# Patient Record
Sex: Female | Born: 1950 | Hispanic: No | Marital: Married | Smoking: Never smoker
Health system: Southern US, Community
[De-identification: ages and names within clinical notes are randomized; demographics above are authoritative.]

## PROBLEM LIST (undated history)

## (undated) DIAGNOSIS — E079 Disorder of thyroid, unspecified: Secondary | ICD-10-CM

## (undated) DIAGNOSIS — K760 Fatty (change of) liver, not elsewhere classified: Secondary | ICD-10-CM

## (undated) DIAGNOSIS — M5410 Radiculopathy, site unspecified: Secondary | ICD-10-CM

## (undated) DIAGNOSIS — Z9989 Dependence on other enabling machines and devices: Secondary | ICD-10-CM

## (undated) DIAGNOSIS — T8859XA Other complications of anesthesia, initial encounter: Secondary | ICD-10-CM

## (undated) DIAGNOSIS — C4491 Basal cell carcinoma of skin, unspecified: Secondary | ICD-10-CM

## (undated) DIAGNOSIS — Z9289 Personal history of other medical treatment: Secondary | ICD-10-CM

## (undated) DIAGNOSIS — G4733 Obstructive sleep apnea (adult) (pediatric): Secondary | ICD-10-CM

## (undated) DIAGNOSIS — G629 Polyneuropathy, unspecified: Secondary | ICD-10-CM

## (undated) DIAGNOSIS — K5790 Diverticulosis of intestine, part unspecified, without perforation or abscess without bleeding: Secondary | ICD-10-CM

## (undated) DIAGNOSIS — E785 Hyperlipidemia, unspecified: Secondary | ICD-10-CM

## (undated) DIAGNOSIS — I10 Essential (primary) hypertension: Secondary | ICD-10-CM

## (undated) DIAGNOSIS — D6851 Activated protein C resistance: Secondary | ICD-10-CM

## (undated) DIAGNOSIS — I6529 Occlusion and stenosis of unspecified carotid artery: Secondary | ICD-10-CM

## (undated) DIAGNOSIS — I82401 Acute embolism and thrombosis of unspecified deep veins of right lower extremity: Secondary | ICD-10-CM

## (undated) HISTORY — DX: Personal history of other medical treatment: Z92.89

## (undated) HISTORY — DX: Diverticulosis of intestine, part unspecified, without perforation or abscess without bleeding: K57.90

## (undated) HISTORY — DX: Hyperlipidemia, unspecified: E78.5

## (undated) HISTORY — DX: Activated protein C resistance: D68.51

## (undated) HISTORY — PX: APPENDECTOMY: SHX54

## (undated) HISTORY — DX: Fatty (change of) liver, not elsewhere classified: K76.0

## (undated) HISTORY — DX: Basal cell carcinoma of skin, unspecified: C44.91

## (undated) HISTORY — DX: Essential (primary) hypertension: I10

## (undated) HISTORY — DX: Obstructive sleep apnea (adult) (pediatric): G47.33

## (undated) HISTORY — PX: COLONOSCOPY: SHX174

## (undated) HISTORY — DX: Polyneuropathy, unspecified: G62.9

## (undated) HISTORY — DX: Occlusion and stenosis of unspecified carotid artery: I65.29

## (undated) HISTORY — DX: Acute embolism and thrombosis of unspecified deep veins of right lower extremity: I82.401

## (undated) HISTORY — PX: BREAST SURGERY: SHX581

## (undated) HISTORY — PX: BACK SURGERY: SHX140

## (undated) HISTORY — DX: Disorder of thyroid, unspecified: E07.9

## (undated) HISTORY — PX: OTHER SURGICAL HISTORY: SHX169

## (undated) HISTORY — DX: Dependence on other enabling machines and devices: Z99.89

---

## 2003-05-29 HISTORY — PX: BREAST EXCISIONAL BIOPSY: SUR124

## 2016-10-13 LAB — HM COLONOSCOPY

## 2017-01-10 LAB — HM HEPATITIS C SCREENING LAB: HM Hepatitis Screen: NEGATIVE

## 2017-06-20 LAB — HM MAMMOGRAPHY

## 2017-11-01 ENCOUNTER — Ambulatory Visit: Payer: Self-pay | Admitting: Internal Medicine

## 2017-11-01 ENCOUNTER — Ambulatory Visit (INDEPENDENT_AMBULATORY_CARE_PROVIDER_SITE_OTHER): Payer: Medicare Other | Admitting: Internal Medicine

## 2017-11-01 ENCOUNTER — Encounter (INDEPENDENT_AMBULATORY_CARE_PROVIDER_SITE_OTHER): Payer: Self-pay

## 2017-11-01 VITALS — BP 168/80 | HR 77 | Temp 98.3°F | Ht 65.0 in | Wt 170.0 lb

## 2017-11-01 DIAGNOSIS — Z85828 Personal history of other malignant neoplasm of skin: Secondary | ICD-10-CM | POA: Diagnosis not present

## 2017-11-01 DIAGNOSIS — M85859 Other specified disorders of bone density and structure, unspecified thigh: Secondary | ICD-10-CM

## 2017-11-01 DIAGNOSIS — D6851 Activated protein C resistance: Secondary | ICD-10-CM | POA: Diagnosis not present

## 2017-11-01 DIAGNOSIS — I739 Peripheral vascular disease, unspecified: Secondary | ICD-10-CM

## 2017-11-01 DIAGNOSIS — I779 Disorder of arteries and arterioles, unspecified: Secondary | ICD-10-CM

## 2017-11-01 DIAGNOSIS — G4733 Obstructive sleep apnea (adult) (pediatric): Secondary | ICD-10-CM | POA: Diagnosis not present

## 2017-11-01 DIAGNOSIS — Z66 Do not resuscitate: Secondary | ICD-10-CM | POA: Diagnosis not present

## 2017-11-01 DIAGNOSIS — Z1283 Encounter for screening for malignant neoplasm of skin: Secondary | ICD-10-CM | POA: Diagnosis not present

## 2017-11-01 DIAGNOSIS — M8580 Other specified disorders of bone density and structure, unspecified site: Secondary | ICD-10-CM | POA: Insufficient documentation

## 2017-11-01 DIAGNOSIS — E039 Hypothyroidism, unspecified: Secondary | ICD-10-CM | POA: Diagnosis not present

## 2017-11-01 DIAGNOSIS — Z9989 Dependence on other enabling machines and devices: Secondary | ICD-10-CM

## 2017-11-01 DIAGNOSIS — E785 Hyperlipidemia, unspecified: Secondary | ICD-10-CM | POA: Diagnosis not present

## 2017-11-01 DIAGNOSIS — M810 Age-related osteoporosis without current pathological fracture: Secondary | ICD-10-CM

## 2017-11-01 DIAGNOSIS — I10 Essential (primary) hypertension: Secondary | ICD-10-CM | POA: Diagnosis not present

## 2017-11-01 HISTORY — DX: Personal history of other malignant neoplasm of skin: Z85.828

## 2017-11-01 HISTORY — DX: Activated protein C resistance: D68.51

## 2017-11-01 NOTE — Patient Instructions (Signed)
Follow up in 6 weeks  Let me know about eye doctor referral and when you are ready for sleep study referral   Neuropathic Pain Neuropathic pain is pain caused by damage to the nerves that are responsible for certain sensations in your body (sensory nerves). The pain can be caused by damage to:  The sensory nerves that send signals to your spinal cord and brain (peripheral nervous system).  The sensory nerves in your brain or spinal cord (central nervous system).  Neuropathic pain can make you more sensitive to pain. What would be a minor sensation for most people may feel very painful if you have neuropathic pain. This is usually a long-term condition that can be difficult to treat. The type of pain can differ from person to person. It may start suddenly (acute), or it may develop slowly and last for a long time (chronic). Neuropathic pain may come and go as damaged nerves heal or may stay at the same level for years. It often causes emotional distress, loss of sleep, and a lower quality of life. What are the causes? The most common cause of damage to a sensory nerve is diabetes. Many other diseases and conditions can also cause neuropathic pain. Causes of neuropathic pain can be classified as:  Toxic. Many drugs and chemicals can cause toxic damage. The most common cause of toxic neuropathic pain is damage from drug treatment for cancer (chemotherapy).  Metabolic. This type of pain can happen when a disease causes imbalances that damage nerves. Diabetes is the most common of these diseases. Vitamin B deficiency caused by long-term alcohol abuse is another common cause.  Traumatic. Any injury that cuts, crushes, or stretches a nerve can cause damage and pain. A common example is feeling pain after losing an arm or leg (phantom limb pain).  Compression-related. If a sensory nerve gets trapped or compressed for a long period of time, the blood supply to the nerve can be cut off.  Vascular. Many  blood vessel diseases can cause neuropathic pain by decreasing blood supply and oxygen to nerves.  Autoimmune. This type of pain results from diseases in which the body's defense system mistakenly attacks sensory nerves. Examples of autoimmune diseases that can cause neuropathic pain include lupus and multiple sclerosis.  Infectious. Many types of viral infections can damage sensory nerves and cause pain. Shingles infection is a common cause of this type of pain.  Inherited. Neuropathic pain can be a symptom of many diseases that are passed down through families (genetic).  What are the signs or symptoms? The main symptom is pain. Neuropathic pain is often described as:  Burning.  Shock-like.  Stinging.  Hot or cold.  Itching.  How is this diagnosed? No single test can diagnose neuropathic pain. Your health care provider will do a physical exam and ask you about your pain. You may use a pain scale to describe how bad your pain is. You may also have tests to see if you have a high sensitivity to pain and to help find the cause and location of any sensory nerve damage. These tests may include:  Imaging studies, such as: ? X-rays. ? CT scan. ? MRI.  Nerve conduction studies to test how well nerve signals travel through your sensory nerves (electrodiagnostic testing).  Stimulating your sensory nerves through electrodes on your skin and measuring the response in your spinal cord and brain (somatosensory evoked potentials).  How is this treated? Treatment for neuropathic pain may change over time. You  may need to try different treatment options or a combination of treatments. Some options include:  Over-the-counter pain relievers.  Prescription medicines. Some medicines used to treat other conditions may also help neuropathic pain. These include medicines to: ? Control seizures (anticonvulsants). ? Relieve depression (antidepressants).  Prescription-strength pain relievers  (narcotics). These are usually used when other pain relievers do not help.  Transcutaneous nerve stimulation (TENS). This uses electrical currents to block painful nerve signals. The treatment is painless.  Topical and local anesthetics. These are medicines that numb the nerves. They can be injected as a nerve block or applied to the skin.  Alternative treatments, such as: ? Acupuncture. ? Meditation. ? Massage. ? Physical therapy. ? Pain management programs. ? Counseling.  Follow these instructions at home:  Learn as much as you can about your condition.  Take medicines only as directed by your health care provider.  Work closely with all your health care providers to find what works best for you.  Have a good support system at home.  Consider joining a chronic pain support group. Contact a health care provider if:  Your pain treatments are not helping.  You are having side effects from your medicines.  You are struggling with fatigue, mood changes, depression, or anxiety. This information is not intended to replace advice given to you by your health care provider. Make sure you discuss any questions you have with your health care provider. Document Released: 02/09/2004 Document Revised: 12/02/2015 Document Reviewed: 10/22/2013 Elsevier Interactive Patient Education  [employer].

## 2017-11-01 NOTE — Progress Notes (Signed)
Pre visit review using our clinic review tool, if applicable. No additional management support is needed unless otherwise documented below in the visit note. 

## 2017-11-04 ENCOUNTER — Encounter: Payer: Self-pay | Admitting: Internal Medicine

## 2017-11-04 NOTE — Progress Notes (Addendum)
Chief Complaint  Patient presents with  . New Patient (Initial Visit)   New patient recently moved [place] to [place] daughter works for [employer] 1. HTN not on meds per pt BP is normal at home and when she comes to MD elevated  2. OSA on cpap will need new sleep study 01/2019 3. H/o BCC, SCC face would like referral to dermatology  4. HLD declines to be on statin due to h.o of elevated lfts on statin previously  5. She requests DNR form be filled out today and has necklace with DNR wishes she shows today  6. Hypothyroidism last labs 05/2017 in [place] per pt on levo 50 mcg qd  7. H/o back surgery with residual leg weakness and flare of back pain and radicular sx's she had PM&R doctor in [place] and taking gabapentin, prn valium and norco help with flares disc with pt is norco is long term will need pain clinic   Review of Systems  Constitutional: Negative for weight loss.  HENT: Negative for hearing loss.   Eyes: Negative for blurred vision.  Respiratory: Negative for shortness of breath.   Cardiovascular: Negative for chest pain.  Gastrointestinal: Negative for abdominal pain.  Musculoskeletal: Positive for back pain.  Skin: Negative for rash.  Neurological: Positive for sensory change and focal weakness. Negative for headaches.  Psychiatric/Behavioral: Negative for depression.   Past Medical History:  Diagnosis Date  . Carotid artery stenosis    mild L>R 03/26/16 [place]   . Diverticulosis    colonoscopy 10/13/16 [place]  . Factor 5 Leiden mutation, heterozygous ([place])   . History of blood transfusion    birth Rh factor   . Hyperlipidemia   . Hypertension   . Neuropathy    post surgical right leg s/p back surgery ruptured L4/5 disc repair with microdiscetomy   . OSA on CPAP   . Right leg DVT ([place])    2010 s/p back surgery   . Thyroid disease    hypothyroidism    Past Surgical History:  Procedure Laterality Date  . APPENDECTOMY     2000  . BREAST SURGERY     biospy ? year    . ruptured disc repair     L4/5 07/5571 with complications residual right leg weakness/reduced sensation and DVT in 2010 post surgery    Family History  Problem Relation Age of Onset  . Hypertension Mother   . Stroke Mother        in 52s   . Heart disease Father   . Stroke Father   . Cancer Brother        esophageal cancer   . Stroke Paternal Grandfather   . Hyperlipidemia Other    Social History   Socioeconomic History  . Marital status: Married    Spouse name: Not on file  . Number of children: Not on file  . Years of education: Not on file  . Highest education level: Not on file  Occupational History  . Not on file  Social Needs  . Financial resource strain: Not on file  . Food insecurity:    Worry: Not on file    Inability: Not on file  . Transportation needs:    Medical: Not on file    Non-medical: Not on file  Tobacco Use  . Smoking status: Never Smoker  . Smokeless tobacco: Never Used  Substance and Sexual Activity  . Alcohol use: Yes  . Drug use: Not Currently  . Sexual activity: Not on file  Lifestyle  . Physical activity:    Days per week: Not on file    Minutes per session: Not on file  . Stress: Not on file  Relationships  . Social connections:    Talks on phone: Not on file    Gets together: Not on file    Attends religious service: Not on file    Active member of club or organization: Not on file    Attends meetings of clubs or organizations: Not on file    Relationship status: Not on file  . Intimate partner violence:    Fear of current or ex partner: Not on file    Emotionally abused: Not on file    Physically abused: Not on file    Forced sexual activity: Not on file  Other Topics Concern  . Not on file  Social History Narrative   BSN CCM, RN   Moved from [place] recently    2 daughters    Retired and married    No guns, wears seat belt, safe in relationship    Current Meds  Medication Sig  . ALPHA LIPOIC ACID PO Take 250 mg by  mouth.  . Ascorbic Acid (VITAMIN C) 1000 MG tablet Take 1,000 mg by mouth 3 (three) times daily.  Marland Kitchen aspirin EC 81 MG tablet Take 81 mg by mouth daily.  . Cholecalciferol (VITAMIN D3) 5000 units CAPS Take by mouth.  . diazepam (VALIUM) 2 MG tablet Take 2 mg by mouth as needed for anxiety.  . gabapentin (NEURONTIN) 100 MG capsule Take 200 mg by mouth 3 (three) times daily.  Marland Kitchen HYDROcodone-acetaminophen (NORCO/VICODIN) 5-325 MG tablet Take 1 tablet by mouth once a week.   . levothyroxine (SYNTHROID, LEVOTHROID) 50 MCG tablet Take 50 mcg by mouth daily before breakfast.  . MAGNESIUM MALATE PO Take 625 mg by mouth.  . nitrofurantoin (MACRODANTIN) 50 MG capsule Take 50 mg by mouth at bedtime.  . Strontium Chloride POWD 500 mg by Does not apply route.   Allergies  Allergen Reactions  . Sulfa Antibiotics     Swelling     No results found for this or any previous visit (from the past 2160 hour(s)). Objective  Body mass index is 28.29 kg/m. Wt Readings from Last 3 Encounters:  11/01/17 170 lb (77.1 kg)   Temp Readings from Last 3 Encounters:  11/01/17 98.3 F (36.8 C) (Oral)   BP Readings from Last 3 Encounters:  11/01/17 (!) 168/80   Pulse Readings from Last 3 Encounters:  11/01/17 77    Physical Exam  Constitutional: She is oriented to person, place, and time. Vital signs are normal. She appears well-developed and well-nourished. She is cooperative.  HENT:  Head: Normocephalic and atraumatic.  Mouth/Throat: Oropharynx is clear and moist and mucous membranes are normal.  Eyes: Pupils are equal, round, and reactive to light. Conjunctivae are normal.  Cardiovascular: Normal rate, regular rhythm and normal heart sounds.  Pulmonary/Chest: Effort normal and breath sounds normal.  Neurological: She is alert and oriented to person, place, and time. Gait normal.  Walking normally w/o device today   Skin: Skin is warm, dry and intact.  Psychiatric: She has a normal mood and affect. Her  speech is normal and behavior is normal. Judgment and thought content normal. Cognition and memory are normal.  Nursing note and vitals reviewed.   Assessment   1. HTN uncontrolled today - pt states she has "white coat HTN" BP readings at home 120s-130s/80s Reviewed BP log 153/93  and 143/90 and 132/86 some of readings  2. Right leg neuropathy/lumbar radiculopathy postsurgical L4/5 ruptured discectomy 10/2008 with residual complications neuropathy and leg weakness right 3. Hypothyroidism  4. H/o UTI  5. OSA on cpap  6. HLD with FH HLD  7. Heterozygous factor 5 Leiden s/p DVT right leg 10/2008 s/p back surgery  8. HM HM Plan   1.  F/in 6 weeks  Requested labs from [facility] from 05/2017 prior PCP  If elevated at f/u will rec medication  2.  Cont meds  If narcotics needed long term will need to establish with pain clinic  Prev. PM&R doctor Dr. Marchelle Gearing [place] who tx'ed with "microcurrent" which helped  Gabapentin 200 mg 3x per day. Of note did not tolerate lyrica in the past  Also uses Valium 2 mg 1/2 tablet rarely for flares prn  norco 5-325 mg 1/2 to 1 tab 1x per week uses sparingly per pt   3. Cont levo 50 mcg  4. On macrobid prn disc tx dose 100 bid x 5 days but culture is best if having UTI sx's  4. Takes macrobid 50 mg qhs prn and D mannose  5. Will need repeat sleep study 01/2019 will need referral then  6. Declines to be on statin per pt h/o elevated lfts on statin in the past  7. Pt takes Aspirin 81 mg and nattokinase and has not had further events since 2010 I.e DVT/PE Declines anticoagulation unless another DVT/PE 8.  Declines flu vaccine, prevnar, pna 23 vaccine Had zostervax 2012 not shingrix Tdap had 2018 need to check prior PCP records   Out of age window pap h/o abnormal pap in 20s but normal since then  Colonoscopy had 10/13/16 diverticulosis reviewed letter will need report in future  -Colonoscopy 10/13/16 diverticulosis [facility] Dr. Cathlean Cower fax # [phone]   Mammogram had 06/20/17 normal reviewed letter will need report in future -Mammogram 06/20/17 normal [place] fax # [phone]   DEXA (h/o osteopenia hip and osteoporosis spine) get records prior PCP  -per pt this is why taking Strontium and had helped with osteoporosis   Refer to Dr. Santiago Glad tbse h/o BCC forehead and SCC nose needs tbse Will need eye exam in the future Never smoker   Filled out DNR form today per pt request and pt wears necklace with DNR.     Of note reviewed and scanned into chart -echo 11/26/12 normal except mild sclerosis AV mild, mild TR, MV leaflet thickening   Need to get records from [place] per pt had labs 05/2017  [facility] is pharmacy   Of note pt takes supplements  -nattokinase 100 mg qd (per pt taking for h/o factor V Leiden) -vitamin D3 5000 IU qd  -tumeric 1000 mg qd  -alpha lipoic acid 250 mg qd  -magnesium malate 625 qd  -strontium 500 mg qd  -vit C1000 mg 3x per day  -aspirin     Obtained records [facility] 11/09/17 dysuria suspected UTI dip +leuks 1+ urine culture + E coli pan sensitive    "I spent 45 minutes face-to face with patient with greater than 50% of time spent counseling and/or in coordination of care, reviewing medications and records patient brought into visit and PMH/PsuH in detail.   Provider: Dr. Olivia Mackie [facility]

## 2017-11-11 ENCOUNTER — Telehealth: Payer: Self-pay | Admitting: *Deleted

## 2017-11-11 NOTE — Telephone Encounter (Signed)
Copied from [place] [id number]. Topic: Inquiry >> Nov 11, 2017  8:03 AM Pricilla Handler wrote: Reason for CRM: Patient called stating that she was seen at a [facility] over the weekend for a Bladder Infection. Patient stated that she wants Dr. Olivia Mackie to call for and review the lab results from the Lab taken by [facility] and sent to [place]. Patient is requesting that Dr. Olivia Mackie prescribe her a medication based on the lab results from [facility]. Please call the patient this morning.       Thank You!!!

## 2017-11-12 NOTE — Telephone Encounter (Signed)
Spoken to Urgent care. Lab result have not returned back.

## 2017-11-24 ENCOUNTER — Telehealth: Payer: Self-pay | Admitting: Internal Medicine

## 2017-11-24 NOTE — Telephone Encounter (Signed)
I never got the phone message from 6/17 But just reviewed the note from [facility].   she did have UTI + E coli treatable by all antibiotics   I cant treat a patient unless I evaluate the patient.   [facility] who evaluated her for UTI should have sent antibiotics   Did they?   [place]

## 2017-11-26 NOTE — Telephone Encounter (Signed)
FYI

## 2017-11-26 NOTE — Telephone Encounter (Signed)
Spoken to [facility],  They sent in antibiotic for patinet on the 20 jun2019 to replace the macropbid that gave patient diarrhea.

## 2017-11-26 NOTE — Telephone Encounter (Signed)
Patient has been notified.  She had no questions at this time.

## 2017-11-26 NOTE — Telephone Encounter (Signed)
Patient would like Fransisco Beau to call her back [phone] if it is before 3, if not then call tomorrow

## 2017-12-02 ENCOUNTER — Telehealth: Payer: Self-pay | Admitting: Internal Medicine

## 2017-12-02 NOTE — Telephone Encounter (Signed)
I did not write Rx Cefdinir 300 mg bid x 1 week  Who wrote this and for what reason am I getting the fax call [phone] or [phone] If needed call the patient

## 2017-12-10 ENCOUNTER — Emergency Department
Admission: EM | Admit: 2017-12-10 | Discharge: 2017-12-10 | Disposition: A | Discharge status: Home / Self Care | Payer: Medicare Other | Attending: Emergency Medicine | Admitting: Emergency Medicine

## 2017-12-10 ENCOUNTER — Other Ambulatory Visit: Payer: Self-pay

## 2017-12-10 DIAGNOSIS — R300 Dysuria: Secondary | ICD-10-CM | POA: Insufficient documentation

## 2017-12-10 DIAGNOSIS — Z79899 Other long term (current) drug therapy: Secondary | ICD-10-CM | POA: Diagnosis not present

## 2017-12-10 DIAGNOSIS — E039 Hypothyroidism, unspecified: Secondary | ICD-10-CM | POA: Insufficient documentation

## 2017-12-10 DIAGNOSIS — Z7982 Long term (current) use of aspirin: Secondary | ICD-10-CM | POA: Insufficient documentation

## 2017-12-10 DIAGNOSIS — I10 Essential (primary) hypertension: Secondary | ICD-10-CM | POA: Diagnosis not present

## 2017-12-10 LAB — URINALYSIS, COMPLETE (UACMP) WITH MICROSCOPIC
Bacteria, UA: NONE SEEN
Bilirubin Urine: NEGATIVE
GLUCOSE, UA: NEGATIVE mg/dL
Hgb urine dipstick: NEGATIVE
KETONES UR: 5 mg/dL — AB
Leukocytes, UA: NEGATIVE
NITRITE: NEGATIVE
PH: 5 (ref 5.0–8.0)
Protein, ur: NEGATIVE mg/dL
SPECIFIC GRAVITY, URINE: 1.005 (ref 1.005–1.030)
SQUAMOUS EPITHELIAL / LPF: NONE SEEN (ref 0–5)
WBC, UA: NONE SEEN WBC/hpf (ref 0–5)

## 2017-12-10 LAB — CBC WITH DIFFERENTIAL/PLATELET
Basophils Absolute: 0.1 10*3/uL (ref 0–0.1)
Basophils Relative: 1 %
Eosinophils Absolute: 0.1 10*3/uL (ref 0–0.7)
Eosinophils Relative: 1 %
HEMATOCRIT: 37 % (ref 35.0–47.0)
Hemoglobin: 13.3 g/dL (ref 12.0–16.0)
LYMPHS ABS: 2.5 10*3/uL (ref 1.0–3.6)
LYMPHS PCT: 20 %
MCH: 32.5 pg (ref 26.0–34.0)
MCHC: 35.8 g/dL (ref 32.0–36.0)
MCV: 90.7 fL (ref 80.0–100.0)
MONOS PCT: 6 %
Monocytes Absolute: 0.8 10*3/uL (ref 0.2–0.9)
NEUTROS ABS: 8.8 10*3/uL — AB (ref 1.4–6.5)
Neutrophils Relative %: 72 %
Platelets: 291 10*3/uL (ref 150–440)
RBC: 4.08 MIL/uL (ref 3.80–5.20)
RDW: 12.3 % (ref 11.5–14.5)
WBC: 12.3 10*3/uL — ABNORMAL HIGH (ref 3.6–11.0)

## 2017-12-10 LAB — COMPREHENSIVE METABOLIC PANEL
ALK PHOS: 75 U/L (ref 38–126)
ALT: 24 U/L (ref 0–44)
AST: 27 U/L (ref 15–41)
Albumin: 4.2 g/dL (ref 3.5–5.0)
Anion gap: 10 (ref 5–15)
BUN: 16 mg/dL (ref 8–23)
CALCIUM: 9.2 mg/dL (ref 8.9–10.3)
CO2: 21 mmol/L — AB (ref 22–32)
CREATININE: 0.66 mg/dL (ref 0.44–1.00)
Chloride: 96 mmol/L — ABNORMAL LOW (ref 98–111)
Glucose, Bld: 115 mg/dL — ABNORMAL HIGH (ref 70–99)
Potassium: 3.6 mmol/L (ref 3.5–5.1)
Sodium: 127 mmol/L — ABNORMAL LOW (ref 135–145)
Total Bilirubin: 0.8 mg/dL (ref 0.3–1.2)
Total Protein: 7.3 g/dL (ref 6.5–8.1)

## 2017-12-10 MED ORDER — PHENAZOPYRIDINE HCL 95 MG PO TABS: 95.0000 mg | ORAL_TABLET | Freq: Three times a day (TID) | ORAL | 0 refills | Status: DC | PRN

## 2017-12-10 MED ORDER — SODIUM CHLORIDE 0.9 % IV SOLN
1000.0000 mL | Freq: Once | INTRAVENOUS | Status: AC
  Administered 2017-12-10: 1000 mL via INTRAVENOUS

## 2017-12-10 NOTE — ED Provider Notes (Signed)
[facility] Emergency Department Provider Note   ____________________________________________    I have reviewed the triage vital signs and the nursing notes.   HISTORY  Chief Complaint Decreased urination    HPI Tonya Green is a 67 y.o. female presents with decreased urination and mild dysuria.  Patient reports over the last month she has been treated multiple times for urinary tract infection.  She is frustrated because she just finished a course of Ceftdinir and reports that when she urinated in a glass cup today it looked cloudy she had some mild dysuria..  She reports she had a similar episode over a decade ago where she had multiple antibiotics with little improvement.  She does report some diarrhea from the antibiotics as well.  No fevers or chills.  She is concerned and came to the emergency department today because of decreased urination.  Past Medical History:  Diagnosis Date  . Carotid artery stenosis    mild L>R 03/26/16 [place]   . Diverticulosis    colonoscopy 10/13/16 [place]  . Factor 5 Leiden mutation, heterozygous ([place])   . History of blood transfusion    birth Rh factor   . Hyperlipidemia   . Hypertension   . Neuropathy    post surgical right leg s/p back surgery ruptured L4/5 disc repair with microdiscetomy   . OSA on CPAP   . Right leg DVT ([place])    2010 s/p back surgery   . Thyroid disease    hypothyroidism     Patient Active Problem List   Diagnosis Date Noted  . OSA on CPAP 11/01/2017  . History of skin cancer 11/01/2017  . Carotid artery disease ([place]) 11/01/2017  . HTN (hypertension) 11/01/2017  . HLD (hyperlipidemia) 11/01/2017  . Heterozygous factor V Leiden mutation ([place]) 11/01/2017  . Hypothyroidism 11/01/2017  . Osteopenia 11/01/2017  . Osteoporosis 11/01/2017    Past Surgical History:  Procedure Laterality Date  . APPENDECTOMY     2000  . BREAST SURGERY     biospy ? year   . ruptured disc repair     L4/5 09/6387 with complications residual right leg weakness/reduced sensation and DVT in 2010 post surgery     Prior to Admission medications   Medication Sig Start Date End Date Taking? Authorizing Provider  ALPHA LIPOIC ACID PO Take 250 mg by mouth.    [provider]  Ascorbic Acid (VITAMIN C) 1000 MG tablet Take 1,000 mg by mouth 3 (three) times daily.    [provider]  aspirin EC 81 MG tablet Take 81 mg by mouth daily.    [provider]  Cholecalciferol (VITAMIN D3) 5000 units CAPS Take by mouth.    [provider]  diazepam (VALIUM) 2 MG tablet Take 2 mg by mouth as needed for anxiety.    [provider]  gabapentin (NEURONTIN) 100 MG capsule Take 200 mg by mouth 3 (three) times daily.    [provider]  HYDROcodone-acetaminophen (NORCO/VICODIN) 5-325 MG tablet Take 1 tablet by mouth once a week.     [provider]  levothyroxine (SYNTHROID, LEVOTHROID) 50 MCG tablet Take 50 mcg by mouth daily before breakfast.    [provider]  MAGNESIUM MALATE PO Take 625 mg by mouth.    [provider]  nitrofurantoin (MACRODANTIN) 50 MG capsule Take 50 mg by mouth at bedtime.    [provider]  phenazopyridine (PYRIDIUM) 95 MG tablet Take 1 tablet (95 mg total) by  mouth 3 (three) times daily as needed for pain. 12/10/17   Lavonia Drafts, MD  Strontium Chloride POWD 500 mg by Does not apply route.    [provider]     Allergies Macrobid [nitrofurantoin macrocrystal] and Sulfa antibiotics  Family History  Problem Relation Age of Onset  . Hypertension Mother   . Stroke Mother        in 38s   . Heart disease Father   . Stroke Father   . Cancer Brother        esophageal cancer   . Stroke Paternal Grandfather   . Hyperlipidemia Other     Social History Social History   Tobacco Use  . Smoking status: Never Smoker  . Smokeless tobacco: Never Used  Substance Use Topics  . Alcohol use:  Yes  . Drug use: Never    Review of Systems  Constitutional: No fever/chills   Gastrointestinal: No abdominal pain.  No nausea, no vomiting.   Genitourinary: As above Musculoskeletal: Negative for back pain. Skin: Negative for rash. Neurological: Negative for headaches or weakness   ____________________________________________   PHYSICAL EXAM:  VITAL SIGNS: ED Triage Vitals  Enc Vitals Group     BP 12/10/17 1129 (!) 145/73     Pulse Rate 12/10/17 1129 75     Resp 12/10/17 1129 14     Temp 12/10/17 1129 98 F (36.7 C)     Temp Source 12/10/17 1129 Oral     SpO2 12/10/17 1129 98 %     Weight 12/10/17 1125 73.5 kg (162 lb)     Height 12/10/17 1125 1.651 m (5\' 5" )     Head Circumference --      Peak Flow --      Pain Score 12/10/17 1125 5     Pain Loc --      Pain Edu? --      Excl. in [place]? --     Constitutional: Alert and oriented. No acute distress. Pleasant and interactive Eyes: Conjunctivae are normal.   Nose: No congestion/rhinnorhea. Mouth/Throat: Mucous membranes are moist.    Cardiovascular: Normal rate, regular rhythm.   Good peripheral circulation. Respiratory: Normal respiratory effort.  No retractions.  Gastrointestinal: Soft and nontender. No distention.  No CVA tenderness.  No suprapubic tenderness palpation  Musculoskeletal:   Warm and well perfused Neurologic:  Normal speech and language. No gross focal neurologic deficits are appreciated.  Skin:  Skin is warm, dry and intact. No rash noted. Psychiatric: Mood and affect are normal. Speech and behavior are normal.  ____________________________________________   LABS (all labs ordered are listed, but only abnormal results are displayed)  Labs Reviewed  COMPREHENSIVE METABOLIC PANEL - Abnormal; Notable for the following components:      Result Value   Sodium 127 (*)    Chloride 96 (*)    CO2 21 (*)    Glucose, Bld 115 (*)    All other components within normal limits  URINALYSIS, COMPLETE  (UACMP) WITH MICROSCOPIC - Abnormal; Notable for the following components:   Color, Urine STRAW (*)    APPearance CLEAR (*)    Ketones, ur 5 (*)    All other components within normal limits  CBC WITH DIFFERENTIAL/PLATELET - Abnormal; Notable for the following components:   WBC 12.3 (*)    Neutro Abs 8.8 (*)    All other components within normal limits  URINE CULTURE   ____________________________________________  EKG   ____________________________________________  RADIOLOGY   ____________________________________________   PROCEDURES  Procedure(s) performed: No  Procedures   Critical Care performed: No ____________________________________________   INITIAL IMPRESSION / ASSESSMENT AND PLAN / ED COURSE  Pertinent labs & imaging results that were available during my care of the patient were reviewed by me and considered in my medical decision making (see chart for details).  Patient well-appearing in no acute distress.  Exam is quite reassuring.  Vital signs unremarkable.  Lab work demonstrates a mildly low sodium, discussed this with the patient.  This may be related to diarrhea secondary to antibiotic usage.,  She will follow-up with her PCP.  Urinalysis is completely normal, urine culture sent.  Patient received IV fluids and urinated.  She is reassured by this.  Outpatient follow-up/referral to urology    ____________________________________________   FINAL CLINICAL IMPRESSION(S) / ED DIAGNOSES  Final diagnoses:  Dysuria        Note:  This document was prepared using Dragon voice recognition software and may include unintentional dictation errors.    Lavonia Drafts, MD 12/10/17 1420

## 2017-12-10 NOTE — ED Triage Notes (Signed)
Pt c/o UTI symptoms - she states that she started with UTI June 17th - pt was seen by [facility] and had culture done and was placed on ATB x2 - when she completed ATB she started with cloudy urine and pain and called her PCP and got another ATB - the symptoms returned and she attempted herbal remedy without results - now she has had decreased urine output

## 2017-12-11 LAB — URINE CULTURE

## 2017-12-15 ENCOUNTER — Encounter: Payer: Self-pay | Admitting: Internal Medicine

## 2017-12-16 MED ORDER — LEVOTHYROXINE SODIUM 50 MCG PO TABS: 50.0000 ug | ORAL_TABLET | Freq: Every day | ORAL | 1 refills | Status: DC

## 2017-12-16 MED ORDER — GABAPENTIN 100 MG PO CAPS: 200.0000 mg | ORAL_CAPSULE | Freq: Three times a day (TID) | ORAL | 3 refills | Status: DC

## 2017-12-18 ENCOUNTER — Encounter: Payer: Self-pay | Admitting: Internal Medicine

## 2017-12-18 ENCOUNTER — Ambulatory Visit (INDEPENDENT_AMBULATORY_CARE_PROVIDER_SITE_OTHER): Payer: Medicare Other | Admitting: Internal Medicine

## 2017-12-18 VITALS — BP 164/84 | HR 75 | Temp 98.1°F | Ht 65.0 in | Wt 166.0 lb

## 2017-12-18 DIAGNOSIS — E871 Hypo-osmolality and hyponatremia: Secondary | ICD-10-CM | POA: Diagnosis not present

## 2017-12-18 DIAGNOSIS — M5416 Radiculopathy, lumbar region: Secondary | ICD-10-CM | POA: Diagnosis not present

## 2017-12-18 DIAGNOSIS — M62838 Other muscle spasm: Secondary | ICD-10-CM

## 2017-12-18 DIAGNOSIS — I10 Essential (primary) hypertension: Secondary | ICD-10-CM | POA: Diagnosis not present

## 2017-12-18 DIAGNOSIS — D72829 Elevated white blood cell count, unspecified: Secondary | ICD-10-CM

## 2017-12-18 DIAGNOSIS — E785 Hyperlipidemia, unspecified: Secondary | ICD-10-CM

## 2017-12-18 HISTORY — DX: Hypo-osmolality and hyponatremia: E87.1

## 2017-12-18 HISTORY — DX: Elevated white blood cell count, unspecified: D72.829

## 2017-12-18 LAB — BASIC METABOLIC PANEL
BUN: 12 mg/dL (ref 6–23)
CO2: 27 mEq/L (ref 19–32)
CREATININE: 0.72 mg/dL (ref 0.40–1.20)
Calcium: 9.5 mg/dL (ref 8.4–10.5)
Chloride: 104 mEq/L (ref 96–112)
GFR: 85.64 mL/min (ref >60.00)
Glucose, Bld: 102 mg/dL — ABNORMAL HIGH (ref 70–99)
POTASSIUM: 4 meq/L (ref 3.5–5.1)
Sodium: 137 mEq/L (ref 135–145)

## 2017-12-18 MED ORDER — DIAZEPAM 2 MG PO TABS: ORAL_TABLET | ORAL | 2 refills | Status: DC

## 2017-12-18 MED ORDER — AMLODIPINE BESYLATE 2.5 MG PO TABS: 2.5000 mg | ORAL_TABLET | Freq: Every day | ORAL | 3 refills | Status: DC

## 2017-12-18 NOTE — Progress Notes (Signed)
Chief Complaint  Patient presents with  . Follow-up   F/u  1. HTN BP elevated and log at home with BP readings 118-140s/70s-80s  2. ED visit for dysuria 12/10/17 with culture suggest recollection pt states sx's better after ingesting colloidal silver and macrobid x 2 doses caused sob so stopped. Na was 127. Sxs improved and urine looked more clear so declines to have urine checked today BP in ED 12/10/17 was 145/73 reviewed with pt in reference to #1 likely has HTN suggest meds 3. HLD declines statin   Review of Systems  Constitutional: Positive for weight loss.       Down 4 lbs   Respiratory: Negative for shortness of breath.   Cardiovascular: Negative for chest pain.  Genitourinary: Negative for dysuria.   Past Medical History:  Diagnosis Date  . Carotid artery stenosis    mild L>R 03/26/16 [place]   . Diverticulosis    colonoscopy 10/13/16 [place]  . Factor 5 Leiden mutation, heterozygous ([place])   . History of blood transfusion    birth Rh factor   . Hyperlipidemia   . Hypertension   . Neuropathy    post surgical right leg s/p back surgery ruptured L4/5 disc repair with microdiscetomy   . OSA on CPAP   . Right leg DVT ([place])    2010 s/p back surgery   . Thyroid disease    hypothyroidism    Past Surgical History:  Procedure Laterality Date  . APPENDECTOMY     2000  . BREAST SURGERY     biospy ? year   . ruptured disc repair     L4/5 0/7121 with complications residual right leg weakness/reduced sensation and DVT in 2010 post surgery    Family History  Problem Relation Age of Onset  . Hypertension Mother   . Stroke Mother        in 50s   . Heart disease Father   . Stroke Father   . Cancer Brother        esophageal cancer   . Stroke Paternal Grandfather   . Hyperlipidemia Other    Social History   Socioeconomic History  . Marital status: Married    Spouse name: Not on file  . Number of children: Not on file  . Years of education: Not on file  . Highest  education level: Not on file  Occupational History  . Not on file  Social Needs  . Financial resource strain: Not on file  . Food insecurity:    Worry: Not on file    Inability: Not on file  . Transportation needs:    Medical: Not on file    Non-medical: Not on file  Tobacco Use  . Smoking status: Never Smoker  . Smokeless tobacco: Never Used  Substance and Sexual Activity  . Alcohol use: Yes  . Drug use: Never  . Sexual activity: Not on file  Lifestyle  . Physical activity:    Days per week: Not on file    Minutes per session: Not on file  . Stress: Not on file  Relationships  . Social connections:    Talks on phone: Not on file    Gets together: Not on file    Attends religious service: Not on file    Active member of club or organization: Not on file    Attends meetings of clubs or organizations: Not on file    Relationship status: Not on file  . Intimate partner violence:  Fear of current or ex partner: Not on file    Emotionally abused: Not on file    Physically abused: Not on file    Forced sexual activity: Not on file  Other Topics Concern  . Not on file  Social History Narrative   BSN CCM, RN   Moved from [place] recently    2 daughters    Retired and married    No guns, wears seat belt, safe in relationship    Current Meds  Medication Sig  . ALPHA LIPOIC ACID PO Take 250 mg by mouth.  . Ascorbic Acid (VITAMIN C) 1000 MG tablet Take 1,000 mg by mouth 3 (three) times daily.  Marland Kitchen aspirin EC 81 MG tablet Take 81 mg by mouth daily.  . Cholecalciferol (VITAMIN D3) 5000 units CAPS Take by mouth.  . diazepam (VALIUM) 2 MG tablet Take 2 mg by mouth as needed for muscle spasms.   Marland Kitchen gabapentin (NEURONTIN) 100 MG capsule Take 2 capsules (200 mg total) by mouth 3 (three) times daily.  Marland Kitchen HYDROcodone-acetaminophen (NORCO/VICODIN) 5-325 MG tablet Take 1 tablet by mouth as needed (only for flare ups).   Marland Kitchen levothyroxine (SYNTHROID, LEVOTHROID) 50 MCG tablet Take 1 tablet  (50 mcg total) by mouth daily before breakfast.  . MAGNESIUM MALATE PO Take 625 mg by mouth.  . phenazopyridine (PYRIDIUM) 95 MG tablet Take 1 tablet (95 mg total) by mouth 3 (three) times daily as needed for pain.  . Strontium Chloride POWD Use as directed 500 mg in the mouth or throat.    Allergies  Allergen Reactions  . Macrobid [Nitrofurantoin Macrocrystal]   . Sulfa Antibiotics     Swelling     Recent Results (from the past 2160 hour(s))  Comprehensive metabolic panel     Status: Abnormal   Collection Time: 12/10/17 11:33 AM  Result Value Ref Range   Sodium 127 (L) 135 - 145 mmol/L   Potassium 3.6 3.5 - 5.1 mmol/L   Chloride 96 (L) 98 - 111 mmol/L    Comment: Please note change in reference range.   CO2 21 (L) 22 - 32 mmol/L   Glucose, Bld 115 (H) 70 - 99 mg/dL    Comment: Please note change in reference range.   BUN 16 8 - 23 mg/dL    Comment: Please note change in reference range.   Creatinine, Ser 0.66 0.44 - 1.00 mg/dL   Calcium 9.2 8.9 - 10.3 mg/dL   Total Protein 7.3 6.5 - 8.1 g/dL   Albumin 4.2 3.5 - 5.0 g/dL   AST 27 15 - 41 U/L   ALT 24 0 - 44 U/L    Comment: Please note change in reference range.   Alkaline Phosphatase 75 38 - 126 U/L   Total Bilirubin 0.8 0.3 - 1.2 mg/dL   GFR calc non Af Amer >60 >60 mL/min   GFR calc Af Amer >60 >60 mL/min    Comment: (NOTE) The eGFR has been calculated using the CKD EPI equation. This calculation has not been validated in all clinical situations. eGFR's persistently <60 mL/min signify possible Chronic Kidney Disease.    Anion gap 10 5 - 15    Comment: Performed at [facility], [address] [place], [place] [address]  CBC with Differential     Status: Abnormal   Collection Time: 12/10/17 11:33 AM  Result Value Ref Range   WBC 12.3 (H) 3.6 - 11.0 K/uL   RBC 4.08 3.80 - 5.20 MIL/uL  Hemoglobin 13.3 12.0 - 16.0 g/dL   HCT 37.0 35.0 - 47.0 %   MCV 90.7 80.0 - 100.0 fL   MCH 32.5 26.0 - 34.0 pg   MCHC  35.8 32.0 - 36.0 g/dL   RDW 12.3 11.5 - 14.5 %   Platelets 291 150 - 440 K/uL   Neutrophils Relative % 72 %   Neutro Abs 8.8 (H) 1.4 - 6.5 K/uL   Lymphocytes Relative 20 %   Lymphs Abs 2.5 1.0 - 3.6 K/uL   Monocytes Relative 6 %   Monocytes Absolute 0.8 0.2 - 0.9 K/uL   Eosinophils Relative 1 %   Eosinophils Absolute 0.1 0 - 0.7 K/uL   Basophils Relative 1 %   Basophils Absolute 0.1 0 - 0.1 K/uL    Comment: Performed at [facility], [address] [place], [place] [address]  Urinalysis, Complete w Microscopic     Status: Abnormal   Collection Time: 12/10/17 12:23 PM  Result Value Ref Range   Color, Urine STRAW (A) YELLOW   APPearance CLEAR (A) CLEAR   Specific Gravity, Urine 1.005 1.005 - 1.030   pH 5.0 5.0 - 8.0   Glucose, UA NEGATIVE NEGATIVE mg/dL   Hgb urine dipstick NEGATIVE NEGATIVE   Bilirubin Urine NEGATIVE NEGATIVE   Ketones, ur 5 (A) NEGATIVE mg/dL   Protein, ur NEGATIVE NEGATIVE mg/dL   Nitrite NEGATIVE NEGATIVE   Leukocytes, UA NEGATIVE NEGATIVE   WBC, UA NONE SEEN 0 - 5 WBC/hpf   Bacteria, UA NONE SEEN NONE SEEN   Squamous Epithelial / LPF NONE SEEN 0 - 5    Comment: Performed at [facility], [address], [place], [place] [address]  Urine Culture     Status: Abnormal   Collection Time: 12/10/17  1:02 PM  Result Value Ref Range   Specimen Description      URINE, RANDOM Performed at [facility], [address], [place], [place] [address]    Special Requests      NONE Performed at [facility], [address] [place],  [address]    Culture MULTIPLE SPECIES PRESENT, SUGGEST RECOLLECTION (A)    Report Status 12/11/2017 FINAL    Objective  Body mass index is 27.62 kg/m. Wt Readings from Last 3 Encounters:  12/18/17 166 lb (75.3 kg)  12/10/17 162 lb (73.5 kg)  11/01/17 170 lb (77.1 kg)   Temp Readings from Last 3 Encounters:  12/18/17 98.1 F (36.7 C) (Oral)  12/10/17 98 F (36.7 C) (Oral)   11/01/17 98.3 F (36.8 C) (Oral)   BP Readings from Last 3 Encounters:  12/18/17 (!) 164/84  12/10/17 123/62  11/01/17 (!) 168/80   Pulse Readings from Last 3 Encounters:  12/18/17 75  12/10/17 63  11/01/17 77    Physical Exam  Constitutional: She is oriented to person, place, and time. She appears well-developed and well-nourished. She is cooperative.  HENT:  Head: Normocephalic and atraumatic.  Mouth/Throat: Oropharynx is clear and moist and mucous membranes are normal.  Eyes: Pupils are equal, round, and reactive to light. Conjunctivae are normal.  Cardiovascular: Normal rate, regular rhythm and normal heart sounds.  Pulmonary/Chest: Effort normal and breath sounds normal.  Neurological: She is alert and oriented to person, place, and time. Gait normal.  Skin: Skin is warm, dry and intact.  Psychiatric: She has a normal mood and affect. Her speech is normal and behavior is normal. Judgment and thought content normal. Cognition and memory are normal.  Nursing note and vitals reviewed.   Assessment   1. HTN/HLD 2. Dysuria  3. HM 4. hypoNa Plan  1. Add norvasc 2.5 mg qd log BP  Declines repeat lipid  2. Declines to repeat UA and culture today  3.  Declines flu vaccine, prevnar, pna 23 vaccine Had zostervax 2012 not shingrix Tdap had 2018 need to check prior PCP records   Out of age window pap h/o abnormal pap in 20s but normal since then  Colonoscopy had 10/13/16 diverticulosis reviewed letter will need report in future  -Colonoscopy 10/13/16 diverticulosis [facility] Dr. Cathlean Cower fax # [phone]   Mammogram had 06/20/17 normal reviewed letter will need report in future -Mammogram 06/20/17 normal [place] fax # [phone]   DEXA (h/o osteopenia hip and osteoporosis spine) get records prior PCP  -per pt this is why taking Strontium 5000 mg qd and had helped with osteoporosis improved to osteopenia over 3 years    Refer to Dr. Santiago Glad tbse h/o BCC forehead and SCC nose needs tbse appt sch 01/2018  Will need eye exam in the future Never smoker   Filled out DNR form today per pt request and pt wears necklace with DNR.     Of note reviewed and scanned into chart -echo 11/26/12 normal except mild sclerosis AV mild, mild TR, MV leaflet thickening   Declines TSH, vitamin D, lipid check and urine repeat check   Of note refilled Valium for muscle spasms   Need to get records from [place] per pt had labs 05/2017  [facility] is pharmacy   Of note pt takes supplements  -nattokinase 100 mg qd (per pt taking for h/o factor V Leiden) -vitamin D3 5000 IU qd  -tumeric 1000 mg qd  -alpha lipoic acid 250 mg qd  -magnesium malate 625 qd  -strontium 500 mg qd  -vit C1000 mg 3x per day  -aspirin     Obtained records [facility] 11/09/17 dysuria suspected UTI dip +leuks 1+ urine culture + E coli pan sensitive      4. bmet today if Na still low rec further w/u labs and urine and CXR  Declines statin  Provider: Dr. Olivia Mackie McLean-Scocuzza-Internal Medicine

## 2017-12-18 NOTE — Progress Notes (Signed)
Pre visit review using our clinic review tool, if applicable. No additional management support is needed unless otherwise documented below in the visit note. 

## 2017-12-18 NOTE — Patient Instructions (Addendum)
Beets, Hibiscus  F/u 4-6 weeks   Amlodipine tablets What is this medicine? AMLODIPINE (am LOE di peen) is a calcium-channel blocker. It affects the amount of calcium found in your heart and muscle cells. This relaxes your blood vessels, which can reduce the amount of work the heart has to do. This medicine is used to lower high blood pressure. It is also used to prevent chest pain. This medicine may be used for other purposes; ask your health care provider or pharmacist if you have questions. COMMON BRAND NAME(S): Norvasc What should I tell my health care provider before I take this medicine? They need to know if you have any of these conditions: -heart problems like heart failure or aortic stenosis -liver disease -an unusual or allergic reaction to amlodipine, other medicines, foods, dyes, or preservatives -pregnant or trying to get pregnant -breast-feeding How should I use this medicine? Take this medicine by mouth with a glass of water. Follow the directions on the prescription label. Take your medicine at regular intervals. Do not take more medicine than directed. Talk to your pediatrician regarding the use of this medicine in children. Special care may be needed. This medicine has been used in children as young as 6. Persons over 23 years old may have a stronger reaction to this medicine and need smaller doses. Overdosage: If you think you have taken too much of this medicine contact a poison control center or emergency room at once. NOTE: This medicine is only for you. Do not share this medicine with others. What if I miss a dose? If you miss a dose, take it as soon as you can. If it is almost time for your next dose, take only that dose. Do not take double or extra doses. What may interact with this medicine? -herbal or dietary supplements -local or general anesthetics -medicines for high blood pressure -medicines for prostate problems -rifampin This list may not describe all  possible interactions. Give your health care provider a list of all the medicines, herbs, non-prescription drugs, or dietary supplements you use. Also tell them if you smoke, drink alcohol, or use illegal drugs. Some items may interact with your medicine. What should I watch for while using this medicine? Visit your doctor or health care professional for regular check ups. Check your blood pressure and pulse rate regularly. Ask your health care professional what your blood pressure and pulse rate should be, and when you should contact him or her. This medicine may make you feel confused, dizzy or lightheaded. Do not drive, use machinery, or do anything that needs mental alertness until you know how this medicine affects you. To reduce the risk of dizzy or fainting spells, do not sit or stand up quickly, especially if you are an older patient. Avoid alcoholic drinks; they can make you more dizzy. Do not suddenly stop taking amlodipine. Ask your doctor or health care professional how you can gradually reduce the dose. What side effects may I notice from receiving this medicine? Side effects that you should report to your doctor or health care professional as soon as possible: -allergic reactions like skin rash, itching or hives, swelling of the face, lips, or tongue -breathing problems -changes in vision or hearing -chest pain -fast, irregular heartbeat -swelling of legs or ankles Side effects that usually do not require medical attention (report to your doctor or health care professional if they continue or are bothersome): -dry mouth -facial flushing -nausea, vomiting -stomach gas, pain -tired, weak -trouble sleeping  This list may not describe all possible side effects. Call your doctor for medical advice about side effects. You may report side effects to FDA at 1-800-FDA-1088. Where should I keep my medicine? Keep out of the reach of children. Store at room temperature between 59 and 86  degrees F (15 and 30 degrees C). Protect from light. Keep container tightly closed. Throw away any unused medicine after the expiration date. NOTE: This sheet is a summary. It may not cover all possible information. If you have questions about this medicine, talk to your doctor, pharmacist, or health care provider.  2018 Elsevier/Gold Standard (2012-04-11 11:40:58)   DASH Eating Plan DASH stands for "Dietary Approaches to Stop Hypertension." The DASH eating plan is a healthy eating plan that has been shown to reduce high blood pressure (hypertension). It may also reduce your risk for type 2 diabetes, heart disease, and stroke. The DASH eating plan may also help with weight loss. What are tips for following this plan? General guidelines  Avoid eating more than 2,300 mg (milligrams) of salt (sodium) a day. If you have hypertension, you may need to reduce your sodium intake to 1,500 mg a day.  Limit alcohol intake to no more than 1 drink a day for nonpregnant women and 2 drinks a day for men. One drink equals 12 oz of beer, 5 oz of wine, or 1 oz of hard liquor.  Work with your health care provider to maintain a healthy body weight or to lose weight. Ask what an ideal weight is for you.  Get at least 30 minutes of exercise that causes your heart to beat faster (aerobic exercise) most days of the week. Activities may include walking, swimming, or biking.  Work with your health care provider or diet and nutrition specialist (dietitian) to adjust your eating plan to your individual calorie needs. Reading food labels  Check food labels for the amount of sodium per serving. Choose foods with less than 5 percent of the Daily Value of sodium. Generally, foods with less than 300 mg of sodium per serving fit into this eating plan.  To find whole grains, look for the word "whole" as the first word in the ingredient list. Shopping  Buy products labeled as "low-sodium" or "no salt added."  Buy fresh  foods. Avoid canned foods and premade or frozen meals. Cooking  Avoid adding salt when cooking. Use salt-free seasonings or herbs instead of table salt or sea salt. Check with your health care provider or pharmacist before using salt substitutes.  Do not fry foods. Cook foods using healthy methods such as baking, boiling, grilling, and broiling instead.  Cook with heart-healthy oils, such as olive, canola, soybean, or sunflower oil. Meal planning   Eat a balanced diet that includes: ? 5 or more servings of fruits and vegetables each day. At each meal, try to fill half of your plate with fruits and vegetables. ? Up to 6-8 servings of whole grains each day. ? Less than 6 oz of lean meat, poultry, or fish each day. A 3-oz serving of meat is about the same size as a deck of cards. One egg equals 1 oz. ? 2 servings of low-fat dairy each day. ? A serving of nuts, seeds, or beans 5 times each week. ? Heart-healthy fats. Healthy fats called Omega-3 fatty acids are found in foods such as flaxseeds and coldwater fish, like sardines, salmon, and mackerel.  Limit how much you eat of the following: ? Canned or prepackaged  foods. ? Food that is high in trans fat, such as fried foods. ? Food that is high in saturated fat, such as fatty meat. ? Sweets, desserts, sugary drinks, and other foods with added sugar. ? Full-fat dairy products.  Do not salt foods before eating.  Try to eat at least 2 vegetarian meals each week.  Eat more home-cooked food and less restaurant, buffet, and fast food.  When eating at a restaurant, ask that your food be prepared with less salt or no salt, if possible. What foods are recommended? The items listed may not be a complete list. Talk with your dietitian about what dietary choices are best for you. Grains Whole-grain or whole-wheat bread. Whole-grain or whole-wheat pasta. Brown rice. [place]. Bulgur. Whole-grain and low-sodium cereals. Pita bread. Low-fat,  low-sodium crackers. Whole-wheat flour tortillas. Vegetables Fresh or frozen vegetables (raw, steamed, roasted, or grilled). Low-sodium or reduced-sodium tomato and vegetable juice. Low-sodium or reduced-sodium tomato sauce and tomato paste. Low-sodium or reduced-sodium canned vegetables. Fruits All fresh, dried, or frozen fruit. Canned fruit in natural juice (without added sugar). Meat and other protein foods Skinless chicken or [place]. Ground chicken or [place]. Pork with fat trimmed off. Fish and seafood. Egg whites. Dried beans, peas, or lentils. Unsalted nuts, nut butters, and seeds. Unsalted canned beans. Lean cuts of beef with fat trimmed off. Low-sodium, lean deli meat. Dairy Low-fat (1%) or fat-free (skim) milk. Fat-free, low-fat, or reduced-fat cheeses. Nonfat, low-sodium ricotta or cottage cheese. Low-fat or nonfat yogurt. Low-fat, low-sodium cheese. Fats and oils Soft margarine without trans fats. Vegetable oil. Low-fat, reduced-fat, or light mayonnaise and salad dressings (reduced-sodium). Canola, safflower, olive, soybean, and sunflower oils. Avocado. Seasoning and other foods Herbs. Spices. Seasoning mixes without salt. Unsalted popcorn and pretzels. Fat-free sweets. What foods are not recommended? The items listed may not be a complete list. Talk with your dietitian about what dietary choices are best for you. Grains Baked goods made with fat, such as croissants, muffins, or some breads. Dry pasta or rice meal packs. Vegetables Creamed or fried vegetables. Vegetables in a cheese sauce. Regular canned vegetables (not low-sodium or reduced-sodium). Regular canned tomato sauce and paste (not low-sodium or reduced-sodium). Regular tomato and vegetable juice (not low-sodium or reduced-sodium). Angie Fava. Olives. Fruits Canned fruit in a light or heavy syrup. Fried fruit. Fruit in cream or butter sauce. Meat and other protein foods Fatty cuts of meat. Ribs. Fried meat. Berniece Salines. Sausage.  Bologna and other processed lunch meats. Salami. Fatback. Hotdogs. Bratwurst. Salted nuts and seeds. Canned beans with added salt. Canned or smoked fish. Whole eggs or egg yolks. Chicken or [place] with skin. Dairy Whole or 2% milk, cream, and half-and-half. Whole or full-fat cream cheese. Whole-fat or sweetened yogurt. Full-fat cheese. Nondairy creamers. Whipped toppings. Processed cheese and cheese spreads. Fats and oils Butter. Stick margarine. Lard. Shortening. Ghee. Bacon fat. Tropical oils, such as coconut, palm kernel, or palm oil. Seasoning and other foods Salted popcorn and pretzels. Onion salt, garlic salt, seasoned salt, table salt, and sea salt. Worcestershire sauce. Tartar sauce. Barbecue sauce. Teriyaki sauce. Soy sauce, including reduced-sodium. Steak sauce. Canned and packaged gravies. Fish sauce. Oyster sauce. Cocktail sauce. Horseradish that you find on the shelf. Ketchup. Mustard. Meat flavorings and tenderizers. Bouillon cubes. Hot sauce and Tabasco sauce. Premade or packaged marinades. Premade or packaged taco seasonings. Relishes. Regular salad dressings. Where to find more information:  National Heart, Lung, and [place]: https://wilson-eaton.com/  American Heart Association: www.heart.org Summary  The DASH eating  plan is a healthy eating plan that has been shown to reduce high blood pressure (hypertension). It may also reduce your risk for type 2 diabetes, heart disease, and stroke.  With the DASH eating plan, you should limit salt (sodium) intake to 2,300 mg a day. If you have hypertension, you may need to reduce your sodium intake to 1,500 mg a day.  When on the DASH eating plan, aim to eat more fresh fruits and vegetables, whole grains, lean proteins, low-fat dairy, and heart-healthy fats.  Work with your health care provider or diet and nutrition specialist (dietitian) to adjust your eating plan to your individual calorie needs. This information is not intended to  replace advice given to you by your health care provider. Make sure you discuss any questions you have with your health care provider. Document Released: 05/03/2011 Document Revised: 05/07/2016 Document Reviewed: 05/07/2016 Elsevier Interactive Patient Education  2018 [employer].  Hypertension Hypertension, commonly called high blood pressure, is when the force of blood pumping through the arteries is too strong. The arteries are the blood vessels that carry blood from the heart throughout the body. Hypertension forces the heart to work harder to pump blood and may cause arteries to become narrow or stiff. Having untreated or uncontrolled hypertension can cause heart attacks, strokes, kidney disease, and other problems. A blood pressure reading consists of a higher number over a lower number. Ideally, your blood pressure should be below 120/80. The first ("top") number is called the systolic pressure. It is a measure of the pressure in your arteries as your heart beats. The second ("bottom") number is called the diastolic pressure. It is a measure of the pressure in your arteries as the heart relaxes. What are the causes? The cause of this condition is not known. What increases the risk? Some risk factors for high blood pressure are under your control. Others are not. Factors you can change  Smoking.  Having type 2 diabetes mellitus, high cholesterol, or both.  Not getting enough exercise or physical activity.  Being overweight.  Having too much fat, sugar, calories, or salt (sodium) in your diet.  Drinking too much alcohol. Factors that are difficult or impossible to change  Having chronic kidney disease.  Having a family history of high blood pressure.  Age. Risk increases with age.  Race. You may be at higher risk if you are African-American.  Gender. Men are at higher risk than women before age 88. After age 46, women are at higher risk than men.  Having obstructive sleep  apnea.  Stress. What are the signs or symptoms? Extremely high blood pressure (hypertensive crisis) may cause:  Headache.  Anxiety.  Shortness of breath.  Nosebleed.  Nausea and vomiting.  Severe chest pain.  Jerky movements you cannot control (seizures).  How is this diagnosed? This condition is diagnosed by measuring your blood pressure while you are seated, with your arm resting on a surface. The cuff of the blood pressure monitor will be placed directly against the skin of your upper arm at the level of your heart. It should be measured at least twice using the same arm. Certain conditions can cause a difference in blood pressure between your right and left arms. Certain factors can cause blood pressure readings to be lower or higher than normal (elevated) for a short period of time:  When your blood pressure is higher when you are in a health care provider's office than when you are at home, this is called  white coat hypertension. Most people with this condition do not need medicines.  When your blood pressure is higher at home than when you are in a health care provider's office, this is called masked hypertension. Most people with this condition may need medicines to control blood pressure.  If you have a high blood pressure reading during one visit or you have normal blood pressure with other risk factors:  You may be asked to return on a different day to have your blood pressure checked again.  You may be asked to monitor your blood pressure at home for 1 week or longer.  If you are diagnosed with hypertension, you may have other blood or imaging tests to help your health care provider understand your overall risk for other conditions. How is this treated? This condition is treated by making healthy lifestyle changes, such as eating healthy foods, exercising more, and reducing your alcohol intake. Your health care provider may prescribe medicine if lifestyle changes are  not enough to get your blood pressure under control, and if:  Your systolic blood pressure is above 130.  Your diastolic blood pressure is above 80.  Your personal target blood pressure may vary depending on your medical conditions, your age, and other factors. Follow these instructions at home: Eating and drinking  Eat a diet that is high in fiber and potassium, and low in sodium, added sugar, and fat. An example eating plan is called the DASH (Dietary Approaches to Stop Hypertension) diet. To eat this way: ? Eat plenty of fresh fruits and vegetables. Try to fill half of your plate at each meal with fruits and vegetables. ? Eat whole grains, such as whole wheat pasta, brown rice, or whole grain bread. Fill about one quarter of your plate with whole grains. ? Eat or drink low-fat dairy products, such as skim milk or low-fat yogurt. ? Avoid fatty cuts of meat, processed or cured meats, and poultry with skin. Fill about one quarter of your plate with lean proteins, such as fish, chicken without skin, beans, eggs, and tofu. ? Avoid premade and processed foods. These tend to be higher in sodium, added sugar, and fat.  Reduce your daily sodium intake. Most people with hypertension should eat less than 1,500 mg of sodium a day.  Limit alcohol intake to no more than 1 drink a day for nonpregnant women and 2 drinks a day for men. One drink equals 12 oz of beer, 5 oz of wine, or 1 oz of hard liquor. Lifestyle  Work with your health care provider to maintain a healthy body weight or to lose weight. Ask what an ideal weight is for you.  Get at least 30 minutes of exercise that causes your heart to beat faster (aerobic exercise) most days of the week. Activities may include walking, swimming, or biking.  Include exercise to strengthen your muscles (resistance exercise), such as pilates or lifting weights, as part of your weekly exercise routine. Try to do these types of exercises for 30 minutes at  least 3 days a week.  Do not use any products that contain nicotine or tobacco, such as cigarettes and e-cigarettes. If you need help quitting, ask your health care provider.  Monitor your blood pressure at home as told by your health care provider.  Keep all follow-up visits as told by your health care provider. This is important. Medicines  Take over-the-counter and prescription medicines only as told by your health care provider. Follow directions carefully. Blood pressure  medicines must be taken as prescribed.  Do not skip doses of blood pressure medicine. Doing this puts you at risk for problems and can make the medicine less effective.  Ask your health care provider about side effects or reactions to medicines that you should watch for. Contact a health care provider if:  You think you are having a reaction to a medicine you are taking.  You have headaches that keep coming back (recurring).  You feel dizzy.  You have swelling in your ankles.  You have trouble with your vision. Get help right away if:  You develop a severe headache or confusion.  You have unusual weakness or numbness.  You feel faint.  You have severe pain in your chest or abdomen.  You vomit repeatedly.  You have trouble breathing. Summary  Hypertension is when the force of blood pumping through your arteries is too strong. If this condition is not controlled, it may put you at risk for serious complications.  Your personal target blood pressure may vary depending on your medical conditions, your age, and other factors. For most people, a normal blood pressure is less than 120/80.  Hypertension is treated with lifestyle changes, medicines, or a combination of both. Lifestyle changes include weight loss, eating a healthy, low-sodium diet, exercising more, and limiting alcohol. This information is not intended to replace advice given to you by your health care provider. Make sure you discuss any  questions you have with your health care provider. Document Released: 05/14/2005 Document Revised: 04/11/2016 Document Reviewed: 04/11/2016 Elsevier Interactive Patient Education  [employer].

## 2017-12-26 ENCOUNTER — Encounter: Payer: Self-pay | Admitting: Internal Medicine

## 2017-12-26 ENCOUNTER — Other Ambulatory Visit: Payer: Self-pay | Admitting: Internal Medicine

## 2017-12-26 DIAGNOSIS — R300 Dysuria: Secondary | ICD-10-CM

## 2017-12-27 ENCOUNTER — Encounter: Payer: Self-pay | Admitting: Internal Medicine

## 2017-12-27 ENCOUNTER — Other Ambulatory Visit: Payer: Medicare Other

## 2017-12-27 ENCOUNTER — Other Ambulatory Visit: Payer: Self-pay | Admitting: Internal Medicine

## 2017-12-27 DIAGNOSIS — R300 Dysuria: Secondary | ICD-10-CM

## 2017-12-27 NOTE — Addendum Note (Signed)
Addended by: Arby Barrette on: 12/27/2017 11:15 AM   Modules accepted: Orders

## 2017-12-27 NOTE — Addendum Note (Signed)
Addended by: Arby Barrette on: 12/27/2017 11:16 AM   Modules accepted: Orders

## 2017-12-28 LAB — URINALYSIS, ROUTINE W REFLEX MICROSCOPIC
BILIRUBIN UA: NEGATIVE
GLUCOSE, UA: NEGATIVE
KETONES UA: NEGATIVE
Leukocytes, UA: NEGATIVE
NITRITE UA: NEGATIVE
Protein, UA: NEGATIVE
RBC UA: NEGATIVE
SPEC GRAV UA: 1.01 (ref 1.005–1.030)
UUROB: 0.2 mg/dL (ref 0.2–1.0)
pH, UA: 6.5 (ref 5.0–7.5)

## 2017-12-28 LAB — URINE CULTURE
MICRO NUMBER:: 90916128
RESULT: NO GROWTH
SPECIMEN QUALITY:: ADEQUATE

## 2017-12-30 ENCOUNTER — Other Ambulatory Visit: Payer: Medicare Other

## 2017-12-30 ENCOUNTER — Encounter: Payer: Self-pay | Admitting: *Deleted

## 2018-01-29 ENCOUNTER — Ambulatory Visit (INDEPENDENT_AMBULATORY_CARE_PROVIDER_SITE_OTHER): Payer: Medicare Other | Admitting: Internal Medicine

## 2018-01-29 ENCOUNTER — Encounter: Payer: Self-pay | Admitting: Internal Medicine

## 2018-01-29 VITALS — BP 156/78 | HR 75 | Temp 98.5°F | Ht 65.0 in | Wt 170.2 lb

## 2018-01-29 DIAGNOSIS — I779 Disorder of arteries and arterioles, unspecified: Secondary | ICD-10-CM

## 2018-01-29 DIAGNOSIS — E785 Hyperlipidemia, unspecified: Secondary | ICD-10-CM | POA: Diagnosis not present

## 2018-01-29 DIAGNOSIS — I10 Essential (primary) hypertension: Secondary | ICD-10-CM

## 2018-01-29 DIAGNOSIS — I739 Peripheral vascular disease, unspecified: Secondary | ICD-10-CM

## 2018-01-29 MED ORDER — CARVEDILOL 6.25 MG PO TABS: 6.2500 mg | ORAL_TABLET | Freq: Two times a day (BID) | ORAL | 2 refills | Status: DC

## 2018-01-29 NOTE — Progress Notes (Signed)
Pre visit review using our clinic review tool, if applicable. No additional management support is needed unless otherwise documented below in the visit note. 

## 2018-01-29 NOTE — Patient Instructions (Addendum)
Call back in 2 weeks let me know about blood pressure  F/u 4 months sooner if needed   Cholesterol Cholesterol is a white, waxy, fat-like substance that is needed by the human body in small amounts. The liver makes all the cholesterol we need. Cholesterol is carried from the liver by the blood through the blood vessels. Deposits of cholesterol (plaques) may build up on blood vessel (artery) walls. Plaques make the arteries narrower and stiffer. Cholesterol plaques increase the risk for heart attack and stroke. You cannot feel your cholesterol level even if it is very high. The only way to know that it is high is to have a blood test. Once you know your cholesterol levels, you should keep a record of the test results. Work with your health care provider to keep your levels in the desired range. What do the results mean?  Total cholesterol is a rough measure of all the cholesterol in your blood.  LDL (low-density lipoprotein) is the "bad" cholesterol. This is the type that causes plaque to build up on the artery walls. You want this level to be low.  HDL (high-density lipoprotein) is the "good" cholesterol because it cleans the arteries and carries the LDL away. You want this level to be high.  Triglycerides are fat that the body can either burn for energy or store. High levels are closely linked to heart disease. What are the desired levels of cholesterol?  Total cholesterol below 200.  LDL below 100 for people who are at risk, below 70 for people at very high risk.  HDL above 40 is good. A level of 60 or higher is considered to be protective against heart disease.  Triglycerides below 150. How can I lower my cholesterol? Diet Follow your diet program as told by your health care provider.  Choose fish or white meat chicken and [place], roasted or baked. Limit fatty cuts of red meat, fried foods, and processed meats, such as sausage and lunch meats.  Eat lots of fresh fruits and  vegetables.  Choose whole grains, beans, pasta, potatoes, and cereals.  Choose olive oil, corn oil, or canola oil, and use only small amounts.  Avoid butter, mayonnaise, shortening, or palm kernel oils.  Avoid foods with trans fats.  Drink skim or nonfat milk and eat low-fat or nonfat yogurt and cheeses. Avoid whole milk, cream, ice cream, egg yolks, and full-fat cheeses.  Healthier desserts include angel food cake, ginger snaps, animal crackers, hard candy, popsicles, and low-fat or nonfat frozen yogurt. Avoid pastries, cakes, pies, and cookies.  Exercise  Follow your exercise program as told by your health care provider. A regular program: ? Helps to decrease LDL and raise HDL. ? Helps with weight control.  Do things that increase your activity level, such as gardening, walking, and taking the stairs.  Ask your health care provider about ways that you can be more active in your daily life.  Medicine  Take over-the-counter and prescription medicines only as told by your health care provider. ? Medicine may be prescribed by your health care provider to help lower cholesterol and decrease the risk for heart disease. This is usually done if diet and exercise have failed to bring down cholesterol levels. ? If you have several risk factors, you may need medicine even if your levels are normal.  This information is not intended to replace advice given to you by your health care provider. Make sure you discuss any questions you have with your health care  provider. Document Released: 02/06/2001 Document Revised: 12/10/2015 Document Reviewed: 11/12/2015 Elsevier Interactive Patient Education  2018 [employer].  Hypertension Hypertension, commonly called high blood pressure, is when the force of blood pumping through the arteries is too strong. The arteries are the blood vessels that carry blood from the heart throughout the body. Hypertension forces the heart to work harder to pump blood  and may cause arteries to become narrow or stiff. Having untreated or uncontrolled hypertension can cause heart attacks, strokes, kidney disease, and other problems. A blood pressure reading consists of a higher number over a lower number. Ideally, your blood pressure should be below 120/80. The first ("top") number is called the systolic pressure. It is a measure of the pressure in your arteries as your heart beats. The second ("bottom") number is called the diastolic pressure. It is a measure of the pressure in your arteries as the heart relaxes. What are the causes? The cause of this condition is not known. What increases the risk? Some risk factors for high blood pressure are under your control. Others are not. Factors you can change  Smoking.  Having type 2 diabetes mellitus, high cholesterol, or both.  Not getting enough exercise or physical activity.  Being overweight.  Having too much fat, sugar, calories, or salt (sodium) in your diet.  Drinking too much alcohol. Factors that are difficult or impossible to change  Having chronic kidney disease.  Having a family history of high blood pressure.  Age. Risk increases with age.  Race. You may be at higher risk if you are African-American.  Gender. Men are at higher risk than women before age 44. After age 23, women are at higher risk than men.  Having obstructive sleep apnea.  Stress. What are the signs or symptoms? Extremely high blood pressure (hypertensive crisis) may cause:  Headache.  Anxiety.  Shortness of breath.  Nosebleed.  Nausea and vomiting.  Severe chest pain.  Jerky movements you cannot control (seizures).  How is this diagnosed? This condition is diagnosed by measuring your blood pressure while you are seated, with your arm resting on a surface. The cuff of the blood pressure monitor will be placed directly against the skin of your upper arm at the level of your heart. It should be measured at  least twice using the same arm. Certain conditions can cause a difference in blood pressure between your right and left arms. Certain factors can cause blood pressure readings to be lower or higher than normal (elevated) for a short period of time:  When your blood pressure is higher when you are in a health care provider's office than when you are at home, this is called white coat hypertension. Most people with this condition do not need medicines.  When your blood pressure is higher at home than when you are in a health care provider's office, this is called masked hypertension. Most people with this condition may need medicines to control blood pressure.  If you have a high blood pressure reading during one visit or you have normal blood pressure with other risk factors:  You may be asked to return on a different day to have your blood pressure checked again.  You may be asked to monitor your blood pressure at home for 1 week or longer.  If you are diagnosed with hypertension, you may have other blood or imaging tests to help your health care provider understand your overall risk for other conditions. How is this treated? This condition  is treated by making healthy lifestyle changes, such as eating healthy foods, exercising more, and reducing your alcohol intake. Your health care provider may prescribe medicine if lifestyle changes are not enough to get your blood pressure under control, and if:  Your systolic blood pressure is above 130.  Your diastolic blood pressure is above 80.  Your personal target blood pressure may vary depending on your medical conditions, your age, and other factors. Follow these instructions at home: Eating and drinking  Eat a diet that is high in fiber and potassium, and low in sodium, added sugar, and fat. An example eating plan is called the DASH (Dietary Approaches to Stop Hypertension) diet. To eat this way: ? Eat plenty of fresh fruits and vegetables.  Try to fill half of your plate at each meal with fruits and vegetables. ? Eat whole grains, such as whole wheat pasta, brown rice, or whole grain bread. Fill about one quarter of your plate with whole grains. ? Eat or drink low-fat dairy products, such as skim milk or low-fat yogurt. ? Avoid fatty cuts of meat, processed or cured meats, and poultry with skin. Fill about one quarter of your plate with lean proteins, such as fish, chicken without skin, beans, eggs, and tofu. ? Avoid premade and processed foods. These tend to be higher in sodium, added sugar, and fat.  Reduce your daily sodium intake. Most people with hypertension should eat less than 1,500 mg of sodium a day.  Limit alcohol intake to no more than 1 drink a day for nonpregnant women and 2 drinks a day for men. One drink equals 12 oz of beer, 5 oz of wine, or 1 oz of hard liquor. Lifestyle  Work with your health care provider to maintain a healthy body weight or to lose weight. Ask what an ideal weight is for you.  Get at least 30 minutes of exercise that causes your heart to beat faster (aerobic exercise) most days of the week. Activities may include walking, swimming, or biking.  Include exercise to strengthen your muscles (resistance exercise), such as pilates or lifting weights, as part of your weekly exercise routine. Try to do these types of exercises for 30 minutes at least 3 days a week.  Do not use any products that contain nicotine or tobacco, such as cigarettes and e-cigarettes. If you need help quitting, ask your health care provider.  Monitor your blood pressure at home as told by your health care provider.  Keep all follow-up visits as told by your health care provider. This is important. Medicines  Take over-the-counter and prescription medicines only as told by your health care provider. Follow directions carefully. Blood pressure medicines must be taken as prescribed.  Do not skip doses of blood pressure  medicine. Doing this puts you at risk for problems and can make the medicine less effective.  Ask your health care provider about side effects or reactions to medicines that you should watch for. Contact a health care provider if:  You think you are having a reaction to a medicine you are taking.  You have headaches that keep coming back (recurring).  You feel dizzy.  You have swelling in your ankles.  You have trouble with your vision. Get help right away if:  You develop a severe headache or confusion.  You have unusual weakness or numbness.  You feel faint.  You have severe pain in your chest or abdomen.  You vomit repeatedly.  You have trouble breathing. Summary  Hypertension is when the force of blood pumping through your arteries is too strong. If this condition is not controlled, it may put you at risk for serious complications.  Your personal target blood pressure may vary depending on your medical conditions, your age, and other factors. For most people, a normal blood pressure is less than 120/80.  Hypertension is treated with lifestyle changes, medicines, or a combination of both. Lifestyle changes include weight loss, eating a healthy, low-sodium diet, exercising more, and limiting alcohol. This information is not intended to replace advice given to you by your health care provider. Make sure you discuss any questions you have with your health care provider. Document Released: 05/14/2005 Document Revised: 04/11/2016 Document Reviewed: 04/11/2016 Elsevier Interactive Patient Education  [employer].

## 2018-01-29 NOTE — Progress Notes (Signed)
Chief Complaint  Patient presents with  . Follow-up   F/u  1. HTN uncontrolled today no meds taken on norvasc 2.5 mg qd BP log 117-160s/80s-102 increased with anxiety and pain pt is c/w DDI with strontium and CCB she had been on BB in the past 1/2 pill 1x per day with former PCP in [place]  2. Reviewed labs 12/27/16 TC 316, HDL 43, LDL 212, TG 358, HCV neg 12/27/16 A1C 5.5 h/o A1C 5.7 in 2017  3. H/o CAS neg right, mild in left   Review of Systems  Constitutional: Negative for weight loss.  HENT: Negative for hearing loss.   Eyes: Negative for blurred vision.  Respiratory: Negative for shortness of breath.   Cardiovascular: Negative for chest pain.  Genitourinary: Negative for dysuria.  Musculoskeletal: Positive for back pain.  Skin: Negative for rash.  Neurological: Negative for headaches.  Psychiatric/Behavioral: Negative for depression.   Past Medical History:  Diagnosis Date  . Carotid artery stenosis    mild L>R 03/26/16 [place]   . Diverticulosis    colonoscopy 10/13/16 [place]  . Factor 5 Leiden mutation, heterozygous ([place])   . History of blood transfusion    birth Rh factor   . Hyperlipidemia   . Hypertension   . Neuropathy    post surgical right leg s/p back surgery ruptured L4/5 disc repair with microdiscetomy   . OSA on CPAP   . Right leg DVT ([place])    2010 s/p back surgery   . Thyroid disease    hypothyroidism    Past Surgical History:  Procedure Laterality Date  . APPENDECTOMY     2000  . BREAST SURGERY     biospy ? year   . ruptured disc repair     L4/5 10/3844 with complications residual right leg weakness/reduced sensation and DVT in 2010 post surgery    Family History  Problem Relation Age of Onset  . Hypertension Mother   . Stroke Mother        in 66s   . Heart disease Father   . Stroke Father   . Cancer Brother        esophageal cancer   . Stroke Paternal Grandfather   . Hyperlipidemia Other    Social History   Socioeconomic History  .  Marital status: Married    Spouse name: Not on file  . Number of children: Not on file  . Years of education: Not on file  . Highest education level: Not on file  Occupational History  . Not on file  Social Needs  . Financial resource strain: Not on file  . Food insecurity:    Worry: Not on file    Inability: Not on file  . Transportation needs:    Medical: Not on file    Non-medical: Not on file  Tobacco Use  . Smoking status: Never Smoker  . Smokeless tobacco: Never Used  Substance and Sexual Activity  . Alcohol use: Yes  . Drug use: Never  . Sexual activity: Not on file  Lifestyle  . Physical activity:    Days per week: Not on file    Minutes per session: Not on file  . Stress: Not on file  Relationships  . Social connections:    Talks on phone: Not on file    Gets together: Not on file    Attends religious service: Not on file    Active member of club or organization: Not on file    Attends meetings  of clubs or organizations: Not on file    Relationship status: Not on file  . Intimate partner violence:    Fear of current or ex partner: Not on file    Emotionally abused: Not on file    Physically abused: Not on file    Forced sexual activity: Not on file  Other Topics Concern  . Not on file  Social History Narrative   BSN CCM, RN   Moved from [place] recently    2 daughters    Retired and married    No guns, wears seat belt, safe in relationship    Current Meds  Medication Sig  . ALPHA LIPOIC ACID PO Take 250 mg by mouth.  Marland Kitchen amLODipine (NORVASC) 2.5 MG tablet Take 1 tablet (2.5 mg total) by mouth daily. In am  . Ascorbic Acid (VITAMIN C) 1000 MG tablet Take 1,000 mg by mouth 3 (three) times daily.  Marland Kitchen aspirin EC 81 MG tablet Take 81 mg by mouth daily.  . Cholecalciferol (VITAMIN D3) 5000 units CAPS Take by mouth.  . diazepam (VALIUM) 2 MG tablet 1/2 pill to 1 pill qhs prn  . gabapentin (NEURONTIN) 100 MG capsule Take 2 capsules (200 mg total) by mouth 3  (three) times daily.  Marland Kitchen HYDROcodone-acetaminophen (NORCO/VICODIN) 5-325 MG tablet Take 1 tablet by mouth as needed (only for flare ups).   Marland Kitchen levothyroxine (SYNTHROID, LEVOTHROID) 50 MCG tablet Take 1 tablet (50 mcg total) by mouth daily before breakfast.  . MAGNESIUM MALATE PO Take 625 mg by mouth.  . phenazopyridine (PYRIDIUM) 95 MG tablet Take 1 tablet (95 mg total) by mouth 3 (three) times daily as needed for pain.  . Strontium Chloride POWD Use as directed 500 mg in the mouth or throat.    Allergies  Allergen Reactions  . Macrobid [Nitrofurantoin Macrocrystal]     Nausea and sob   . Sulfa Antibiotics     Swelling     Recent Results (from the past 2160 hour(s))  Comprehensive metabolic panel     Status: Abnormal   Collection Time: 12/10/17 11:33 AM  Result Value Ref Range   Sodium 127 (L) 135 - 145 mmol/L   Potassium 3.6 3.5 - 5.1 mmol/L   Chloride 96 (L) 98 - 111 mmol/L    Comment: Please note change in reference range.   CO2 21 (L) 22 - 32 mmol/L   Glucose, Bld 115 (H) 70 - 99 mg/dL    Comment: Please note change in reference range.   BUN 16 8 - 23 mg/dL    Comment: Please note change in reference range.   Creatinine, Ser 0.66 0.44 - 1.00 mg/dL   Calcium 9.2 8.9 - 10.3 mg/dL   Total Protein 7.3 6.5 - 8.1 g/dL   Albumin 4.2 3.5 - 5.0 g/dL   AST 27 15 - 41 U/L   ALT 24 0 - 44 U/L    Comment: Please note change in reference range.   Alkaline Phosphatase 75 38 - 126 U/L   Total Bilirubin 0.8 0.3 - 1.2 mg/dL   GFR calc non Af Amer >60 >60 mL/min   GFR calc Af Amer >60 >60 mL/min    Comment: (NOTE) The eGFR has been calculated using the CKD EPI equation. This calculation has not been validated in all clinical situations. eGFR's persistently <60 mL/min signify possible Chronic Kidney Disease.    Anion gap 10 5 - 15    Comment: Performed at [facility], [place],  [place], [place] [address]  CBC with Differential     Status: Abnormal   Collection Time:  12/10/17 11:33 AM  Result Value Ref Range   WBC 12.3 (H) 3.6 - 11.0 K/uL   RBC 4.08 3.80 - 5.20 MIL/uL   Hemoglobin 13.3 12.0 - 16.0 g/dL   HCT 37.0 35.0 - 47.0 %   MCV 90.7 80.0 - 100.0 fL   MCH 32.5 26.0 - 34.0 pg   MCHC 35.8 32.0 - 36.0 g/dL   RDW 12.3 11.5 - 14.5 %   Platelets 291 150 - 440 K/uL   Neutrophils Relative % 72 %   Neutro Abs 8.8 (H) 1.4 - 6.5 K/uL   Lymphocytes Relative 20 %   Lymphs Abs 2.5 1.0 - 3.6 K/uL   Monocytes Relative 6 %   Monocytes Absolute 0.8 0.2 - 0.9 K/uL   Eosinophils Relative 1 %   Eosinophils Absolute 0.1 0 - 0.7 K/uL   Basophils Relative 1 %   Basophils Absolute 0.1 0 - 0.1 K/uL    Comment: Performed at [facility], [address] [place], [place] [address]  Urinalysis, Complete w Microscopic     Status: Abnormal   Collection Time: 12/10/17 12:23 PM  Result Value Ref Range   Color, Urine STRAW (A) YELLOW   APPearance CLEAR (A) CLEAR   Specific Gravity, Urine 1.005 1.005 - 1.030   pH 5.0 5.0 - 8.0   Glucose, UA NEGATIVE NEGATIVE mg/dL   Hgb urine dipstick NEGATIVE NEGATIVE   Bilirubin Urine NEGATIVE NEGATIVE   Ketones, ur 5 (A) NEGATIVE mg/dL   Protein, ur NEGATIVE NEGATIVE mg/dL   Nitrite NEGATIVE NEGATIVE   Leukocytes, UA NEGATIVE NEGATIVE   WBC, UA NONE SEEN 0 - 5 WBC/hpf   Bacteria, UA NONE SEEN NONE SEEN   Squamous Epithelial / LPF NONE SEEN 0 - 5    Comment: Performed at [facility], [address], [place], [place] [address]  Urine Culture     Status: Abnormal   Collection Time: 12/10/17  1:02 PM  Result Value Ref Range   Specimen Description      URINE, RANDOM Performed at [facility], [address] [place], [place] [address]    Special Requests      NONE Performed at [facility], [address] [place], [place] [address]    Culture MULTIPLE SPECIES PRESENT, SUGGEST RECOLLECTION (A)    Report Status 12/11/2017 FINAL   Basic Metabolic Panel (BMET)     Status: Abnormal    Collection Time: 12/18/17  9:50 AM  Result Value Ref Range   Sodium 137 135 - 145 mEq/L   Potassium 4.0 3.5 - 5.1 mEq/L   Chloride 104 96 - 112 mEq/L   CO2 27 19 - 32 mEq/L   Glucose, Bld 102 (H) 70 - 99 mg/dL   BUN 12 6 - 23 mg/dL   Creatinine, Ser 0.72 0.40 - 1.20 mg/dL   Calcium 9.5 8.4 - 10.5 mg/dL   GFR 85.64 >60.00 mL/min  Urine Culture     Status: None   Collection Time: 12/27/17  8:31 AM  Result Value Ref Range   MICRO NUMBER: [id number]    SPECIMEN QUALITY: ADEQUATE    Sample Source NOT GIVEN    STATUS: FINAL    Result: No Growth   Urinalysis, Routine w reflex microscopic     Status: None   Collection Time: 12/27/17 11:16 AM  Result Value Ref Range   Specific Gravity, UA  1.010 1.005 - 1.030   pH, UA 6.5 5.0 - 7.5   Color, UA Yellow Yellow   Appearance Ur Clear Clear   Leukocytes, UA Negative Negative   Protein, UA Negative Negative/Trace   Glucose, UA Negative Negative   Ketones, UA Negative Negative   RBC, UA Negative Negative   Bilirubin, UA Negative Negative   Urobilinogen, Ur 0.2 0.2 - 1.0 mg/dL   Nitrite, UA Negative Negative   Microscopic Examination Comment     Comment: Microscopic not indicated and not performed.   Objective  Body mass index is 28.32 kg/m. Wt Readings from Last 3 Encounters:  01/29/18 170 lb 3.2 oz (77.2 kg)  12/18/17 166 lb (75.3 kg)  12/10/17 162 lb (73.5 kg)   Temp Readings from Last 3 Encounters:  01/29/18 98.5 F (36.9 C) (Oral)  12/18/17 98.1 F (36.7 C) (Oral)  12/10/17 98 F (36.7 C) (Oral)   BP Readings from Last 3 Encounters:  01/29/18 (!) 156/78  12/18/17 (!) 164/84  12/10/17 123/62   Pulse Readings from Last 3 Encounters:  01/29/18 75  12/18/17 75  12/10/17 63    Physical Exam  Constitutional: She is oriented to person, place, and time. Vital signs are normal. She appears well-developed and well-nourished. She is cooperative.  HENT:  Head: Normocephalic and atraumatic.  Mouth/Throat: Oropharynx is clear  and moist and mucous membranes are normal.  Eyes: Pupils are equal, round, and reactive to light. Conjunctivae are normal.  Cardiovascular: Normal rate, regular rhythm and normal heart sounds.  Pulmonary/Chest: Effort normal and breath sounds normal.  Neurological: She is alert and oriented to person, place, and time. Gait normal.  Skin: Skin is warm, dry and intact.  Psychiatric: She has a normal mood and affect. Her speech is normal and behavior is normal. Judgment and thought content normal. Cognition and memory are normal.  Nursing note and vitals reviewed.   Assessment   1. HTN/HLD h/o mild CAS  2. HM Plan  1. Due to pt c/w DDI with CCB and strontium she wishes to d/c CCB and disc 1/2 life will try trial of BB coreg 6.25 mg bid  Call back in 2 weeks with BP readings  Disc ARB, HCTZ will hold at this time though rec 1st line she has never been on these has been on BB though  Check fasting labs 05/2018  2.  Declines flu vaccine, prevnar, pna 23 vaccine Had zostervax 2012 not shingrix Td had 06/29/15 declines Tdap for now   Out of age window pap h/o abnormal pap in 20s but normal since then  Colonoscopy had 10/13/16 diverticulosis reviewed letter will need report in future  -Colonoscopy 10/13/16 diverticulosis [facility] Dr. Cathlean Cower fax # [phone]  Mammogram had 06/20/17 normal reviewed letter will need report in future -Mammogram 06/20/17 normal [facility] fax # [phone]  DEXA 05/2017 (h/o osteopenia hip and osteoporosis spine) get records prior PCP  -per pt this is why taking Strontium 5000 mg qd and had helped with osteoporosis improved to osteopenia over 3 years  -due in 2 years pt wants to have done 2021  Refer to Dr. Santiago Glad tbse h/o BCC forehead and SCC nose needs tbse appt sch 02/03/18  Will need eye exam in the future Never smoker  Provider: Dr. Olivia Mackie McLean-Scocuz[facility]

## 2018-02-15 ENCOUNTER — Encounter: Payer: Self-pay | Admitting: Internal Medicine

## 2018-03-06 ENCOUNTER — Encounter: Payer: Self-pay | Admitting: Internal Medicine

## 2018-03-06 ENCOUNTER — Other Ambulatory Visit: Payer: Self-pay | Admitting: Internal Medicine

## 2018-03-06 DIAGNOSIS — I10 Essential (primary) hypertension: Secondary | ICD-10-CM

## 2018-03-06 MED ORDER — AMLODIPINE BESYLATE 2.5 MG PO TABS: 2.5000 mg | ORAL_TABLET | Freq: Every day | ORAL | 3 refills | Status: DC

## 2018-04-22 ENCOUNTER — Other Ambulatory Visit: Payer: Self-pay | Admitting: Internal Medicine

## 2018-04-22 DIAGNOSIS — M5416 Radiculopathy, lumbar region: Secondary | ICD-10-CM

## 2018-04-22 MED ORDER — GABAPENTIN 100 MG PO CAPS: 200.0000 mg | ORAL_CAPSULE | Freq: Three times a day (TID) | ORAL | 3 refills | Status: DC

## 2018-06-11 ENCOUNTER — Other Ambulatory Visit: Payer: Self-pay | Admitting: Internal Medicine

## 2018-06-11 MED ORDER — LEVOTHYROXINE SODIUM 50 MCG PO TABS: 50.0000 ug | ORAL_TABLET | Freq: Every day | ORAL | 3 refills | Status: DC

## 2018-06-17 ENCOUNTER — Ambulatory Visit: Payer: Medicare Other

## 2018-06-17 ENCOUNTER — Ambulatory Visit (INDEPENDENT_AMBULATORY_CARE_PROVIDER_SITE_OTHER): Payer: Medicare Other | Admitting: Internal Medicine

## 2018-06-17 ENCOUNTER — Encounter: Payer: Self-pay | Admitting: Internal Medicine

## 2018-06-17 VITALS — BP 132/82 | HR 73 | Temp 97.9°F | Ht 65.0 in | Wt 176.0 lb

## 2018-06-17 VITALS — BP 160/88 | HR 73 | Temp 97.9°F | Resp 15 | Ht 65.5 in | Wt 176.8 lb

## 2018-06-17 DIAGNOSIS — G4733 Obstructive sleep apnea (adult) (pediatric): Secondary | ICD-10-CM | POA: Diagnosis not present

## 2018-06-17 DIAGNOSIS — G8929 Other chronic pain: Secondary | ICD-10-CM

## 2018-06-17 DIAGNOSIS — R109 Unspecified abdominal pain: Secondary | ICD-10-CM

## 2018-06-17 DIAGNOSIS — I10 Essential (primary) hypertension: Secondary | ICD-10-CM | POA: Diagnosis not present

## 2018-06-17 DIAGNOSIS — E039 Hypothyroidism, unspecified: Secondary | ICD-10-CM

## 2018-06-17 DIAGNOSIS — Z0000 Encounter for general adult medical examination without abnormal findings: Secondary | ICD-10-CM | POA: Diagnosis not present

## 2018-06-17 DIAGNOSIS — M5440 Lumbago with sciatica, unspecified side: Secondary | ICD-10-CM | POA: Diagnosis not present

## 2018-06-17 DIAGNOSIS — Z9989 Dependence on other enabling machines and devices: Secondary | ICD-10-CM

## 2018-06-17 DIAGNOSIS — E559 Vitamin D deficiency, unspecified: Secondary | ICD-10-CM

## 2018-06-17 DIAGNOSIS — E785 Hyperlipidemia, unspecified: Secondary | ICD-10-CM | POA: Diagnosis not present

## 2018-06-17 DIAGNOSIS — Z13818 Encounter for screening for other digestive system disorders: Secondary | ICD-10-CM

## 2018-06-17 LAB — CBC WITH DIFFERENTIAL/PLATELET
Basophils Absolute: 0.1 10*3/uL (ref 0.0–0.1)
Basophils Relative: 1 % (ref 0.0–3.0)
Eosinophils Absolute: 0.1 10*3/uL (ref 0.0–0.7)
Eosinophils Relative: 2.1 % (ref 0.0–5.0)
HEMATOCRIT: 40.7 % (ref 36.0–46.0)
Hemoglobin: 13.7 g/dL (ref 12.0–15.0)
LYMPHS PCT: 46.1 % — AB (ref 12.0–46.0)
Lymphs Abs: 3 10*3/uL (ref 0.7–4.0)
MCHC: 33.5 g/dL (ref 30.0–36.0)
MCV: 91.9 fl (ref 78.0–100.0)
Monocytes Absolute: 0.5 10*3/uL (ref 0.1–1.0)
Monocytes Relative: 7.5 % (ref 3.0–12.0)
NEUTROS ABS: 2.8 10*3/uL (ref 1.4–7.7)
Neutrophils Relative %: 43.3 % (ref 43.0–77.0)
PLATELETS: 302 10*3/uL (ref 150.0–400.0)
RBC: 4.43 Mil/uL (ref 3.87–5.11)
RDW: 12.9 % (ref 11.5–15.5)
WBC: 6.4 10*3/uL (ref 4.0–10.5)

## 2018-06-17 LAB — COMPREHENSIVE METABOLIC PANEL
ALK PHOS: 83 U/L (ref 39–117)
ALT: 34 U/L (ref 0–35)
AST: 24 U/L (ref 0–37)
Albumin: 4.3 g/dL (ref 3.5–5.2)
BILIRUBIN TOTAL: 0.5 mg/dL (ref 0.2–1.2)
BUN: 15 mg/dL (ref 6–23)
CALCIUM: 9.6 mg/dL (ref 8.4–10.5)
CO2: 26 meq/L (ref 19–32)
CREATININE: 0.73 mg/dL (ref 0.40–1.20)
Chloride: 102 mEq/L (ref 96–112)
GFR: 79.19 mL/min (ref >60.00)
GLUCOSE: 100 mg/dL — AB (ref 70–99)
Potassium: 4 mEq/L (ref 3.5–5.1)
Sodium: 136 mEq/L (ref 135–145)
TOTAL PROTEIN: 7.5 g/dL (ref 6.0–8.3)

## 2018-06-17 LAB — LIPID PANEL
CHOL/HDL RATIO: 7
Cholesterol: 341 mg/dL — ABNORMAL HIGH (ref 0–200)
HDL: 50.7 mg/dL (ref >39.00)
LDL Cholesterol: 258 mg/dL — ABNORMAL HIGH (ref 0–99)
NONHDL: 290.65
Triglycerides: 165 mg/dL — ABNORMAL HIGH (ref 0.0–149.0)
VLDL: 33 mg/dL (ref 0.0–40.0)

## 2018-06-17 LAB — TSH: TSH: 1.88 u[IU]/mL (ref 0.35–4.50)

## 2018-06-17 LAB — VITAMIN D 25 HYDROXY (VIT D DEFICIENCY, FRACTURES): VITD: 60.94 ng/mL (ref 30.00–100.00)

## 2018-06-17 NOTE — Progress Notes (Signed)
Agree with note   TMS 

## 2018-06-17 NOTE — Patient Instructions (Addendum)
  Tonya Green , Thank you for taking time to come for your Medicare Wellness Visit. I appreciate your ongoing commitment to your health goals. Please review the following plan we discussed and let me know if I can assist you in the future.   These are the goals we discussed: Goals      Patient Stated   . DIET - REDUCE SUGAR INTAKE (pt-stated)       This is a list of the screening recommended for you and due dates:  Health Maintenance  Topic Date Due  .  Hepatitis C: One time screening is recommended by Center for Disease Control  (CDC) for  adults born from [birth year] through 1966.   03/10/1951  . Tetanus Vaccine  01/27/1969  . DEXA scan (bone density measurement)  01/28/2015  . Flu Shot  12/26/2017  . Pneumonia vaccines (1 of 2 - PCV13) 11/05/2018*  . Mammogram  06/21/2019  . Colon Cancer Screening  10/14/2026  *Topic was postponed. The date shown is not the original due date.

## 2018-06-17 NOTE — Progress Notes (Signed)
Chief Complaint  Patient presents with  . Follow-up   F/u  1. C/o abdominal pressure h/o UTI no dysuria or freq may feel like getting one  2. osa on cpap needs repeat sleep study  3. Chronic back pain wants referral to PT no PT since 2010 4. HTN on norvasc 2.5 mg qd initial BP elevated repeat 132/82  5. Wants form signed today to dedicate body to [place] program   Review of Systems  Constitutional: Negative for weight loss.  HENT: Negative for hearing loss.   Eyes: Negative for blurred vision.  Respiratory: Negative for shortness of breath.   Cardiovascular: Negative for chest pain.  Gastrointestinal: Negative for abdominal pain.  Musculoskeletal: Negative for falls.  Skin: Negative for rash.  Neurological: Negative for headaches.  Psychiatric/Behavioral: The patient is nervous/anxious.        Anxiety with MD appts     Past Medical History:  Diagnosis Date  . Carotid artery stenosis    mild L>R 03/26/16 [place]   . Diverticulosis    colonoscopy 10/13/16 [place]  . Factor 5 Leiden mutation, heterozygous ([place])   . History of blood transfusion    birth Rh factor   . Hyperlipidemia   . Hypertension   . Neuropathy    post surgical right leg s/p back surgery ruptured L4/5 disc repair with microdiscetomy   . OSA on CPAP   . Right leg DVT ([place])    2010 s/p back surgery was on Coumadin x 6 months   . Thyroid disease    hypothyroidism    Past Surgical History:  Procedure Laterality Date  . APPENDECTOMY     2000  . BREAST SURGERY     biospy ? year   . ruptured disc repair     L4/5 09/4654 with complications residual right leg weakness/reduced sensation and DVT in 2010 post surgery    Family History  Problem Relation Age of Onset  . Hypertension Mother   . Stroke Mother        in 21s   . Heart disease Father   . Stroke Father   . Cancer Brother        esophageal cancer   . Stroke Paternal Grandfather   . Hyperlipidemia Other    Social History    Socioeconomic History  . Marital status: Married    Spouse name: Not on file  . Number of children: Not on file  . Years of education: Not on file  . Highest education level: Not on file  Occupational History  . Not on file  Social Needs  . Financial resource strain: Not hard at all  . Food insecurity:    Worry: Never true    Inability: Never true  . Transportation needs:    Medical: Not on file    Non-medical: Not on file  Tobacco Use  . Smoking status: Never Smoker  . Smokeless tobacco: Never Used  Substance and Sexual Activity  . Alcohol use: Yes  . Drug use: Never  . Sexual activity: Not on file  Lifestyle  . Physical activity:    Days per week: 7 days    Minutes per session: 60 min  . Stress: Not at all  Relationships  . Social connections:    Talks on phone: Not on file    Gets together: Not on file    Attends religious service: Not on file    Active member of club or organization: Not on file    Attends  meetings of clubs or organizations: Not on file    Relationship status: Married  . Intimate partner violence:    Fear of current or ex partner: No    Emotionally abused: No    Physically abused: No    Forced sexual activity: No  Other Topics Concern  . Not on file  Social History Narrative   BSN CCM, RN   Moved from [place] recently    2 daughters    Retired and married    No guns, wears seat belt, safe in relationship    No outpatient medications have been marked as taking for the 06/17/18 encounter (Office Visit) with McLean-Scocuzza, Nino Glow, MD.   Allergies  Allergen Reactions  . Macrobid [Nitrofurantoin Macrocrystal]     Nausea and sob   . Sulfa Antibiotics     Swelling     No results found for this or any previous visit (from the past 2160 hour(s)). Objective  Body mass index is 29.29 kg/m. Wt Readings from Last 3 Encounters:  06/17/18 176 lb (79.8 kg)  06/17/18 176 lb 12.8 oz (80.2 kg)  01/29/18 170 lb 3.2 oz (77.2 kg)   Temp  Readings from Last 3 Encounters:  06/17/18 97.9 F (36.6 C) (Oral)  06/17/18 97.9 F (36.6 C) (Oral)  01/29/18 98.5 F (36.9 C) (Oral)   BP Readings from Last 3 Encounters:  06/17/18 (!) 160/88  06/17/18 (!) 160/88  01/29/18 (!) 156/78   Pulse Readings from Last 3 Encounters:  06/17/18 73  06/17/18 73  01/29/18 75    Physical Exam Vitals signs and nursing note reviewed.  Constitutional:      Appearance: Normal appearance. She is well-developed and well-groomed.  HENT:     Head: Normocephalic and atraumatic.     Nose: Nose normal.     Mouth/Throat:     Mouth: Mucous membranes are moist.     Pharynx: Oropharynx is clear.  Eyes:     Conjunctiva/sclera: Conjunctivae normal.     Pupils: Pupils are equal, round, and reactive to light.  Cardiovascular:     Rate and Rhythm: Normal rate and regular rhythm.     Heart sounds: Normal heart sounds.  Pulmonary:     Effort: Pulmonary effort is normal.     Breath sounds: Normal breath sounds.  Skin:    General: Skin is warm and dry.  Neurological:     General: No focal deficit present.     Mental Status: She is alert and oriented to person, place, and time.     Gait: Gait normal.  Psychiatric:        Attention and Perception: Attention and perception normal.        Mood and Affect: Mood and affect normal.        Speech: Speech normal.        Behavior: Behavior normal. Behavior is cooperative.        Thought Content: Thought content normal.        Cognition and Memory: Cognition and memory normal.        Judgment: Judgment normal.     Assessment   1. HTN improved repeat, HLD 2. Abdominal pressure  3. Chronic back pain  4. OSA on cpap  5. HM Plan   1. Cont norvasc 2.5 mg qd  Check fasting labs today 2. UA and culture  3. Referred to PT  4. Referred to [facility] OSA repeat  5.  Declines flu vaccine, prevnar, pna 23 vaccine Had zostervax  2012 not shingrix given Rx shingrix today  Td had 06/29/15 declines Tdap for now    Out of age window pap h/o abnormal pap in 20s but normal since then  Colonoscopy had 10/13/16 diverticulosis reviewed letter will need report in future  -Colonoscopy 10/13/16 diverticulosis [facility] Dr. Cathlean Cower fax # [phone]  Mammogram had 06/20/17 normal reviewed letter will need report in future -Mammogram 06/20/17 normal [place] fax # [phone] -today wants to wait until 06/21/19 for next mammo  DEXA 05/2017 (h/o osteopenia hip and osteoporosis spine) get records prior PCP  -per pt this is why taking Strontium5000 mg qdand had helped with osteoporosisimproved to osteopenia over 3 years -due in 2 years pt wants to have done 2021  Refer to Dr. Santiago Glad tbse h/o BCC forehead and SCC nose needs tbseappt sch 02/03/18 no bxs normal exam  Will need eye exam in the future Never smoker  Life line screening 03/26/16 left mild CAS No Afib  Nl ABI  TC 298, HDL 52, LDL 212, TG 170 declines statin  BP 129/84   Provider: Dr. Olivia Mackie McLean-Scocuzza-Internal Medicine

## 2018-06-17 NOTE — Progress Notes (Signed)
Subjective:   Tonya Green is a 68 y.o. female who presents for an Initial Medicare Annual Wellness Visit.  Review of Systems    No ROS.  Medicare Wellness Visit. Additional risk factors are reflected in the social history.  Cardiac Risk Factors include: advanced age (>71men, >16 women);hypertension     Objective:    Today's Vitals   06/17/18 0835  BP: (!) 160/88  Pulse: 73  Resp: 15  Temp: 97.9 F (36.6 C)  TempSrc: Oral  SpO2: 98%  Weight: 176 lb 12.8 oz (80.2 kg)  Height: 5' 5.5" (1.664 m)   Body mass index is 28.97 kg/m.  Advanced Directives 06/17/2018  Does Patient Have a Medical Advance Directive? Yes  Type of Paramedic of [place];Living will  Does patient want to make changes to medical advance directive? No - Patient declined  Copy of [place] in Chart? Yes - validated most recent copy scanned in chart (See row information)    Current Medications (verified) Outpatient Encounter Medications as of 06/17/2018  Medication Sig  . ALPHA LIPOIC ACID PO Take 250 mg by mouth.  Marland Kitchen amLODipine (NORVASC) 2.5 MG tablet Take 1 tablet (2.5 mg total) by mouth daily. In am if BP>140/?90 take another tablet  . Ascorbic Acid (VITAMIN C) 1000 MG tablet Take 1,000 mg by mouth 3 (three) times daily.  Marland Kitchen aspirin EC 81 MG tablet Take 81 mg by mouth daily.  . Cholecalciferol (VITAMIN D3) 5000 units CAPS Take by mouth.  . diazepam (VALIUM) 2 MG tablet 1/2 pill to 1 pill qhs prn  . gabapentin (NEURONTIN) 100 MG capsule Take 2 capsules (200 mg total) by mouth 3 (three) times daily.  Marland Kitchen HYDROcodone-acetaminophen (NORCO/VICODIN) 5-325 MG tablet Take 1 tablet by mouth as needed (only for flare ups).   Marland Kitchen levothyroxine (SYNTHROID, LEVOTHROID) 50 MCG tablet Take 1 tablet (50 mcg total) by mouth daily before breakfast.  . MAGNESIUM MALATE PO Take 625 mg by mouth.  . NON FORMULARY [place] 500 mg qid  nattokinase 100 mg qd  . phenazopyridine (PYRIDIUM) 95  MG tablet Take 1 tablet (95 mg total) by mouth 3 (three) times daily as needed for pain.  . Strontium Chloride POWD Use as directed 500 mg in the mouth or throat.    No facility-administered encounter medications on file as of 06/17/2018.     Allergies (verified) Macrobid [nitrofurantoin macrocrystal] and Sulfa antibiotics   History: Past Medical History:  Diagnosis Date  . Carotid artery stenosis    mild L>R 03/26/16 [place]   . Diverticulosis    colonoscopy 10/13/16 [place]  . Factor 5 Leiden mutation, heterozygous ([place])   . History of blood transfusion    birth Rh factor   . Hyperlipidemia   . Hypertension   . Neuropathy    post surgical right leg s/p back surgery ruptured L4/5 disc repair with microdiscetomy   . OSA on CPAP   . Right leg DVT ([place])    2010 s/p back surgery was on Coumadin x 6 months   . Thyroid disease    hypothyroidism    Past Surgical History:  Procedure Laterality Date  . APPENDECTOMY     2000  . BREAST SURGERY     biospy ? year   . ruptured disc repair     L4/5 06/7251 with complications residual right leg weakness/reduced sensation and DVT in 2010 post surgery    Family History  Problem Relation Age of Onset  . Hypertension  Mother   . Stroke Mother        in 10s   . Heart disease Father   . Stroke Father   . Cancer Brother        esophageal cancer   . Stroke Paternal Grandfather   . Hyperlipidemia Other    Social History   Socioeconomic History  . Marital status: Married    Spouse name: Not on file  . Number of children: Not on file  . Years of education: Not on file  . Highest education level: Not on file  Occupational History  . Not on file  Social Needs  . Financial resource strain: Not hard at all  . Food insecurity:    Worry: Never true    Inability: Never true  . Transportation needs:    Medical: Not on file    Non-medical: Not on file  Tobacco Use  . Smoking status: Never Smoker  . Smokeless tobacco: Never Used    Substance and Sexual Activity  . Alcohol use: Yes  . Drug use: Never  . Sexual activity: Not on file  Lifestyle  . Physical activity:    Days per week: 7 days    Minutes per session: 60 min  . Stress: Not at all  Relationships  . Social connections:    Talks on phone: Not on file    Gets together: Not on file    Attends religious service: Not on file    Active member of club or organization: Not on file    Attends meetings of clubs or organizations: Not on file    Relationship status: Married  Other Topics Concern  . Not on file  Social History Narrative   BSN CCM, RN   Moved from [place] recently    2 daughters    Retired and married    No guns, wears seat belt, safe in relationship     Tobacco Counseling Counseling given: Not Answered   Clinical Intake:  Pre-visit preparation completed: Yes        Diabetes: No  How often do you need to have someone help you when you read instructions, pamphlets, or other written materials from your doctor or pharmacy?: 1 - Never  Interpreter Needed?: No      Activities of Daily Living In your present state of health, do you have any difficulty performing the following activities: 06/17/2018  Hearing? N  Vision? N  Difficulty concentrating or making decisions? N  Walking or climbing stairs? N  Dressing or bathing? N  Doing errands, shopping? N  Preparing Food and eating ? N  Using the Toilet? N  In the past six months, have you accidently leaked urine? N  Do you have problems with loss of bowel control? N  Managing your Medications? N  Managing your Finances? N  Housekeeping or managing your Housekeeping? N  Some recent data might be hidden     Immunizations and Health Maintenance Immunization History  Administered Date(s) Administered  . Influenza, High Dose Seasonal PF 03/18/2018   Health Maintenance Due  Topic Date Due  . Hepatitis C Screening  04-02-1951  . TETANUS/TDAP  01/27/1969  . DEXA SCAN   01/28/2015  . INFLUENZA VACCINE  12/26/2017    Patient Care Team: McLean-Scocuzza, Nino Glow, MD as PCP - General (Internal Medicine)  Indicate any recent Medical Services you may have received from other than [facility] providers in the past year (date may be approximate).     Assessment:  This is a routine wellness examination for [place].  Health Screenings  Mammogram -06/20/17 Colonoscopy -10/13/16 Glaucoma -none reported Hearing -passes the whisper test Dental- every 6 months Vision-macular degeneration, dry eye, cataract extracted bilateral. Next scheduled exam 07/2018  Social  Alcohol intake yes, rare Smoking history- none Smokers in home? none Illicit drug use? none Exercise -walking, water aerobic Diet -regular  Safety  Patient feels safe at home.  Patient does have smoke detectors at home  Patient does wear sunscreen or protective clothing when in direct sunlight  Patient does wear seat belt when driving or riding with others.   Activities of Daily Living Patient can do their own household chores. Denies needing assistance with: driving, feeding themselves, getting from bed to chair, getting to the toilet, bathing/showering, dressing, managing money, climbing flight of stairs, or preparing meals.   Depression Screen Patient denies losing interest in daily life, feeling hopeless, or crying easily over simple problems.   Fall Screen Patient denies being afraid of falling or falling in the last year.   Memory Screen Patient denies problems with memory, misplacing items, and is able to balance checkbook/bank accounts.  Patient is alert, normal appearance, oriented to person/place/and time. Correctly identified the president of the [place], recall of 3/3 objects, and performing simple calculations.  Patient displays appropriate judgement and can read correct time from watch face.   Immunizations The following Immunizations are up to date: Influenza. Discussed shingles,  pneumonia, and tetanus.   Other Providers Patient Care Team: McLean-Scocuzza, Nino Glow, MD as PCP - General (Internal Medicine)  Hearing/Vision screen  Visual Acuity Screening   Right eye Left eye Both eyes  Without correction:   20/50  With correction:   20/25  Hearing Screening Comments: Patient is able to hear conversational tones without difficulty.  No issues reported.   Dietary issues and exercise activities discussed: Current Exercise Habits: Home exercise routine, Type of exercise: walking(water aerobics, swimming), Time (Minutes): 60, Frequency (Times/Week): 5, Weekly Exercise (Minutes/Week): 300, Intensity: Mild  Goals      Patient Stated   . DIET - REDUCE SUGAR INTAKE (pt-stated)      Depression Screen PHQ 2/9 Scores 06/17/2018 12/18/2017  PHQ - 2 Score 0 0    Fall Risk Fall Risk  06/17/2018 12/18/2017  Falls in the past year? 0 No   Cognitive Function:     6CIT Screen 06/17/2018  What Year? 0 points  What month? 0 points  What time? 0 points  Count back from 20 0 points  Months in reverse 0 points  Repeat phrase 0 points  Total Score 0    Screening Tests Health Maintenance  Topic Date Due  . Hepatitis C Screening  [birth year]  . TETANUS/TDAP  01/27/1969  . DEXA SCAN  01/28/2015  . INFLUENZA VACCINE  12/26/2017  . PNA vac Low Risk Adult (1 of 2 - PCV13) 11/05/2018 (Originally 01/28/2015)  . MAMMOGRAM  06/21/2019  . COLONOSCOPY  10/14/2026     Plan:    End of life planning; Advance aging; Advanced directives discussed. Copy of current HCPOA/Living Will on file.    I have personally reviewed and noted the following in the patient's chart:   . Medical and social history . Use of alcohol, tobacco or illicit drugs  . Current medications and supplements . Functional ability and status . Nutritional status . Physical activity . Advanced directives . List of other physicians . Hospitalizations, surgeries, and ER visits in previous 12  months . Vitals .  Screenings to include cognitive, depression, and falls . Referrals and appointments  In addition, I have reviewed and discussed with patient certain preventive protocols, quality metrics, and best practice recommendations. A written personalized care plan for preventive services as well as general preventive health recommendations were provided to patient.     Varney Biles, LPN   [id number]/41/[id number]

## 2018-06-17 NOTE — Patient Instructions (Signed)
DASH Eating Plan  DASH stands for "Dietary Approaches to Stop Hypertension." The DASH eating plan is a healthy eating plan that has been shown to reduce high blood pressure (hypertension). It may also reduce your risk for type 2 diabetes, heart disease, and stroke. The DASH eating plan may also help with weight loss.  What are tips for following this plan?    General guidelines  · Avoid eating more than 2,300 mg (milligrams) of salt (sodium) a day. If you have hypertension, you may need to reduce your sodium intake to 1,500 mg a day.  · Limit alcohol intake to no more than 1 drink a day for nonpregnant women and 2 drinks a day for men. One drink equals 12 oz of beer, 5 oz of wine, or 1½ oz of hard liquor.  · Work with your health care provider to maintain a healthy body weight or to lose weight. Ask what an ideal weight is for you.  · Get at least 30 minutes of exercise that causes your heart to beat faster (aerobic exercise) most days of the week. Activities may include walking, swimming, or biking.  · Work with your health care provider or diet and nutrition specialist (dietitian) to adjust your eating plan to your individual calorie needs.  Reading food labels    · Check food labels for the amount of sodium per serving. Choose foods with less than 5 percent of the Daily Value of sodium. Generally, foods with less than 300 mg of sodium per serving fit into this eating plan.  · To find whole grains, look for the word "whole" as the first word in the ingredient list.  Shopping  · Buy products labeled as "low-sodium" or "no salt added."  · Buy fresh foods. Avoid canned foods and premade or frozen meals.  Cooking  · Avoid adding salt when cooking. Use salt-free seasonings or herbs instead of table salt or sea salt. Check with your health care provider or pharmacist before using salt substitutes.  · Do not fry foods. Cook foods using healthy methods such as baking, boiling, grilling, and broiling instead.  · Cook with  heart-healthy oils, such as olive, canola, soybean, or sunflower oil.  Meal planning  · Eat a balanced diet that includes:  ? 5 or more servings of fruits and vegetables each day. At each meal, try to fill half of your plate with fruits and vegetables.  ? Up to 6-8 servings of whole grains each day.  ? Less than 6 oz of lean meat, poultry, or fish each day. A 3-oz serving of meat is about the same size as a deck of cards. One egg equals 1 oz.  ? 2 servings of low-fat dairy each day.  ? A serving of nuts, seeds, or beans 5 times each week.  ? Heart-healthy fats. Healthy fats called Omega-3 fatty acids are found in foods such as flaxseeds and coldwater fish, like sardines, salmon, and mackerel.  · Limit how much you eat of the following:  ? Canned or prepackaged foods.  ? Food that is high in trans fat, such as fried foods.  ? Food that is high in saturated fat, such as fatty meat.  ? Sweets, desserts, sugary drinks, and other foods with added sugar.  ? Full-fat dairy products.  · Do not salt foods before eating.  · Try to eat at least 2 vegetarian meals each week.  · Eat more home-cooked food and less restaurant, buffet, and fast food.  ·   When eating at a restaurant, ask that your food be prepared with less salt or no salt, if possible.  What foods are recommended?  The items listed may not be a complete list. Talk with your dietitian about what dietary choices are best for you.  Grains  Whole-grain or whole-wheat bread. Whole-grain or whole-wheat pasta. Brown rice. Oatmeal. Quinoa. Bulgur. Whole-grain and low-sodium cereals. Pita bread. Low-fat, low-sodium crackers. Whole-wheat flour tortillas.  Vegetables  Fresh or frozen vegetables (raw, steamed, roasted, or grilled). Low-sodium or reduced-sodium tomato and vegetable juice. Low-sodium or reduced-sodium tomato sauce and tomato paste. Low-sodium or reduced-sodium canned vegetables.  Fruits  All fresh, dried, or frozen fruit. Canned fruit in natural juice (without  added sugar).  Meat and other protein foods  Skinless chicken or turkey. Ground chicken or turkey. Pork with fat trimmed off. Fish and seafood. Egg whites. Dried beans, peas, or lentils. Unsalted nuts, nut butters, and seeds. Unsalted canned beans. Lean cuts of beef with fat trimmed off. Low-sodium, lean deli meat.  Dairy  Low-fat (1%) or fat-free (skim) milk. Fat-free, low-fat, or reduced-fat cheeses. Nonfat, low-sodium ricotta or cottage cheese. Low-fat or nonfat yogurt. Low-fat, low-sodium cheese.  Fats and oils  Soft margarine without trans fats. Vegetable oil. Low-fat, reduced-fat, or light mayonnaise and salad dressings (reduced-sodium). Canola, safflower, olive, soybean, and sunflower oils. Avocado.  Seasoning and other foods  Herbs. Spices. Seasoning mixes without salt. Unsalted popcorn and pretzels. Fat-free sweets.  What foods are not recommended?  The items listed may not be a complete list. Talk with your dietitian about what dietary choices are best for you.  Grains  Baked goods made with fat, such as croissants, muffins, or some breads. Dry pasta or rice meal packs.  Vegetables  Creamed or fried vegetables. Vegetables in a cheese sauce. Regular canned vegetables (not low-sodium or reduced-sodium). Regular canned tomato sauce and paste (not low-sodium or reduced-sodium). Regular tomato and vegetable juice (not low-sodium or reduced-sodium). Pickles. Olives.  Fruits  Canned fruit in a light or heavy syrup. Fried fruit. Fruit in cream or butter sauce.  Meat and other protein foods  Fatty cuts of meat. Ribs. Fried meat. Bacon. Sausage. Bologna and other processed lunch meats. Salami. Fatback. Hotdogs. Bratwurst. Salted nuts and seeds. Canned beans with added salt. Canned or smoked fish. Whole eggs or egg yolks. Chicken or turkey with skin.  Dairy  Whole or 2% milk, cream, and half-and-half. Whole or full-fat cream cheese. Whole-fat or sweetened yogurt. Full-fat cheese. Nondairy creamers. Whipped toppings.  Processed cheese and cheese spreads.  Fats and oils  Butter. Stick margarine. Lard. Shortening. Ghee. Bacon fat. Tropical oils, such as coconut, palm kernel, or palm oil.  Seasoning and other foods  Salted popcorn and pretzels. Onion salt, garlic salt, seasoned salt, table salt, and sea salt. Worcestershire sauce. Tartar sauce. Barbecue sauce. Teriyaki sauce. Soy sauce, including reduced-sodium. Steak sauce. Canned and packaged gravies. Fish sauce. Oyster sauce. Cocktail sauce. Horseradish that you find on the shelf. Ketchup. Mustard. Meat flavorings and tenderizers. Bouillon cubes. Hot sauce and Tabasco sauce. Premade or packaged marinades. Premade or packaged taco seasonings. Relishes. Regular salad dressings.  Where to find more information:  · National Heart, Lung, and Blood Institute: www.nhlbi.nih.gov  · American Heart Association: www.heart.org  Summary  · The DASH eating plan is a healthy eating plan that has been shown to reduce high blood pressure (hypertension). It may also reduce your risk for type 2 diabetes, heart disease, and stroke.  · With the   DASH eating plan, you should limit salt (sodium) intake to 2,300 mg a day. If you have hypertension, you may need to reduce your sodium intake to 1,500 mg a day.  · When on the DASH eating plan, aim to eat more fresh fruits and vegetables, whole grains, lean proteins, low-fat dairy, and heart-healthy fats.  · Work with your health care provider or diet and nutrition specialist (dietitian) to adjust your eating plan to your individual calorie needs.  This information is not intended to replace advice given to you by your health care provider. Make sure you discuss any questions you have with your health care provider.  Document Released: 05/03/2011 Document Revised: 05/07/2016 Document Reviewed: 05/07/2016  Elsevier Interactive Patient Education © 2019 [employer]        Hypertension  Hypertension, commonly called high blood pressure, is when the force of  blood pumping through the arteries is too strong. The arteries are the blood vessels that carry blood from the heart throughout the body. Hypertension forces the heart to work harder to pump blood and may cause arteries to become narrow or stiff. Having untreated or uncontrolled hypertension can cause heart attacks, strokes, kidney disease, and other problems.  A blood pressure reading consists of a higher number over a lower number. Ideally, your blood pressure should be below 120/80. The first ("top") number is called the systolic pressure. It is a measure of the pressure in your arteries as your heart beats. The second ("bottom") number is called the diastolic pressure. It is a measure of the pressure in your arteries as the heart relaxes.  What are the causes?  The cause of this condition is not known.  What increases the risk?  Some risk factors for high blood pressure are under your control. Others are not.  Factors you can change  · Smoking.  · Having type 2 diabetes mellitus, high cholesterol, or both.  · Not getting enough exercise or physical activity.  · Being overweight.  · Having too much fat, sugar, calories, or salt (sodium) in your diet.  · Drinking too much alcohol.  Factors that are difficult or impossible to change  · Having chronic kidney disease.  · Having a family history of high blood pressure.  · Age. Risk increases with age.  · Race. You may be at higher risk if you are African-American.  · Gender. Men are at higher risk than women before age 45. After age 65, women are at higher risk than men.  · Having obstructive sleep apnea.  · Stress.  What are the signs or symptoms?  Extremely high blood pressure (hypertensive crisis) may cause:  · Headache.  · Anxiety.  · Shortness of breath.  · Nosebleed.  · Nausea and vomiting.  · Severe chest pain.  · Jerky movements you cannot control (seizures).  How is this diagnosed?  This condition is diagnosed by measuring your blood pressure while you are  seated, with your arm resting on a surface. The cuff of the blood pressure monitor will be placed directly against the skin of your upper arm at the level of your heart. It should be measured at least twice using the same arm. Certain conditions can cause a difference in blood pressure between your right and left arms.  Certain factors can cause blood pressure readings to be lower or higher than normal (elevated) for a short period of time:  · When your blood pressure is higher when you are in a health care   provider's office than when you are at home, this is called white coat hypertension. Most people with this condition do not need medicines.  · When your blood pressure is higher at home than when you are in a health care provider's office, this is called masked hypertension. Most people with this condition may need medicines to control blood pressure.  If you have a high blood pressure reading during one visit or you have normal blood pressure with other risk factors:  · You may be asked to return on a different day to have your blood pressure checked again.  · You may be asked to monitor your blood pressure at home for 1 week or longer.  If you are diagnosed with hypertension, you may have other blood or imaging tests to help your health care provider understand your overall risk for other conditions.  How is this treated?  This condition is treated by making healthy lifestyle changes, such as eating healthy foods, exercising more, and reducing your alcohol intake. Your health care provider may prescribe medicine if lifestyle changes are not enough to get your blood pressure under control, and if:  · Your systolic blood pressure is above 130.  · Your diastolic blood pressure is above 80.  Your personal target blood pressure may vary depending on your medical conditions, your age, and other factors.  Follow these instructions at home:  Eating and drinking    · Eat a diet that is high in fiber and potassium, and  low in sodium, added sugar, and fat. An example eating plan is called the DASH (Dietary Approaches to Stop Hypertension) diet. To eat this way:  ? Eat plenty of fresh fruits and vegetables. Try to fill half of your plate at each meal with fruits and vegetables.  ? Eat whole grains, such as whole wheat pasta, brown rice, or whole grain bread. Fill about one quarter of your plate with whole grains.  ? Eat or drink low-fat dairy products, such as skim milk or low-fat yogurt.  ? Avoid fatty cuts of meat, processed or cured meats, and poultry with skin. Fill about one quarter of your plate with lean proteins, such as fish, chicken without skin, beans, eggs, and tofu.  ? Avoid premade and processed foods. These tend to be higher in sodium, added sugar, and fat.  · Reduce your daily sodium intake. Most people with hypertension should eat less than 1,500 mg of sodium a day.  · Limit alcohol intake to no more than 1 drink a day for nonpregnant women and 2 drinks a day for men. One drink equals 12 oz of beer, 5 oz of wine, or 1½ oz of hard liquor.  Lifestyle    · Work with your health care provider to maintain a healthy body weight or to lose weight. Ask what an ideal weight is for you.  · Get at least 30 minutes of exercise that causes your heart to beat faster (aerobic exercise) most days of the week. Activities may include walking, swimming, or biking.  · Include exercise to strengthen your muscles (resistance exercise), such as pilates or lifting weights, as part of your weekly exercise routine. Try to do these types of exercises for 30 minutes at least 3 days a week.  · Do not use any products that contain nicotine or tobacco, such as cigarettes and e-cigarettes. If you need help quitting, ask your health care provider.  · Monitor your blood pressure at home as told by your health   care provider.  · Keep all follow-up visits as told by your health care provider. This is important.  Medicines  · Take over-the-counter and  prescription medicines only as told by your health care provider. Follow directions carefully. Blood pressure medicines must be taken as prescribed.  · Do not skip doses of blood pressure medicine. Doing this puts you at risk for problems and can make the medicine less effective.  · Ask your health care provider about side effects or reactions to medicines that you should watch for.  Contact a health care provider if:  · You think you are having a reaction to a medicine you are taking.  · You have headaches that keep coming back (recurring).  · You feel dizzy.  · You have swelling in your ankles.  · You have trouble with your vision.  Get help right away if:  · You develop a severe headache or confusion.  · You have unusual weakness or numbness.  · You feel faint.  · You have severe pain in your chest or abdomen.  · You vomit repeatedly.  · You have trouble breathing.  Summary  · Hypertension is when the force of blood pumping through your arteries is too strong. If this condition is not controlled, it may put you at risk for serious complications.  · Your personal target blood pressure may vary depending on your medical conditions, your age, and other factors. For most people, a normal blood pressure is less than 120/80.  · Hypertension is treated with lifestyle changes, medicines, or a combination of both. Lifestyle changes include weight loss, eating a healthy, low-sodium diet, exercising more, and limiting alcohol.  This information is not intended to replace advice given to you by your health care provider. Make sure you discuss any questions you have with your health care provider.  Document Released: 05/14/2005 Document Revised: 04/11/2016 Document Reviewed: 04/11/2016  Elsevier Interactive Patient Education © 2019 [employer]

## 2018-06-18 LAB — URINALYSIS, ROUTINE W REFLEX MICROSCOPIC
BILIRUBIN URINE: NEGATIVE
GLUCOSE, UA: NEGATIVE
Hgb urine dipstick: NEGATIVE
KETONES UR: NEGATIVE
Leukocytes, UA: NEGATIVE
Nitrite: NEGATIVE
Protein, ur: NEGATIVE
SPECIFIC GRAVITY, URINE: 1.009 (ref 1.001–1.03)
pH: 6 (ref 5.0–8.0)

## 2018-06-18 LAB — URINE CULTURE
MICRO NUMBER: 82953
SPECIMEN QUALITY:: ADEQUATE

## 2018-07-04 ENCOUNTER — Encounter: Payer: Self-pay | Admitting: Internal Medicine

## 2018-07-04 ENCOUNTER — Ambulatory Visit (INDEPENDENT_AMBULATORY_CARE_PROVIDER_SITE_OTHER): Payer: Medicare Other | Admitting: Internal Medicine

## 2018-07-04 VITALS — BP 160/90 | HR 80 | Ht 65.0 in | Wt 176.2 lb

## 2018-07-04 DIAGNOSIS — G4719 Other hypersomnia: Secondary | ICD-10-CM | POA: Diagnosis not present

## 2018-07-04 NOTE — Patient Instructions (Addendum)
I will order a new auto CPAP with pressure range 5-20. We may need to order a new sleep study in order to qualify you for a new CPAP device.   Sleep Apnea    Sleep apnea is disorder that affects a person's sleep. A person with sleep apnea has abnormal pauses in their breathing when they sleep. It is hard for them to get a good sleep. This makes a person tired during the day. It also can lead to other physical problems. There are three types of sleep apnea. One type is when breathing stops for a short time because your airway is blocked (obstructive sleep apnea). Another type is when the brain sometimes fails to give the normal signal to breathe to the muscles that control your breathing (central sleep apnea). The third type is a combination of the other two types.  HOME CARE   Take all medicine as told by your doctor.  Avoid alcohol, calming medicines (sedatives), and depressant drugs.  Try to lose weight if you are overweight. Talk to your doctor about a healthy weight goal.  Your doctor may have you use a device that helps to open your airway. It can help you get the air that you need. It is called a positive airway pressure (PAP) device.   MAKE SURE YOU:   Understand these instructions.  Will watch your condition.  Will get help right away if you are not doing well or get worse.  It may take approximately 1 month for you to get used to wearing her CPAP every night.  Be sure to work with your machine to get used to it, be patient, it may take time!  If you have trouble tolerating CPAP DO NOT RETURN YOUR MACHINE; Contact our office to see if we can help you tolerate the CPAP better first!

## 2018-07-04 NOTE — Addendum Note (Signed)
Addended by: Vivia Ewing on: 07/04/2018 09:54 AM   Modules accepted: Orders

## 2018-07-04 NOTE — Progress Notes (Signed)
[facility] Consultation      Assessment and Plan:  Obstructive sleep apnea. - History of obstructive sleep apnea diagnosed in 2014.  She recently moved here from [place] with an old machine, she notes that it is no longer blowing pressure as well as it previously did.  In addition the chip is not working and we cannot tell what the pressure is and cannot obtain a download from the machine. - We will order a new auto CPAP device with pressure range 5-20.  Insurance may require [place] to perform a new home sleep test in order to requalify her for CPAP.  Essential hypertension. - Sleep apnea can contribute to above condition, therefore treatment of sleep apnea is important part of this management.  Orders Placed This Encounter  Procedures  . Home sleep test     Date: 07/04/2018  MRN# [id number] Tonya Green 28-Feb-1951    Tonya Green is a 68 y.o. old female seen in consultation for chief complaint of:    Chief Complaint  Patient presents with  . Consult    Currently using cpap that she obtained on [place]. Recently moved.     HPI:   The patient is a 68 year old female with a history of obstructive sleep apnea.  Review of old records shows that the patient had a previous sleep study which appears to have been a split-night in 2014, at that time she had mild obstructive sleep apnea with an AHI of 11.9, she was recommended to be on a CPAP level of 6. They moved here from [place] in spring of 2019. Her current machine was used already when she got it, and it is very old and is not blowing as well as it used to.  Before she started it she was having issues with waking short winded. This improved once starting the CPAP.   **Home sleep test 12/12/2012, AHI of 8. **Outside sleep study 01/06/2013 mild obstructive sleep apnea with AHI of 11.9.  CPAP was titrated up to a level of 6.  CPAP at 6 was recommended.   PMHX:   Past Medical History:  Diagnosis Date  . Carotid artery  stenosis    mild L>R 03/26/16 [place]   . Diverticulosis    colonoscopy 10/13/16 [place]  . Factor 5 Leiden mutation, heterozygous ([place])   . History of blood transfusion    birth Rh factor   . Hyperlipidemia   . Hypertension   . Neuropathy    post surgical right leg s/p back surgery ruptured L4/5 disc repair with microdiscetomy   . OSA on CPAP   . Right leg DVT ([place])    2010 s/p back surgery was on Coumadin x 6 months   . Thyroid disease    hypothyroidism    Surgical Hx:  Past Surgical History:  Procedure Laterality Date  . APPENDECTOMY     2000  . BREAST SURGERY     biospy ? year   . ruptured disc repair     L4/5 07/5327 with complications residual right leg weakness/reduced sensation and DVT in 2010 post surgery    Family Hx:  Family History  Problem Relation Age of Onset  . Hypertension Mother   . Stroke Mother        in 45s   . Heart disease Father   . Stroke Father   . Cancer Brother        esophageal cancer   . Stroke Paternal Grandfather   . Hyperlipidemia Other  Social Hx:   Social History   Tobacco Use  . Smoking status: Never Smoker  . Smokeless tobacco: Never Used  Substance Use Topics  . Alcohol use: Yes  . Drug use: Never   Medication:    Current Outpatient Medications:  .  ALPHA LIPOIC ACID PO, Take 250 mg by mouth., Disp: , Rfl:  .  amLODipine (NORVASC) 2.5 MG tablet, Take 1 tablet (2.5 mg total) by mouth daily. In am if BP>140/?90 take another tablet, Disp: 180 tablet, Rfl: 3 .  Ascorbic Acid (VITAMIN C) 1000 MG tablet, Take 1,000 mg by mouth 3 (three) times daily., Disp: , Rfl:  .  aspirin EC 81 MG tablet, Take 81 mg by mouth daily., Disp: , Rfl:  .  Cholecalciferol (VITAMIN D3) 5000 units CAPS, Take by mouth., Disp: , Rfl:  .  diazepam (VALIUM) 2 MG tablet, 1/2 pill to 1 pill qhs prn, Disp: 30 tablet, Rfl: 2 .  gabapentin (NEURONTIN) 100 MG capsule, Take 2 capsules (200 mg total) by mouth 3 (three) times daily., Disp: 510 capsule, Rfl:  3 .  HYDROcodone-acetaminophen (NORCO/VICODIN) 5-325 MG tablet, Take 1 tablet by mouth as needed (only for flare ups). , Disp: , Rfl:  .  levothyroxine (SYNTHROID, LEVOTHROID) 50 MCG tablet, Take 1 tablet (50 mcg total) by mouth daily before breakfast., Disp: 90 tablet, Rfl: 3 .  MAGNESIUM MALATE PO, Take 625 mg by mouth., Disp: , Rfl:  .  Nattokinase 100 MG CAPS, Take 100 mg by mouth daily., Disp: , Rfl:  .  NON FORMULARY, [place] 500 mg qid  nattokinase 100 mg qd, Disp: , Rfl:  .  phenazopyridine (PYRIDIUM) 95 MG tablet, Take 1 tablet (95 mg total) by mouth 3 (three) times daily as needed for pain., Disp: 10 tablet, Rfl: 0 .  Strontium Chloride POWD, Use as directed 500 mg in the mouth or throat. , Disp: , Rfl:    Allergies:  Macrobid [nitrofurantoin macrocrystal] and Sulfa antibiotics  Review of Systems: Gen:  Denies  fever, sweats, chills HEENT: Denies blurred vision, double vision. bleeds, sore throat Cvc:  No dizziness, chest pain. Resp:   Denies cough or sputum production, shortness of breath Gi: Denies swallowing difficulty, stomach pain. Gu:  Denies bladder incontinence, burning urine Ext:   No Joint pain, stiffness. Skin: No skin rash,  hives  Endoc:  No polyuria, polydipsia. Psych: No depression, insomnia. Other:  All other systems were reviewed with the patient and were negative other that what is mentioned in the HPI.   Physical Examination:   VS: BP (!) 160/90   Pulse 80   Ht 5\' 5"  (1.651 m)   Wt 176 lb 3.2 oz (79.9 kg)   SpO2 97%   BMI 29.32 kg/m   General Appearance: No distress  Neuro:without focal findings,  speech normal,  HEENT: PERRLA, EOM intact.   Pulmonary: normal breath sounds, No wheezing.  CardiovascularNormal S1,S2.  No m/r/g.   Abdomen: Benign, Soft, non-tender. Renal:  No costovertebral tenderness  GU:  No performed at this time. Endoc: No evident thyromegaly, no signs of acromegaly. Skin:   warm, no rashes, no ecchymosis  Extremities:  normal, no cyanosis, clubbing.  Other findings:    LABORATORY PANEL:   CBC No results for input(s): WBC, HGB, HCT, PLT in the last 168 hours. ------------------------------------------------------------------------------------------------------------------  Chemistries  No results for input(s): NA, K, CL, CO2, GLUCOSE, BUN, CREATININE, CALCIUM, MG, AST, ALT, ALKPHOS, BILITOT in the last 168 hours.  Invalid input(s): GFRCGP ------------------------------------------------------------------------------------------------------------------  Cardiac Enzymes No results for input(s): TROPONINI in the last 168 hours. ------------------------------------------------------------  RADIOLOGY:  No results found.     Thank  you for the consultation and for allowing [facility] to assist in the care of your patient. Our recommendations are noted above.  Please contact us if we can be of further service.   Marda Stalker, M.D., F.C.C.P.  Board Certified in Internal Medicine, Pulmonary Medicine, [place], and Sleep Medicine.  [facility] Office Number: [phone]   07/04/2018

## 2018-07-23 ENCOUNTER — Ambulatory Visit: Payer: Medicare Other | Attending: Internal Medicine | Admitting: Physical Therapy

## 2018-07-23 ENCOUNTER — Encounter: Payer: Self-pay | Admitting: Physical Therapy

## 2018-07-23 ENCOUNTER — Other Ambulatory Visit: Payer: Self-pay

## 2018-07-23 DIAGNOSIS — G8929 Other chronic pain: Secondary | ICD-10-CM | POA: Diagnosis present

## 2018-07-23 DIAGNOSIS — M6281 Muscle weakness (generalized): Secondary | ICD-10-CM

## 2018-07-23 DIAGNOSIS — R2689 Other abnormalities of gait and mobility: Secondary | ICD-10-CM | POA: Insufficient documentation

## 2018-07-23 DIAGNOSIS — M545 Low back pain: Secondary | ICD-10-CM | POA: Insufficient documentation

## 2018-07-23 NOTE — Patient Instructions (Addendum)
  Proper body mechanics with getting out of a chair to decrease strain  on back &pelvic floor   Avoid holding your breath when Getting out of the chair:  Scoot to front part of chair chair Heels behind feet, feet are hip width apart, nose over toes  Inhale like you are smelling roses Exhale to stand pushing off chair with hands Not on thighs     ___   Open book ( handout)   Deep core level 1 ( handout)   ___    Standing:  10 reps on both sides x 3 x day     3 point tap   Feet are hip width Tap forward, center under hip not feet next to each other  Tap middle\, center  Tap back       _Figure-4 and then toe touch to the ground behind you along a diagonal

## 2018-07-24 NOTE — Therapy (Addendum)
[place] MAIN [facility] SERVICES [address] [place], [place], [address] Phone: [phone]   Fax:  [phone]  Physical Therapy Evaluation  Patient Details  Name: Tonya Green MRN: [id number] Date of Birth: November 02, 1950 Referring Provider (PT): McLean-Scocuzza   Encounter Date: 07/23/2018  PT End of Session - 07/24/18 2021    Visit Number  1    Number of Visits  10    Date for PT Re-Evaluation  10/02/18    PT Start Time  1205    PT Stop Time  1300    PT Time Calculation (min)  55 min    Activity Tolerance  Patient tolerated treatment well    Behavior During Therapy  [facility] for tasks assessed/performed       Past Medical History:  Diagnosis Date  . Carotid artery stenosis    mild L>R 03/26/16 [place]   . Diverticulosis    colonoscopy 10/13/16 [place]  . Factor 5 Leiden mutation, heterozygous ([place])   . History of blood transfusion    birth Rh factor   . Hyperlipidemia   . Hypertension   . Neuropathy    post surgical right leg s/p back surgery ruptured L4/5 disc repair with microdiscetomy   . OSA on CPAP   . Right leg DVT ([place])    2010 s/p back surgery was on Coumadin x 6 months   . Thyroid disease    hypothyroidism     Past Surgical History:  Procedure Laterality Date  . APPENDECTOMY     2000  . BREAST SURGERY     biospy ? year   . ruptured disc repair     L4/5 0/0762 with complications residual right leg weakness/reduced sensation and DVT in 2010 post surgery     There were no vitals filed for this visit.   Subjective Assessment - 07/24/18 2020    Subjective 1) R leg currently has numbness/ tingling/ burning that is constant. "It feels like after an arm falls asleep , 1/3 never came back".  The problems she has include R foot dropping after long walks,  when someone taps her on the back/ riding in a car bouncing that she has to stiffen up, flare-ups in the R foot "like a jumper cable" every 2-3 months.   Pt uses a frequency  specific current modulator and pain medications to manage the foot flare up. When flare up occurs, pt can not walk and she has to hang onto anything. Denied major changes to bladder and bowel.  Currently workout: walk 3- 4x/ week 7235 steps in hour, 2days a week in the pool 30 min with back stroke, walking breast stroke,  and PT exercises.    2) difficulty sleep due to pain. Pain with sleeping on her back. Pt uses CPAP.        Pertinent History  Pt had a complete rupture of L4-5 disk in 2010 when gardening. Pt lost the use of the R leg prior and after micrdisectomy. Pt had a walker for months and underwent PT.  It took 1 year to return to walking without a cane.  Gynecological Hx: 2 vaginal deliveries with tears that tore both sphincter. Additional surgeries: tubal ligation and appendectomy. Denied injuries to knee/ ankle       Patient Stated Goals  do breast stroke , crawl in the pool, aerobics, and hike 13,000 ft in elevation and on uneven terrain.           [facility] PT Assessment -  07/24/18 2021      Assessment   Medical Diagnosis  LBP sciatica    Referring Provider (PT)  McLean-Scocuzza      Precautions   Precautions  None      Restrictions   Weight Bearing Restrictions  No      Home Environment   Living Environment  Other (Comment)    Additional Comments  [facility]       Prior Function   Level of Independence  Independent      Observation/Other Assessments   Observations  heeling walking with BUE support with difficulty .  LEg length 87 cm B       Sit to Stand   Comments  genu valgus B       AROM   Overall AROM Comments  R hallux valgus from 10 deg to < 5 deg      Strength   Overall Strength Comments  R hip flex 3-/5, knee ext 4-/5, knee flexion 4+/5, L 5/5 all MMT.    L ankle DF/EV 4-/5, R 5/5.   OKC PF/INV, DF 5/5 B  Standing PF with single UE R 4 reps 2/5, L 7 reps 4/5   prone: hip ext R 2/5, L 4/5           Palpation   Spinal mobility   25% rotation B,  post Tx 45 %     SI assessment   standing, R ilic crest higher. prone: R SIJ hypomobile     Palpation comment  R SIJ hypomobile, limited nutation, tenderness along lateral border R  . Significantly increased spinal hypomobility with paraspinal tightness      Ambulation/Gait   Gait Comments  R hip sway, decreased hip flexion. knee flexion,                 Objective measurements completed on examination: See above findings.      [place] Adult PT Treatment/Exercise - 07/24/18 2021      Neuro Re-ed    Neuro Re-ed Details   deep core level 1, cues for sit to stand to minimize genu valgus       Moist Heat Therapy   Moist Heat Location  Lumbar Spine      Manual Therapy   Manual therapy comments  AP Mob Grade III at R SIJ, to faciliate nutation                   PT Long Term Goals - 07/23/18 1228      PT LONG TERM GOAL #1   Title  Pt will decrease her ODI score from % to < % to improve ADLs and hobbies     Time  10    Period  Weeks    Status  New    Target Date  10/01/18      PT LONG TERM GOAL #2   Title  Pt will decrease her PSQI score from % to < % to improve quality of sleep with less interruptions of pain     Time  8    Period  Weeks    Status  New    Target Date  09/17/18      PT LONG TERM GOAL #3   Title  Pt will have a change in R hallux valgus from 10 deg to < 5 deg in order to strengthen push off in gait     Time  4    Period  Weeks  Status  New    Target Date  08/20/18      PT LONG TERM GOAL #4   Title  Pt will demo increased SIJ mobility R and report of no tenderness with palpation across 2 visits in order to minimize pain     Time  4    Period  Weeks    Status  New    Target Date  08/21/18      PT LONG TERM GOAL #5   Title  Pt will demo increased thoracic mobility in rotation and sideflexion in order to normalize reciporcal pattern of thorax and pelvis to restore gait and increase gait speed to progress to hiking on  uneven terrain     Time  2    Period  Weeks    Status  New    Target Date  08/07/18      Additional Long Term Goals   Additional Long Term Goals  Yes      PT LONG TERM GOAL #6   Title  Pt will demo dynamic balance stability with use of hiking poles when ascending /descending hills in order to integrate to hiking easy to moderate trails. .     Time  8    Period  Weeks    Status  New    Target Date  09/18/18             Plan - 07/24/18 2022    Clinical Impression Statement Pt is a 68 yo female who reports constant R leg currently has numbness/ tingling/ burning that is constant,  R foot dropping after long walks, and difficulty sleeping due to pain when sleeping on her back.  Contributing factors to her Sx:  Pt had a complete rupture of L4-5 disk in 2010 when gardening. Pt lost the use of the R leg prior and after micrdisectomy. Pt had a walker for months and underwent PT.  It took 1 year to return to walking without a cane.  Gynecological Hx: 2 vaginal deliveries with tears that tore both sphincter. Additional surgeries: tubal ligation and appendectomy. Denied injuries to knee/ ankle       Clinical presentations include gait devaitions with R hip sway, tenderness with palpation at R SIJ border, hypomobile SIJ mobility, single leg stance instability, weakness of hip mm on R, B genu valgus,  increased spinal hypomobility with paraspinal tightness.  Following Tx today, pt demo'd increased SIJ mobility and improved stride length/ hip ext but hip flexion in gait still caused pain. Plan to address spinal mobility at next session and assess tailbone given pt's Hx of broken tailbone.        Clinical Presentation  Evolving    Clinical Decision Making  Moderate    Rehab Potential  Good    PT Frequency  1x / week    PT Duration  Other (comment)   10   PT Treatment/Interventions  ADLs/Self Care Home Management;Aquatic Therapy;Wellsite geologist;Therapeutic  exercise;Therapeutic activities;Gait training;Orthotic Fit/Training;Manual techniques;Patient/family education;Functional mobility training;Manual lymph drainage;Scar mobilization;Taping;Energy conservation;Neuromuscular re-education    Consulted and Agree with Plan of Care  Patient       Patient will benefit from skilled therapeutic intervention in order to improve the following deficits and impairments:  Abnormal gait, Difficulty walking, Decreased safety awareness, Decreased coordination, Decreased balance, Decreased cognition, Decreased endurance, Decreased range of motion, Decreased strength, Decreased skin integrity, Increased muscle spasms, Postural dysfunction, Hypomobility, Improper body mechanics, Increased fascial restricitons, Pain, Decreased mobility, Decreased activity tolerance  Visit Diagnosis: Chronic right-sided low back pain without sciatica  Muscle weakness (generalized)  Other abnormalities of gait and mobility     Problem List Patient Active Problem List   Diagnosis Date Noted  . Chronic low back pain with sciatica 06/17/2018  . Lumbar radiculopathy 12/18/2017  . Hyponatremia 12/18/2017  . Leukocytosis 12/18/2017  . Muscle spasm 12/18/2017  . OSA on CPAP 11/01/2017  . History of skin cancer 11/01/2017  . Carotid artery disease ([place]) 11/01/2017  . HTN (hypertension) 11/01/2017  . HLD (hyperlipidemia) 11/01/2017  . Heterozygous factor V Leiden mutation ([place]) 11/01/2017  . Hypothyroidism 11/01/2017  . Osteopenia 11/01/2017  . Osteoporosis 11/01/2017    Jerl Mina ,PT, DPT, E-RYT  07/24/2018, 8:29 PM  [place] MAIN [facility] SERVICES [address] [place], [place], [address] Phone: [phone]   Fax:  [phone]  Name: Tonya Green MRN: [id number] Date of Birth: 1950-05-30

## 2018-07-26 ENCOUNTER — Encounter: Payer: Self-pay | Admitting: Internal Medicine

## 2018-07-28 ENCOUNTER — Ambulatory Visit: Payer: Medicare Other | Attending: Internal Medicine | Admitting: Physical Therapy

## 2018-07-28 DIAGNOSIS — M545 Low back pain, unspecified: Secondary | ICD-10-CM

## 2018-07-28 DIAGNOSIS — G8929 Other chronic pain: Secondary | ICD-10-CM | POA: Insufficient documentation

## 2018-07-28 DIAGNOSIS — M6281 Muscle weakness (generalized): Secondary | ICD-10-CM

## 2018-07-28 DIAGNOSIS — R2689 Other abnormalities of gait and mobility: Secondary | ICD-10-CM | POA: Diagnosis present

## 2018-07-28 NOTE — Patient Instructions (Addendum)
  Clam Shell 45 Degrees on your side 10 reps each side   Complimentary stretch: Cross thigh over thigh, exhale to hug the thighs in with arms pulling back of thigh, shoulders/ head is relaxed down , Use towel behind thigh is needed.  10 reps    Deep core level 2 *(handout)     Modify childs pose to  childs pose rocking  10 reps Be sure to place hands shoudler width apart, palms down, relax shoulder blades down and back    Walking with higher thighs, no more heel toe Let your trunk rotate slightly as you walk

## 2018-07-29 NOTE — Therapy (Signed)
[place] MAIN [facility] SERVICES [address] [place], [place], [address] Phone: [phone]   Fax:  [phone]  Physical Therapy Treatment  Patient Details  Name: Tonya Green MRN: [id number] Date of Birth: 1950/09/28 Referring Provider (PT): McLean-Scocuzza   Encounter Date: 07/28/2018  PT End of Session - 07/28/18 1813    Visit Number  2    Number of Visits  10    Date for PT Re-Evaluation  10/02/18    PT Start Time  5188    PT Stop Time  1813    PT Time Calculation (min)  63 min    Activity Tolerance  Patient tolerated treatment well    Behavior During Therapy  [facility] for tasks assessed/performed       Past Medical History:  Diagnosis Date  . Carotid artery stenosis    mild L>R 03/26/16 [place]   . Diverticulosis    colonoscopy 10/13/16 [place]  . Factor 5 Leiden mutation, heterozygous ([place])   . History of blood transfusion    birth Rh factor   . Hyperlipidemia   . Hypertension   . Neuropathy    post surgical right leg s/p back surgery ruptured L4/5 disc repair with microdiscetomy   . OSA on CPAP   . Right leg DVT ([place])    2010 s/p back surgery was on Coumadin x 6 months   . Thyroid disease    hypothyroidism     Past Surgical History:  Procedure Laterality Date  . APPENDECTOMY     2000  . BREAST SURGERY     biospy ? year   . ruptured disc repair     L4/5 08/1658 with complications residual right leg weakness/reduced sensation and DVT in 2010 post surgery     There were no vitals filed for this visit.  Subjective Assessment - 07/28/18 1708    Subjective  Pt had increased pain after the last session but no flare ups. Pt noticed tenderness in her lower gluts     Pertinent History  Pt had a complete rupture of L4-5 disk in 2010 when gardening. Pt lost the use of the R leg prior and after micrdisectomy. Pt had a walker for months and underwent PT.  It took 1 year to return to walking without a cane.  Gynecological Hx: 2 vaginal  deliveries with tears that tore both sphincter. Additional surgeries: tubal ligation and appendectomy. Denied injuries to knee/ ankle       Patient Stated Goals  do breast stroke , crawl in the pool, aerobics, and hike 13,000 ft in elevation and on uneven terrain.           [facility] PT Assessment - 07/29/18 0940      Sit to Stand   Comments  proper knee alignment without use of hands       Strength   Overall Strength Comments  hip abd B  3/5       Palpation   Spinal mobility  hypomobile T3-10 segments     SI assessment   iliac crest equally levelled in standing     Palpation comment  tenderness at S4 level lateral border w/ report of radiating pain at R calf and 3 lateral toes       Ambulation/Gait   Gait Comments  no R hip sway, minimal hip flexion but more knee flexion, longer strides minimal trunk rotation                 Pelvic  Floor Special Questions - 07/29/18 0942    External Perineal Exam  flexed coccyx , tenderness  at distal lateral aspect of sacrum         [facility] Adult PT Treatment/Exercise - 07/29/18 0940      Neuro Re-ed    Neuro Re-ed Details   cued for technique with new strengthening HEP ( clams, and deep core level 2)       Moist Heat Therapy   Number Minutes Moist Heat  10 Minutes    Moist Heat Location  --   sacrum/ thoracic spine      Manual Therapy   Manual therapy comments  AP mob Grade II FADDIR/ER within small ROM, PA mob at T3-10 Grade III, STM at interspinals B ,  superior glides at sacrococcygeal ligament B                  PT Long Term Goals - 07/29/18 2130      PT LONG TERM GOAL #1   Title  Pt will decrease her ODI score from % to < % to improve ADLs and hobbies     Time  10    Period  Weeks    Status  New      PT LONG TERM GOAL #2   Title  Pt will decrease her PSQI score from % to < % to improve quality of sleep with less interruptions of pain     Time  8    Period  Weeks    Status  New      PT LONG TERM GOAL #3    Title  Pt will have a change in R hallux valgus from 10 deg to < 5 deg in order to strengthen push off in gait     Time  4    Period  Weeks    Status  New      PT LONG TERM GOAL #4   Title  Pt will demo increased SIJ mobility R and report of no tenderness with palpation across 2 visits in order to minimize pain     Time  4    Period  Weeks    Status  Achieved      PT LONG TERM GOAL #5   Title  Pt will demo increased thoracic mobility in rotation and sideflexion in order to normalize reciporcal pattern of thorax and pelvis to restore gait and increase gait speed to progress to hiking on uneven terrain     Time  2    Period  Weeks    Status  New      Additional Long Term Goals   Additional Long Term Goals  Yes      PT LONG TERM GOAL #6   Title  Pt will demo dynamic balance stability with use of hiking poles when ascending /descending hills in order to integrate to hiking easy to moderate trails. .     Time  8    Period  Weeks    Status  New      PT LONG TERM GOAL #7   Title  Pt will demo less flexed coccyx across 2 visits in order to improve gait ( longer stride and increased hip flexion with less pain)     Time  4    Period  Weeks    Status  New             Plan - 07/28/18 1814    Clinical Impression Statement  Pt achieved increased sacroiliac joint mobility and less hip sway compared to last session. Today, addressed flexed coccyx with external manual Tx.  Palpation along R lateral border of coccyx/SIJ produced radicular Sx along leg which pt reported commonly happens. While fascial mobility of coccyx was achieved post Tx, pt reported she could not feel her R foot which is common but this numbness did not alter her gait.  Pt's gait was improved post Tx with increased stride, increased hip flexion/ stronger push off. Plan apply neural glides to address R leg numbness at next session and continue to correct flexed coccyx. Suspect by restoring mobility of coccyx and nutation of  sacrum, pt will have less pain and continue to make improvements. Pt continues to benefit from skilled PT.      Rehab Potential  Good    PT Frequency  1x / week    PT Duration  Other (comment)   10   PT Treatment/Interventions  ADLs/Self Care Home Management;Aquatic Therapy;Wellsite geologist;Therapeutic exercise;Therapeutic activities;Gait training;Orthotic Fit/Training;Manual techniques;Patient/family education;Functional mobility training;Manual lymph drainage;Scar mobilization;Taping;Energy conservation;Neuromuscular re-education    Consulted and Agree with Plan of Care  Patient       Patient will benefit from skilled therapeutic intervention in order to improve the following deficits and impairments:  Abnormal gait, Difficulty walking, Decreased safety awareness, Decreased coordination, Decreased balance, Decreased cognition, Decreased endurance, Decreased range of motion, Decreased strength, Decreased skin integrity, Increased muscle spasms, Postural dysfunction, Hypomobility, Improper body mechanics, Increased fascial restricitons, Pain, Decreased mobility, Decreased activity tolerance  Visit Diagnosis: Chronic right-sided low back pain without sciatica  Other abnormalities of gait and mobility  Muscle weakness (generalized)     Problem List Patient Active Problem List   Diagnosis Date Noted  . Chronic low back pain with sciatica 06/17/2018  . Lumbar radiculopathy 12/18/2017  . Hyponatremia 12/18/2017  . Leukocytosis 12/18/2017  . Muscle spasm 12/18/2017  . OSA on CPAP 11/01/2017  . History of skin cancer 11/01/2017  . Carotid artery disease ([place]) 11/01/2017  . HTN (hypertension) 11/01/2017  . HLD (hyperlipidemia) 11/01/2017  . Heterozygous factor V Leiden mutation ([place]) 11/01/2017  . Hypothyroidism 11/01/2017  . Osteopenia 11/01/2017  . Osteoporosis 11/01/2017    Jerl Mina ,PT, DPT, E-RYT  07/29/2018, 9:51 AM  [place] MAIN [facility] SERVICES [address] [place], [place], [address] Phone: [phone]   Fax:  [phone]  Name: Tannisha Kennington MRN: [id number] Date of Birth: 1951-03-07

## 2018-07-29 NOTE — Addendum Note (Signed)
Addended by: Jerl Mina on: 07/29/2018 03:04 PM   Modules accepted: Orders

## 2018-08-06 ENCOUNTER — Other Ambulatory Visit: Payer: Self-pay

## 2018-08-06 ENCOUNTER — Ambulatory Visit: Payer: Medicare Other | Admitting: Physical Therapy

## 2018-08-06 DIAGNOSIS — M545 Low back pain: Secondary | ICD-10-CM | POA: Diagnosis not present

## 2018-08-06 DIAGNOSIS — R2689 Other abnormalities of gait and mobility: Secondary | ICD-10-CM

## 2018-08-06 DIAGNOSIS — G8929 Other chronic pain: Secondary | ICD-10-CM

## 2018-08-06 DIAGNOSIS — M6281 Muscle weakness (generalized): Secondary | ICD-10-CM

## 2018-08-06 NOTE — Patient Instructions (Signed)
To decrease nerve flare-up,  Short leg ( knee knee to chest)   ___   Stretch for pelvic floor   "v heels slide away and then back toward buttocks and then rock knee to slight ,  slide heel along at 11 o clock away from buttocks   10 reps   ____  Less chest breathing, notice expansion of ribs to allow for pelvic floor to lower like a basket in inhale

## 2018-08-07 NOTE — Therapy (Signed)
[place] MAIN [facility] SERVICES [address] [place], [place], [address] Phone: [phone]   Fax:  [phone]  Physical Therapy Treatment  Patient Details  Name: Tonya Green MRN: [id number] Date of Birth: 1951-03-21 Referring Provider (PT): McLean-Scocuzza   Encounter Date: 08/06/2018  PT End of Session - 08/06/18 1257    Visit Number  3    Number of Visits  10    Date for PT Re-Evaluation  09/24/18    PT Start Time  1202    PT Stop Time  1300    PT Time Calculation (min)  58 min    Activity Tolerance  Patient tolerated treatment well    Behavior During Therapy  [facility] for tasks assessed/performed       Past Medical History:  Diagnosis Date  . Carotid artery stenosis    mild L>R 03/26/16 [place]   . Diverticulosis    colonoscopy 10/13/16 [place]  . Factor 5 Leiden mutation, heterozygous ([place])   . History of blood transfusion    birth Rh factor   . Hyperlipidemia   . Hypertension   . Neuropathy    post surgical right leg s/p back surgery ruptured L4/5 disc repair with microdiscetomy   . OSA on CPAP   . Right leg DVT ([place])    2010 s/p back surgery was on Coumadin x 6 months   . Thyroid disease    hypothyroidism     Past Surgical History:  Procedure Laterality Date  . APPENDECTOMY     2000  . BREAST SURGERY     biospy ? year   . ruptured disc repair     L4/5 06/2977 with complications residual right leg weakness/reduced sensation and DVT in 2010 post surgery     There were no vitals filed for this visit.  Subjective Assessment - 08/07/18 1458    Subjective  Pt reported she feels sore when she performs the exercises. Pt practiced less heel walking in the pool. The radiating pain down to R calf never goes away. "There is an electric current that is constant."  Pt did not get a flare up of the radiating pain after last session but the leg was pretty numb.     Pertinent History  Pt had a complete rupture of L4-5 disk in 2010 when  gardening. Pt lost the use of the R leg prior and after micrdisectomy. Pt had a walker for months and underwent PT.  It took 1 year to return to walking without a cane.  Gynecological Hx: 2 vaginal deliveries with tears that tore both sphincter. Additional surgeries: tubal ligation and appendectomy. Denied injuries to knee/ ankle       Patient Stated Goals  do breast stroke , crawl in the pool, aerobics, and hike 13,000 ft in elevation and on uneven terrain.           [facility] PT Assessment - 08/07/18 1457      Sensation   Light Touch  Impaired Detail    Light Touch Impaired Details  --   L5/S1 decreased on R       Coordination   Gross Motor Movements are Fluid and Coordinated  --   excessive chest breathing      Palpation   Spinal mobility  increased mobilty in T3-10    SI assessment   coccyx flexed     Palpation comment  no tenderness at S4 level lateral border w/no report of radiating pain at R calf and  3 lateral toes       Ambulation/Gait   Gait Comments  increased stride length, increased hip/ knee flexion ,                 Pelvic Floor Special Questions - 08/07/18 1458    External Perineal Exam  noted B sacroccygeal ligament/ coccgyeus mm tightenss / medial tuberosity area     Prolapse  Anterior Wall   slightly distal to pubic symphysis within introitus   Pelvic Floor Internal Exam  Pt consented verbally without contraindications     Exam Type  Vaginal    Palpation  increased tightenss 1-3 layers 4, 7, 11, 1 o'clock.  reproduction of nerve flare-up with palpation at ATLA  , decreased with shortening of pudendal nn with FADDIR position R hip.      Strength  fair squeeze, definite lift        [facility] Adult PT Treatment/Exercise - 08/07/18 1457      Neuro Re-ed    Neuro Re-ed Details   cued for less chest breathing, cued for pelvic floor lengthening       Moist Heat Therapy   Moist Heat Location  Other (comment)   perineum     Manual Therapy   Manual therapy  comments  externally: STM/ MWM  B sacroccygeal ligament/ coccgyeus mm tightenss / medial tuberosity area     Internal Pelvic Floor  STM/ MWM on B areas noted in assessment , pudendal neural glides on R                   PT Long Term Goals - 07/29/18 1610      PT LONG TERM GOAL #1   Title  Pt will decrease her ODI score from 30% to <20 % to improve ADLs and hobbies     Time  10    Period  Weeks    Status  New      PT LONG TERM GOAL #2   Title  Pt will decrease her PSQI score from 29 % to <19 % to improve quality of sleep with less interruptions of pain     Time  8    Period  Weeks    Status  New      PT LONG TERM GOAL #3   Title  Pt will have a change in R hallux valgus from 10 deg to < 5 deg in order to strengthen push off in gait     Time  4    Period  Weeks    Status  New      PT LONG TERM GOAL #4   Title  Pt will demo increased SIJ mobility R and report of no tenderness with palpation across 2 visits in order to minimize pain     Time  4    Period  Weeks    Status  Achieved      PT LONG TERM GOAL #5   Title  Pt will demo increased thoracic mobility in rotation and sideflexion in order to normalize reciporcal pattern of thorax and pelvis to restore gait and increase gait speed to progress to hiking on uneven terrain     Time  2    Period  Weeks    Status  New      Additional Long Term Goals   Additional Long Term Goals  Yes      PT LONG TERM GOAL #6   Title  Pt will demo dynamic balance  stability with use of hiking poles when ascending /descending hills in order to integrate to hiking easy to moderate trails. .     Time  8    Period  Weeks    Status  New      PT LONG TERM GOAL #7   Title  Pt will demo less flexed coccyx across 2 visits in order to improve gait ( longer stride and increased hip flexion with less pain)     Time  4    Period  Weeks    Status  New            Plan - 08/07/18 1459    Clinical Impression Statement Pt's spinal/ SIJ  mobility is improving.  Pt demo'd decreased pelvic floor tightness after intravaginal and external manual Tx. Pt benefited from leg positions that shortened pudendal nn to minimize leg flare-ups of numbness/ radiating at calf. Gait mechanics are improving with longer strides and hip mobility. Pt continues to require SIJ mobility and plan to perform neural glides to address radiculopathy.    Pt continues to benefit from skilled PT.    Rehab Potential  Good    PT Frequency  1x / week    PT Duration  Other (comment)   10   PT Treatment/Interventions  ADLs/Self Care Home Management;Aquatic Therapy;Wellsite geologist;Therapeutic exercise;Therapeutic activities;Gait training;Orthotic Fit/Training;Manual techniques;Patient/family education;Functional mobility training;Manual lymph drainage;Scar mobilization;Taping;Energy conservation;Neuromuscular re-education    Consulted and Agree with Plan of Care  Patient       Patient will benefit from skilled therapeutic intervention in order to improve the following deficits and impairments:  Abnormal gait, Difficulty walking, Decreased safety awareness, Decreased coordination, Decreased balance, Decreased cognition, Decreased endurance, Decreased range of motion, Decreased strength, Decreased skin integrity, Increased muscle spasms, Postural dysfunction, Hypomobility, Improper body mechanics, Increased fascial restricitons, Pain, Decreased mobility, Decreased activity tolerance  Visit Diagnosis: Other abnormalities of gait and mobility  Chronic right-sided low back pain without sciatica  Muscle weakness (generalized)     Problem List Patient Active Problem List   Diagnosis Date Noted  . Chronic low back pain with sciatica 06/17/2018  . Lumbar radiculopathy 12/18/2017  . Hyponatremia 12/18/2017  . Leukocytosis 12/18/2017  . Muscle spasm 12/18/2017  . OSA on CPAP 11/01/2017  . History of skin cancer 11/01/2017  . Carotid  artery disease ([place]) 11/01/2017  . HTN (hypertension) 11/01/2017  . HLD (hyperlipidemia) 11/01/2017  . Heterozygous factor V Leiden mutation ([place]) 11/01/2017  . Hypothyroidism 11/01/2017  . Osteopenia 11/01/2017  . Osteoporosis 11/01/2017    Jerl Mina ,PT, DPT, E-RYT  08/07/2018, 3:00 PM  [place] MAIN [facility] SERVICES [address] [place], [place], [address] Phone: [phone]   Fax:  [phone]  Name: Tonya Green MRN: [id number] Date of Birth: 10/21/1950

## 2018-08-13 ENCOUNTER — Ambulatory Visit: Payer: Medicare Other | Admitting: Physical Therapy

## 2018-08-19 ENCOUNTER — Telehealth: Payer: Self-pay | Admitting: Physical Therapy

## 2018-08-19 NOTE — Telephone Encounter (Signed)
DPT phoned pt to f/u about closure of clinic due COVID-19 for next 2 weeks. Discussed how pt responded to last session which she reported feeling uncomfortable but within the next few days, pt felt better with no increased pain. Pt has not had a flare-up in her R leg for across the past 5 months when she used to have flare-ups 2-3 months.   Pt has been doing her HEP without difficulty.   Pt had a UTI this past Sunday but she did not been able to see her PCP . Pt has been using Mannose and she feels it has cleared her UTI.   Plan to communicate with pt when clinic opens again.

## 2018-08-20 ENCOUNTER — Ambulatory Visit: Payer: Medicare Other | Admitting: Physical Therapy

## 2018-08-21 ENCOUNTER — Telehealth: Payer: Self-pay

## 2018-08-21 ENCOUNTER — Other Ambulatory Visit: Payer: Self-pay

## 2018-08-21 ENCOUNTER — Telehealth (INDEPENDENT_AMBULATORY_CARE_PROVIDER_SITE_OTHER): Payer: Medicare Other | Admitting: Internal Medicine

## 2018-08-21 ENCOUNTER — Ambulatory Visit (INDEPENDENT_AMBULATORY_CARE_PROVIDER_SITE_OTHER): Payer: Medicare Other | Admitting: Internal Medicine

## 2018-08-21 DIAGNOSIS — N3000 Acute cystitis without hematuria: Secondary | ICD-10-CM

## 2018-08-21 DIAGNOSIS — N390 Urinary tract infection, site not specified: Secondary | ICD-10-CM | POA: Insufficient documentation

## 2018-08-21 HISTORY — DX: Urinary tract infection, site not specified: N39.0

## 2018-08-21 MED ORDER — CIPROFLOXACIN HCL 250 MG PO TABS: 250.0000 mg | ORAL_TABLET | Freq: Two times a day (BID) | ORAL | 0 refills | Status: DC

## 2018-08-21 NOTE — Telephone Encounter (Signed)
Telephone encounter    Patient agreed to telephone visit and is aware that copayment and coinsurance may apply. Patient was treated using telemedicine according to accepted telemedicine protocols.  Subjective:   Patient complains of burning with urination and cloudy urine since Sunday no blood in urine unable to bring in urine due to [facility] on lockdown and cant leave to bring a urine   Patient Active Problem List   Diagnosis Date Noted  . UTI (urinary tract infection) 08/21/2018  . Chronic low back pain with sciatica 06/17/2018  . Lumbar radiculopathy 12/18/2017  . Hyponatremia 12/18/2017  . Leukocytosis 12/18/2017  . Muscle spasm 12/18/2017  . OSA on CPAP 11/01/2017  . History of skin cancer 11/01/2017  . Carotid artery disease ([place]) 11/01/2017  . HTN (hypertension) 11/01/2017  . HLD (hyperlipidemia) 11/01/2017  . Heterozygous factor V Leiden mutation ([place]) 11/01/2017  . Hypothyroidism 11/01/2017  . Osteopenia 11/01/2017  . Osteoporosis 11/01/2017   Social History   Tobacco Use  . Smoking status: Never Smoker  . Smokeless tobacco: Never Used  Substance Use Topics  . Alcohol use: Yes    Current Outpatient Medications:  .  ALPHA LIPOIC ACID PO, Take 250 mg by mouth., Disp: , Rfl:  .  amLODipine (NORVASC) 2.5 MG tablet, Take 1 tablet (2.5 mg total) by mouth daily. In am if BP>140/?90 take another tablet, Disp: 180 tablet, Rfl: 3 .  Ascorbic Acid (VITAMIN C) 1000 MG tablet, Take 1,000 mg by mouth 3 (three) times daily., Disp: , Rfl:  .  aspirin EC 81 MG tablet, Take 81 mg by mouth daily., Disp: , Rfl:  .  Cholecalciferol (VITAMIN D3) 5000 units CAPS, Take by mouth., Disp: , Rfl:  .  ciprofloxacin (CIPRO) 250 MG tablet, Take 1 tablet (250 mg total) by mouth 2 (two) times daily. With food x 3-5 days stop once symptoms improved, Disp: 10 tablet, Rfl: 0 .  diazepam (VALIUM) 2 MG tablet, 1/2 pill to 1 pill qhs prn, Disp: 30 tablet, Rfl: 2 .  gabapentin (NEURONTIN) 100 MG  capsule, Take 2 capsules (200 mg total) by mouth 3 (three) times daily., Disp: 510 capsule, Rfl: 3 .  HYDROcodone-acetaminophen (NORCO/VICODIN) 5-325 MG tablet, Take 1 tablet by mouth as needed (only for flare ups). , Disp: , Rfl:  .  levothyroxine (SYNTHROID, LEVOTHROID) 50 MCG tablet, Take 1 tablet (50 mcg total) by mouth daily before breakfast., Disp: 90 tablet, Rfl: 3 .  MAGNESIUM MALATE PO, Take 625 mg by mouth., Disp: , Rfl:  .  Nattokinase 100 MG CAPS, Take 100 mg by mouth daily., Disp: , Rfl:  .  NON FORMULARY, Mannose 500 mg qid  nattokinase 100 mg qd, Disp: , Rfl:  .  phenazopyridine (PYRIDIUM) 95 MG tablet, Take 1 tablet (95 mg total) by mouth 3 (three) times daily as needed for pain., Disp: 10 tablet, Rfl: 0 .  Strontium Chloride POWD, Use as directed 500 mg in the mouth or throat. , Disp: , Rfl:   Allergies  Allergen Reactions  . Macrobid [Nitrofurantoin Macrocrystal]     Nausea and sob   . Sulfa Antibiotics     Swelling      Assessment and Plan:   Diagnosis: UTI. Please see myChart communication and orders below.   Cipro 250 mg bid x 3-5 days stop if better  No urine culture obtained pt unable to bring in speciman see HPI   Nino Glow McLean-Scocuzza, MD 08/21/2018  A total of 10 minutes were spent  by me to personally review the patient-generated inquiry, review patient records and data pertinent to assessment of the patient's problem, develop a management plan including generation of prescriptions and/or orders, and on subsequent communication with the patient through secure the MyChart portal service.   There is no separately reported E/M service related to this service in the past 7 days nor does the patient have an upcoming soonest available appointment for this issue. This work was completed in less than 7 days.   The patient consented to this service today (see patient agreement prior to ongoing communication). Patient counseled regarding the need for in-person exam  for certain conditions and was advised to call the office if any changing or worsening symptoms occur.   The codes to be used for the E/M service are: [x]   99421 for 5-10 minutes of time spent on the inquiry. []   [id number] for 11-20 minutes. []   [id number] for 21+ minutes.

## 2018-08-21 NOTE — Telephone Encounter (Signed)
Returned call and spoke to pt.  Pt stated that she has been taking Mannose since Sunday to try to clear up up bladder infection symptoms but it has not worked.  Pt requested antibiotics to be called in.  Per PCP, pt informed that she will need to give a urine sample before antibiotics are called in.  Pt said that she lives at [facility] and they are currently on lock down due to the corona virus outbreak.  Pt said residents are not allowed to leave facility nor can anyone enter in.    Scheduled pt for telephone visit today @ 4:00 pm.  Ok to use home number listed in [facility] for televisit.   Informed pt that notes will be sent to PCP.

## 2018-08-21 NOTE — Telephone Encounter (Signed)
Pt on lockdown cant leave [place] to bring in urine culture  [place]

## 2018-08-21 NOTE — Telephone Encounter (Signed)
Copied from [place] [phone]. Topic: General - Other >> Aug 21, 2018  2:18 PM Richardo Priest, [place] wrote: Reason for CRM:  Patient left a voicemail stating she is experiencing symptoms of a bladder infection and pain since Sunday, states she has cloudy urine. At this time patient is requesting an antibiotic.

## 2018-08-27 ENCOUNTER — Encounter: Payer: Medicare Other | Admitting: Physical Therapy

## 2018-09-02 ENCOUNTER — Other Ambulatory Visit: Payer: Self-pay

## 2018-09-02 ENCOUNTER — Ambulatory Visit (INDEPENDENT_AMBULATORY_CARE_PROVIDER_SITE_OTHER): Payer: Medicare Other | Admitting: Internal Medicine

## 2018-09-02 DIAGNOSIS — G4733 Obstructive sleep apnea (adult) (pediatric): Secondary | ICD-10-CM | POA: Diagnosis not present

## 2018-09-02 NOTE — Progress Notes (Signed)
[facility] Consultation     Virtual Visit via Telephone Note I connected with pt on 09/02/18 at  8:30 AM EDT by telephone and verified that I am speaking with the correct person using two identifiers.   I discussed the limitations, risks, security and privacy concerns of performing an evaluation and management service by telephone and the availability of in person appointments. I also discussed with the patient that there may be a patient responsible charge related to this service. The patient expressed understanding and agreed to proceed. I discussed the assessment and treatment plan with the patient. The patient was provided an opportunity to ask questions and all were answered. The patient agreed with the plan and demonstrated an understanding of the instructions. Please see note below for further detail.    The patient was advised to call back or seek an in-person evaluation if the symptoms worsen or if the condition fails to improve as anticipated.  I provided 11 minutes of non-face-to-face time during this encounter.   Laverle Hobby, MD    Assessment and Plan:  Obstructive sleep apnea. - History of obstructive sleep apnea diagnosed in 2014.  Now started on an use CPAP machine. - Continue CPAP with pressure range 5-20 nightly.  Essential hypertension. - Sleep apnea can contribute to above condition, therefore treatment of sleep apnea is important part of this management.     Date: 09/02/2018  MRN# [id number] Tonya Green 06-11-1950    Tonya Green is a 68 y.o. old female seen in consultation for chief complaint of:  Sleep apnea.    HPI:   The patient is a 68 year old female with a history of obstructive sleep apnea.  Review of old records shows that the patient had a previous sleep study which appears to have been a split-night in 2014, at that time she had mild obstructive sleep apnea with an AHI of 11.9, she was recommended to be on a CPAP level of  6. Since her last visit she has been started on a new CPAP with which she is doing much better.  She notes that her sleep is more restful, she wakes up in the morning feeling rested, she is no longer snoring.  She uses the CPAP all night every night.  **CPAP download 08/02/2018-08/31/2018>> usage greater than 4 hours is 30/30 days.  Average usage on days used is 6 hours 38 minutes.  Pressure ranges 5-20.  Median pressure 7, 95th percentile pressure #9, maximum pressure 11.  Leaks are within normal limits.  Residual AHI 0.8.  Overall this shows excellent compliance with excellent control of obstructive sleep apnea. **Home sleep test 12/12/2012, AHI of 8. **Outside sleep study 01/06/2013 mild obstructive sleep apnea with AHI of 11.9.  CPAP was titrated up to a level of 6.  CPAP at 6 was recommended.   Medication:    Current Outpatient Medications:  .  ALPHA LIPOIC ACID PO, Take 250 mg by mouth., Disp: , Rfl:  .  amLODipine (NORVASC) 2.5 MG tablet, Take 1 tablet (2.5 mg total) by mouth daily. In am if BP>140/?90 take another tablet, Disp: 180 tablet, Rfl: 3 .  Ascorbic Acid (VITAMIN C) 1000 MG tablet, Take 1,000 mg by mouth 3 (three) times daily., Disp: , Rfl:  .  aspirin EC 81 MG tablet, Take 81 mg by mouth daily., Disp: , Rfl:  .  Cholecalciferol (VITAMIN D3) 5000 units CAPS, Take by mouth., Disp: , Rfl:  .  ciprofloxacin (CIPRO) 250 MG tablet, Take 1  tablet (250 mg total) by mouth 2 (two) times daily. With food x 3-5 days stop once symptoms improved, Disp: 10 tablet, Rfl: 0 .  diazepam (VALIUM) 2 MG tablet, 1/2 pill to 1 pill qhs prn, Disp: 30 tablet, Rfl: 2 .  gabapentin (NEURONTIN) 100 MG capsule, Take 2 capsules (200 mg total) by mouth 3 (three) times daily., Disp: 510 capsule, Rfl: 3 .  HYDROcodone-acetaminophen (NORCO/VICODIN) 5-325 MG tablet, Take 1 tablet by mouth as needed (only for flare ups). , Disp: , Rfl:  .  levothyroxine (SYNTHROID, LEVOTHROID) 50 MCG tablet, Take 1 tablet (50 mcg total)  by mouth daily before breakfast., Disp: 90 tablet, Rfl: 3 .  MAGNESIUM MALATE PO, Take 625 mg by mouth., Disp: , Rfl:  .  Nattokinase 100 MG CAPS, Take 100 mg by mouth daily., Disp: , Rfl:  .  NON FORMULARY, Mannose 500 mg qid  nattokinase 100 mg qd, Disp: , Rfl:  .  phenazopyridine (PYRIDIUM) 95 MG tablet, Take 1 tablet (95 mg total) by mouth 3 (three) times daily as needed for pain., Disp: 10 tablet, Rfl: 0 .  Strontium Chloride POWD, Use as directed 500 mg in the mouth or throat. , Disp: , Rfl:    Allergies:  Macrobid [nitrofurantoin macrocrystal] and Sulfa antibiotics  Review of Systems:  Constitutional: Feels well. Cardiovascular: Denies chest pain, exertional chest pain.  Pulmonary: Denies hemoptysis, pleuritic chest pain.   The remainder of systems were reviewed and were found to be negative other than what is documented in the HPI.      Physical Examination:  Not done.     LABORATORY PANEL:   CBC No results for input(s): WBC, HGB, HCT, PLT in the last 168 hours. ------------------------------------------------------------------------------------------------------------------  Chemistries  No results for input(s): NA, K, CL, CO2, GLUCOSE, BUN, CREATININE, CALCIUM, MG, AST, ALT, ALKPHOS, BILITOT in the last 168 hours.  Invalid input(s): GFRCGP ------------------------------------------------------------------------------------------------------------------  Cardiac Enzymes No results for input(s): TROPONINI in the last 168 hours. ------------------------------------------------------------  RADIOLOGY:  No results found.     Thank  you for the consultation and for allowing [facility] to assist in the care of your patient. Our recommendations are noted above.  Please contact us if we can be of further service.   Marda Stalker, M.D., F.C.C.P.  Board Certified in Internal Medicine, Pulmonary Medicine, [place], and Sleep  Medicine.  [facility] Office Number: [phone]   09/02/2018

## 2018-09-03 ENCOUNTER — Encounter: Payer: Medicare Other | Admitting: Physical Therapy

## 2018-09-09 ENCOUNTER — Ambulatory Visit: Payer: Medicare Other | Admitting: Internal Medicine

## 2018-09-10 ENCOUNTER — Encounter: Payer: Medicare Other | Admitting: Physical Therapy

## 2018-09-10 ENCOUNTER — Other Ambulatory Visit: Payer: Self-pay | Admitting: Internal Medicine

## 2018-09-10 DIAGNOSIS — M62838 Other muscle spasm: Secondary | ICD-10-CM

## 2018-09-10 MED ORDER — DIAZEPAM 2 MG PO TABS: ORAL_TABLET | ORAL | 2 refills | Status: DC

## 2018-11-08 ENCOUNTER — Encounter: Payer: Self-pay | Admitting: Internal Medicine

## 2018-11-10 ENCOUNTER — Other Ambulatory Visit: Payer: Self-pay | Admitting: Internal Medicine

## 2018-11-10 DIAGNOSIS — M5416 Radiculopathy, lumbar region: Secondary | ICD-10-CM

## 2018-11-10 MED ORDER — GABAPENTIN 100 MG PO CAPS: 300.0000 mg | ORAL_CAPSULE | Freq: Three times a day (TID) | ORAL | 3 refills | Status: DC

## 2018-11-15 ENCOUNTER — Other Ambulatory Visit: Payer: Self-pay

## 2018-11-15 ENCOUNTER — Emergency Department: Payer: Medicare Other

## 2018-11-15 ENCOUNTER — Emergency Department
Admission: EM | Admit: 2018-11-15 | Discharge: 2018-11-15 | Disposition: A | Discharge status: Home / Self Care | Payer: Medicare Other | Attending: Emergency Medicine | Admitting: Emergency Medicine

## 2018-11-15 ENCOUNTER — Encounter: Payer: Self-pay | Admitting: Emergency Medicine

## 2018-11-15 DIAGNOSIS — Z79899 Other long term (current) drug therapy: Secondary | ICD-10-CM | POA: Insufficient documentation

## 2018-11-15 DIAGNOSIS — Y939 Activity, unspecified: Secondary | ICD-10-CM | POA: Diagnosis not present

## 2018-11-15 DIAGNOSIS — S82891A Other fracture of right lower leg, initial encounter for closed fracture: Secondary | ICD-10-CM | POA: Insufficient documentation

## 2018-11-15 DIAGNOSIS — Z7982 Long term (current) use of aspirin: Secondary | ICD-10-CM | POA: Diagnosis not present

## 2018-11-15 DIAGNOSIS — S8991XA Unspecified injury of right lower leg, initial encounter: Secondary | ICD-10-CM | POA: Diagnosis present

## 2018-11-15 DIAGNOSIS — Y929 Unspecified place or not applicable: Secondary | ICD-10-CM | POA: Diagnosis not present

## 2018-11-15 DIAGNOSIS — E039 Hypothyroidism, unspecified: Secondary | ICD-10-CM | POA: Insufficient documentation

## 2018-11-15 DIAGNOSIS — W010XXA Fall on same level from slipping, tripping and stumbling without subsequent striking against object, initial encounter: Secondary | ICD-10-CM | POA: Insufficient documentation

## 2018-11-15 DIAGNOSIS — I10 Essential (primary) hypertension: Secondary | ICD-10-CM | POA: Insufficient documentation

## 2018-11-15 DIAGNOSIS — Y999 Unspecified external cause status: Secondary | ICD-10-CM | POA: Diagnosis not present

## 2018-11-15 HISTORY — DX: Radiculopathy, site unspecified: M54.10

## 2018-11-15 IMAGING — CR RIGHT ANKLE - COMPLETE 3+ VIEW
1 series · 3 of 3 positions shown · non-contrast
Comparison: None.

CLINICAL DATA: Right ankle pain.  Fall yesterday.

EXAM:
RIGHT ANKLE - COMPLETE 3+ VIEW

[Series 1: x ankle ap right · 0.14mm/px · 3 of 3 slices shown]
[im 1/3]
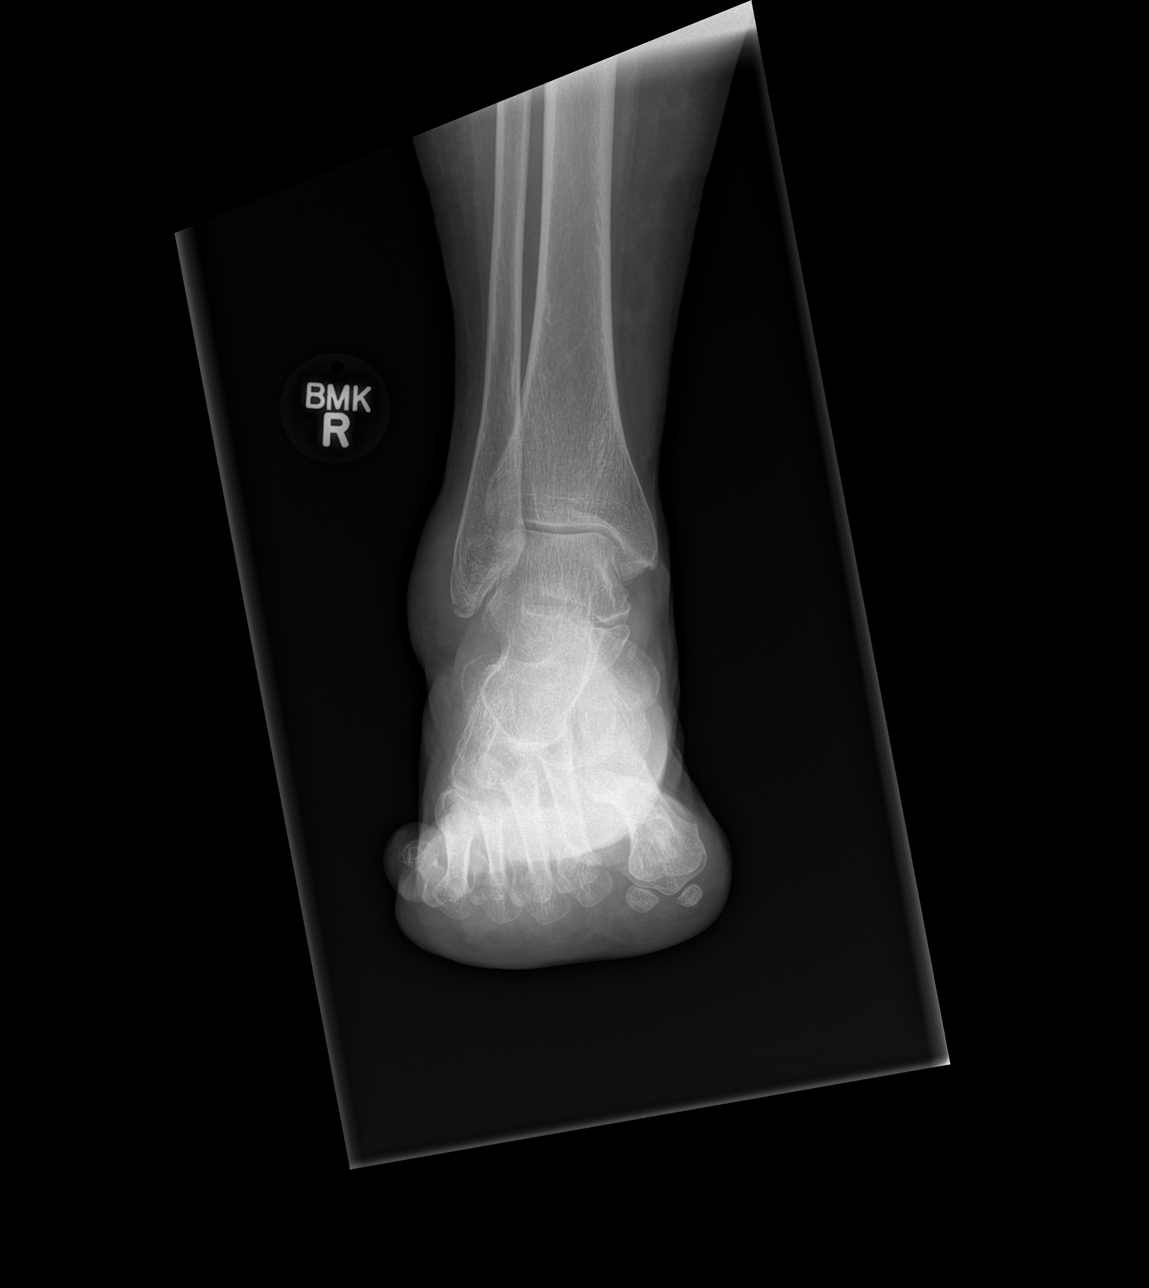
[im 2/3]
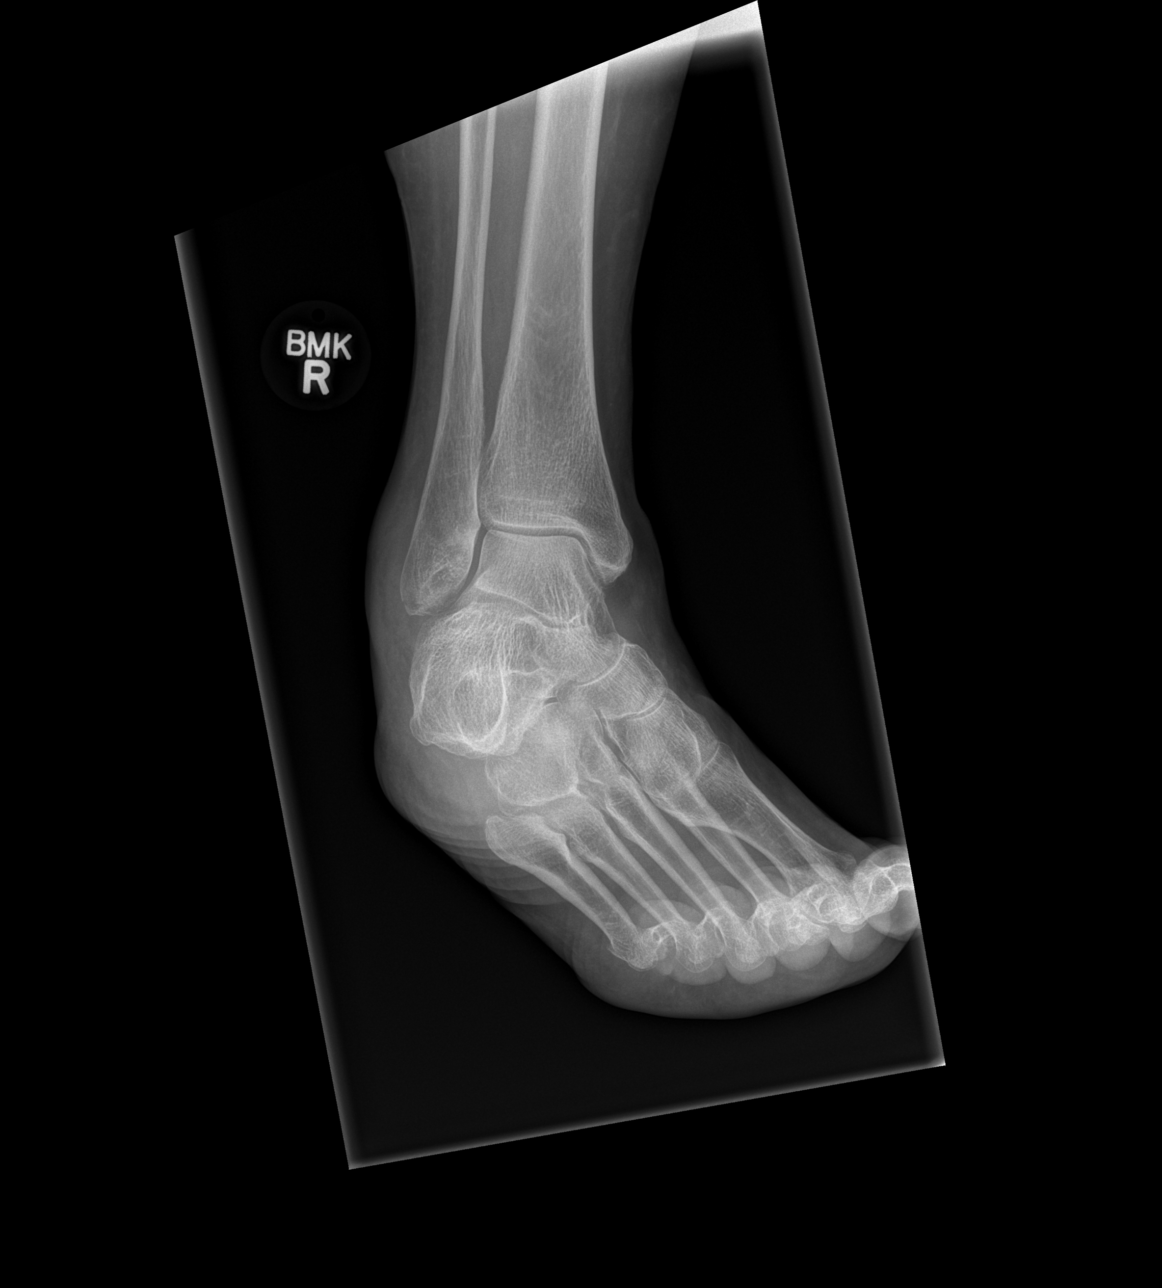
[im 3/3]
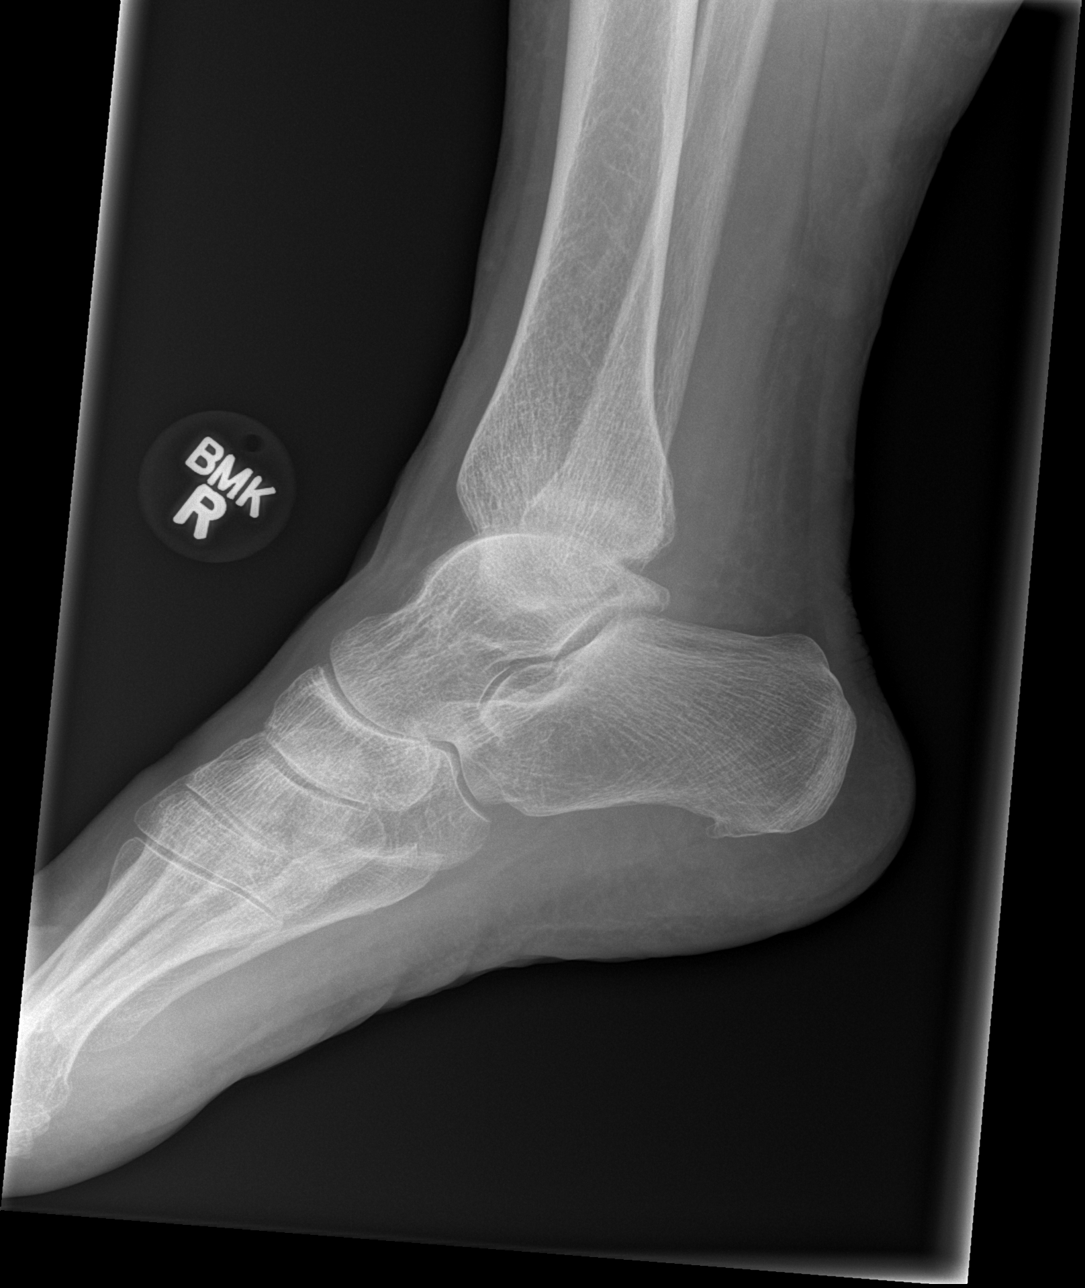

[3 of 3 positions shown; findings below may reference images not displayed]

FINDINGS: Nondisplaced fracture at the tip of the lateral malleolus with
overlying prominent lateral right ankle soft tissue swelling. No
additional fracture. No subluxation. No suspicious focal osseous
lesion. Small plantar right calcaneal spur. No radiopaque foreign
body.
IMPRESSION: Nondisplaced fracture at the tip of the lateral malleolus with
prominent lateral right ankle soft tissue swelling. No subluxation.

## 2018-11-15 MED ORDER — NAPROXEN 500 MG PO TABS: 500.0000 mg | ORAL_TABLET | Freq: Two times a day (BID) | ORAL | Status: DC

## 2018-11-15 NOTE — ED Provider Notes (Signed)
[facility] Emergency Department Provider Note   ____________________________________________   First MD Initiated Contact with Patient 11/15/18 [phone]     (approximate)  I have reviewed the triage vital signs and the nursing notes.   HISTORY  Chief Complaint Ankle Pain    HPI Tonya Green is a 68 y.o. female patient presents with right ankle pain secondary to fall yesterday.  Patient states ankle has swollen more since the incident.  Patient did pain increased with weightbearing.  Patient rates the pain as a 5/10.  Patient's chronic pain is "achy".  No palliative measures for complaint.         Past Medical History:  Diagnosis Date  . Carotid artery stenosis    mild L>R 03/26/16 [place]   . Diverticulosis    colonoscopy 10/13/16 [place]  . Factor 5 Leiden mutation, heterozygous ([place])   . History of blood transfusion    birth Rh factor   . Hyperlipidemia   . Hypertension   . OSA on CPAP   . Radiculopathy   . Right leg DVT ([place])    2010 s/p back surgery was on Coumadin x 6 months   . Thyroid disease    hypothyroidism     Patient Active Problem List   Diagnosis Date Noted  . UTI (urinary tract infection) 08/21/2018  . Chronic low back pain with sciatica 06/17/2018  . Lumbar radiculopathy 12/18/2017  . Hyponatremia 12/18/2017  . Leukocytosis 12/18/2017  . Muscle spasm 12/18/2017  . OSA on CPAP 11/01/2017  . History of skin cancer 11/01/2017  . Carotid artery disease ([place]) 11/01/2017  . HTN (hypertension) 11/01/2017  . HLD (hyperlipidemia) 11/01/2017  . Heterozygous factor V Leiden mutation ([place]) 11/01/2017  . Hypothyroidism 11/01/2017  . Osteopenia 11/01/2017  . Osteoporosis 11/01/2017    Past Surgical History:  Procedure Laterality Date  . APPENDECTOMY     2000  . BREAST SURGERY     biospy ? year   . ruptured disc repair     L4/5 01/6221 with complications residual right leg weakness/reduced sensation and DVT in 2010 post  surgery     Prior to Admission medications   Medication Sig Start Date End Date Taking? Authorizing Provider  ALPHA LIPOIC ACID PO Take 250 mg by mouth.    [provider]  amLODipine (NORVASC) 2.5 MG tablet Take 1 tablet (2.5 mg total) by mouth daily. In am if BP>140/?90 take another tablet 03/06/18   McLean-Scocuzza, Nino Glow, MD  Ascorbic Acid (VITAMIN C) 1000 MG tablet Take 1,000 mg by mouth 3 (three) times daily.    [provider]  aspirin EC 81 MG tablet Take 81 mg by mouth daily.    [provider]  Cholecalciferol (VITAMIN D3) 5000 units CAPS Take by mouth.    [provider]  ciprofloxacin (CIPRO) 250 MG tablet Take 1 tablet (250 mg total) by mouth 2 (two) times daily. With food x 3-5 days stop once symptoms improved 08/21/18   McLean-Scocuzza, Nino Glow, MD  diazepam (VALIUM) 2 MG tablet 1/2 pill to 1 pill qhs prn 09/10/18   McLean-Scocuzza, Nino Glow, MD  gabapentin (NEURONTIN) 100 MG capsule Take 3 capsules (300 mg total) by mouth 3 (three) times daily. 11/10/18   McLean-Scocuzza, Nino Glow, MD  HYDROcodone-acetaminophen (NORCO/VICODIN) 5-325 MG tablet Take 1 tablet by mouth as needed (only for flare ups).     [provider]  levothyroxine (SYNTHROID, LEVOTHROID) 50 MCG tablet Take 1 tablet (50 mcg total) by  mouth daily before breakfast. 06/11/18   McLean-Scocuzza, Nino Glow, MD  MAGNESIUM MALATE PO Take 625 mg by mouth.    [provider]  naproxen (NAPROSYN) 500 MG tablet Take 1 tablet (500 mg total) by mouth 2 (two) times daily with a meal. 11/15/18   Sable Feil, PA-C  Nattokinase 100 MG CAPS Take 100 mg by mouth daily.    [provider]  NON FORMULARY Mannose 500 mg qid  nattokinase 100 mg qd    [provider]  phenazopyridine (PYRIDIUM) 95 MG tablet Take 1 tablet (95 mg total) by mouth 3 (three) times daily as needed for pain. 12/10/17   Lavonia Drafts, MD  Strontium Chloride POWD Use as directed 500 mg in the  mouth or throat.     [provider]    Allergies Macrobid [nitrofurantoin macrocrystal] and Sulfa antibiotics  Family History  Problem Relation Age of Onset  . Hypertension Mother   . Stroke Mother        in 66s   . Heart disease Father   . Stroke Father   . Cancer Brother        esophageal cancer   . Stroke Paternal Grandfather   . Hyperlipidemia Other     Social History Social History   Tobacco Use  . Smoking status: Never Smoker  . Smokeless tobacco: Never Used  Substance Use Topics  . Alcohol use: Yes    Comment: occ  . Drug use: Never    Review of Systems Constitutional: No fever/chills Eyes: No visual changes. ENT: No sore throat. Cardiovascular: Denies chest pain. Respiratory: Denies shortness of breath. Gastrointestinal: No abdominal pain.  No nausea, no vomiting.  No diarrhea.  No constipation. Genitourinary: Negative for dysuria. Musculoskeletal: Right ankle pain. Skin: Negative for rash. Neurological: Negative for headaches, focal weakness or numbness. Endocrine:  Hyperlipidemia, hypertension, and hypothyroidism. Allergic/Immunilogical: MicroBid and sulfur antibiotics. ____________________________________________   PHYSICAL EXAM:  VITAL SIGNS: ED Triage Vitals  Enc Vitals Group     BP 11/15/18 0456 136/88     Pulse Rate 11/15/18 0456 78     Resp 11/15/18 0456 18     Temp 11/15/18 0456 98.5 F (36.9 C)     Temp src --      SpO2 11/15/18 0456 96 %     Weight 11/15/18 0457 170 lb (77.1 kg)     Height 11/15/18 0457 5\' 5"  (1.651 m)     Head Circumference --      Peak Flow --      Pain Score 11/15/18 0456 5     Pain Loc --      Pain Edu? --      Excl. in [place]? --     Constitutional: Alert and oriented. Well appearing and in no acute distress. Cardiovascular: Normal rate, regular rhythm. Grossly normal heart sounds.  Good peripheral circulation. Respiratory: Normal respiratory effort.  No retractions. Lungs CTAB. Musculoskeletal: No  obvious deformity to the right ankle.  Lateral ankle edema.  Moderate guarding palpation along the lateral malleolus.   Neurologic:  Normal speech and language. No gross focal neurologic deficits are appreciated. No gait instability. Skin:  Skin is warm, dry and intact. No rash noted. Psychiatric: Mood and affect are normal. Speech and behavior are normal.  ____________________________________________   LABS (all labs ordered are listed, but only abnormal results are displayed)  Labs Reviewed - No data to display ____________________________________________  EKG   ____________________________________________  RADIOLOGY  ED MD interpretation:  Official radiology report(s): Dg Ankle Complete Right  Result Date: 11/15/2018 CLINICAL DATA:  Right ankle pain.  Fall yesterday. EXAM: RIGHT ANKLE - COMPLETE 3+ VIEW COMPARISON:  None. FINDINGS: Nondisplaced fracture at the tip of the lateral malleolus with overlying prominent lateral right ankle soft tissue swelling. No additional fracture. No subluxation. No suspicious focal osseous lesion. Small plantar right calcaneal spur. No radiopaque foreign body. IMPRESSION: Nondisplaced fracture at the tip of the lateral malleolus with prominent lateral right ankle soft tissue swelling. No subluxation. Electronically Signed   By: Ilona Sorrel M.D.   On: 11/15/2018 05:54    ____________________________________________   PROCEDURES  Procedure(s) performed (including Critical Care):  Procedures   ____________________________________________   INITIAL IMPRESSION / ASSESSMENT AND PLAN / ED COURSE  As part of my medical decision making, I reviewed the following data within the [place]        Patient presents with pain and swelling to the right right lateral ankle secondary to fall.  Discussed x-ray findings with patient revealing nondisplaced fracture of the distal fibula.  Patient placed in a splint and given discharge  care instruction.  Patient advised follow orthopedics for definitive evaluation and treatment.     ____________________________________________   FINAL CLINICAL IMPRESSION(S) / ED DIAGNOSES  Final diagnoses:  Closed fracture of right ankle, initial encounter     ED Discharge Orders         Ordered    naproxen (NAPROSYN) 500 MG tablet  2 times daily with meals     11/15/18 0746           Note:  This document was prepared using Dragon voice recognition software and may include unintentional dictation errors.    Sable Feil, PA-C 11/15/18 2536    Nena Polio, MD 11/15/18 [phone]

## 2018-11-15 NOTE — ED Notes (Signed)
Patient transported to X-ray 

## 2018-11-15 NOTE — ED Triage Notes (Signed)
Patient states that she had a mechanical fall yesterday. Patient states that she has pain to her right ankle.

## 2018-11-15 NOTE — Discharge Instructions (Addendum)
Wear splint and ambulate with crutches and evaluation by orthopedics.

## 2018-11-17 ENCOUNTER — Encounter: Payer: Self-pay | Admitting: Internal Medicine

## 2018-11-18 ENCOUNTER — Other Ambulatory Visit: Payer: Self-pay | Admitting: Internal Medicine

## 2018-11-18 DIAGNOSIS — W19XXXD Unspecified fall, subsequent encounter: Secondary | ICD-10-CM

## 2018-11-18 DIAGNOSIS — M25571 Pain in right ankle and joints of right foot: Secondary | ICD-10-CM

## 2018-12-15 ENCOUNTER — Encounter: Payer: Self-pay | Admitting: Internal Medicine

## 2018-12-15 ENCOUNTER — Telehealth: Payer: Self-pay | Admitting: Internal Medicine

## 2018-12-15 NOTE — Telephone Encounter (Signed)
Paperwork placed in provider box °

## 2018-12-15 NOTE — Telephone Encounter (Signed)
Pt dropped off papers that she wants Dr. Olivia Mackie to see before her up coming appt. Envelope is up front in color folder.

## 2018-12-16 ENCOUNTER — Ambulatory Visit (INDEPENDENT_AMBULATORY_CARE_PROVIDER_SITE_OTHER): Payer: Medicare Other | Admitting: Internal Medicine

## 2018-12-16 ENCOUNTER — Encounter: Payer: Self-pay | Admitting: Internal Medicine

## 2018-12-16 ENCOUNTER — Other Ambulatory Visit: Payer: Self-pay

## 2018-12-16 DIAGNOSIS — I6523 Occlusion and stenosis of bilateral carotid arteries: Secondary | ICD-10-CM

## 2018-12-16 DIAGNOSIS — Z1231 Encounter for screening mammogram for malignant neoplasm of breast: Secondary | ICD-10-CM

## 2018-12-16 DIAGNOSIS — M810 Age-related osteoporosis without current pathological fracture: Secondary | ICD-10-CM

## 2018-12-16 DIAGNOSIS — M5440 Lumbago with sciatica, unspecified side: Secondary | ICD-10-CM

## 2018-12-16 DIAGNOSIS — I779 Disorder of arteries and arterioles, unspecified: Secondary | ICD-10-CM

## 2018-12-16 DIAGNOSIS — Z9989 Dependence on other enabling machines and devices: Secondary | ICD-10-CM

## 2018-12-16 DIAGNOSIS — G4733 Obstructive sleep apnea (adult) (pediatric): Secondary | ICD-10-CM

## 2018-12-16 DIAGNOSIS — S82891D Other fracture of right lower leg, subsequent encounter for closed fracture with routine healing: Secondary | ICD-10-CM

## 2018-12-16 DIAGNOSIS — I739 Peripheral vascular disease, unspecified: Secondary | ICD-10-CM

## 2018-12-16 DIAGNOSIS — I10 Essential (primary) hypertension: Secondary | ICD-10-CM

## 2018-12-16 DIAGNOSIS — M85859 Other specified disorders of bone density and structure, unspecified thigh: Secondary | ICD-10-CM

## 2018-12-16 DIAGNOSIS — E785 Hyperlipidemia, unspecified: Secondary | ICD-10-CM

## 2018-12-16 DIAGNOSIS — N3000 Acute cystitis without hematuria: Secondary | ICD-10-CM

## 2018-12-16 DIAGNOSIS — G8929 Other chronic pain: Secondary | ICD-10-CM

## 2018-12-16 DIAGNOSIS — M5416 Radiculopathy, lumbar region: Secondary | ICD-10-CM

## 2018-12-16 DIAGNOSIS — R739 Hyperglycemia, unspecified: Secondary | ICD-10-CM

## 2018-12-16 DIAGNOSIS — E039 Hypothyroidism, unspecified: Secondary | ICD-10-CM

## 2018-12-16 NOTE — Progress Notes (Signed)
Virtual Visit via Video Note  I connected with Tonya Green  on 12/16/18 at  9:00 AM EDT by a video enabled telemedicine application and verified that I am speaking with the correct person using two identifiers.  Location patient: home Location provider:work  Persons participating in the virtual visit: patient, provider  I discussed the limitations of evaluation and management by telemedicine and the availability of in person appointments. The patient expressed understanding and agreed to proceed.   HPI: 1. Most form mailed for me to fill out for pt  2. HTN reviewed BP home readings 120s-150s/70s-80s on norvasc 2.5 mg taking 1x per day advised if BP >130/>80 take an extra dose for the day  3. HLD and Carotid atherosclerosis declines statin or Repatha and reports she takes nattokinase for atherosclerosis last carotid US in 03/26/16 disc consider repeat in 3-5 years  4. Chronic pain in back currently not doing PT she had to increase gabapentin dose after shingrix 2nd shot but now back to 200 mg tid  5. OSA using cpap pulm gave her a new CPAP 6. 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 H/o recurrent UTI no sx's currently  8. H/o ankle fracture no currently doing pt for this saw podiatry 1 week ago which rec she increase wt bearing she takes advil pm which helps 2 pills otc and ice improving  11/15/18 right ankle  Nondisplaced fracture at the tip of the lateral malleolus with prominent lateral right ankle soft tissue swelling. No subluxation.  ROS: See pertinent positives and negatives per HPI.  Past Medical History:  Diagnosis Date  . Carotid artery stenosis    mild L>R 03/26/16 [place]   . Diverticulosis    colonoscopy 10/13/16 [place]  . Factor 5 Leiden mutation, heterozygous ([place])   . History of blood transfusion    birth Rh factor   . Hyperlipidemia   . Hypertension   . OSA on CPAP   .  Radiculopathy   . Right leg DVT ([place])    2010 s/p back surgery was on Coumadin x 6 months   . Thyroid disease    hypothyroidism     Past Surgical History:  Procedure Laterality Date  . APPENDECTOMY     2000  . BREAST SURGERY     biospy ? year   . ruptured disc repair     L4/5 06/6331 with complications residual right leg weakness/reduced sensation and DVT in 2010 post surgery     Family History  Problem Relation Age of Onset  . Hypertension Mother   . Stroke Mother        in 38s   . Heart disease Father   . Stroke Father   . Cancer Brother        esophageal cancer   . Stroke Paternal Grandfather   . Hyperlipidemia Other     SOCIAL HX: married    Current Outpatient Medications:  .  ALPHA LIPOIC ACID PO, Take 250 mg by mouth., Disp: , Rfl:  .  amLODipine (NORVASC) 2.5 MG tablet, Take 1 tablet (2.5 mg total) by mouth daily. In am if BP>140/?90 take another tablet, Disp: 180 tablet, Rfl: 3 .  Ascorbic Acid (VITAMIN C) 1000 MG tablet, Take 1,000 mg by mouth 3 (three) times daily., Disp: , Rfl:  .  aspirin EC 81 MG tablet, Take 81 mg by mouth daily., Disp: , Rfl:  .  Cholecalciferol (VITAMIN D3)  5000 units CAPS, Take by mouth., Disp: , Rfl:  .  ciprofloxacin (CIPRO) 250 MG tablet, Take 1 tablet (250 mg total) by mouth 2 (two) times daily. With food x 3-5 days stop once symptoms improved, Disp: 10 tablet, Rfl: 0 .  diazepam (VALIUM) 2 MG tablet, 1/2 pill to 1 pill qhs prn, Disp: 30 tablet, Rfl: 2 .  gabapentin (NEURONTIN) 100 MG capsule, Take 3 capsules (300 mg total) by mouth 3 (three) times daily. (Patient taking differently: Take 200 mg by mouth 3 (three) times daily. ), Disp: 510 capsule, Rfl: 3 .  levothyroxine (SYNTHROID, LEVOTHROID) 50 MCG tablet, Take 1 tablet (50 mcg total) by mouth daily before breakfast., Disp: 90 tablet, Rfl: 3 .  MAGNESIUM MALATE PO, Take 625 mg by mouth., Disp: , Rfl:  .  naproxen (NAPROSYN) 500 MG tablet, Take 1 tablet (500 mg total) by mouth 2  (two) times daily with a meal., Disp: 20 tablet, Rfl: 00 .  Nattokinase 100 MG CAPS, Take 100 mg by mouth daily., Disp: , Rfl:  .  NON FORMULARY, Mannose 500 mg qid  nattokinase 100 mg qd, Disp: , Rfl:  .  phenazopyridine (PYRIDIUM) 95 MG tablet, Take 1 tablet (95 mg total) by mouth 3 (three) times daily as needed for pain., Disp: 10 tablet, Rfl: 0 .  Strontium Chloride POWD, Use as directed 500 mg in the mouth or throat. , Disp: , Rfl:  .  HYDROcodone-acetaminophen (NORCO/VICODIN) 5-325 MG tablet, Take 1 tablet by mouth as needed (only for flare ups). , Disp: , Rfl:   EXAM:  VITALS per patient if applicable:  GENERAL: alert, oriented, appears well and in no acute distress  HEENT: atraumatic, conjunttiva clear, no obvious abnormalities on inspection of external nose and ears  NECK: normal movements of the head and neck  LUNGS: on inspection no signs of respiratory distress, breathing rate appears normal, no obvious gross SOB, gasping or wheezing  CV: no obvious cyanosis  MS: moves all visible extremities without noticeable abnormality  PSYCH/NEURO: pleasant and cooperative, no obvious depression or anxiety, speech and thought processing grossly intact  ASSESSMENT AND PLAN:  Discussed the following assessment and plan:  Essential hypertension with CAS and HLD - Plan: cont norvasc 2.5 mg qd and another dose if BP >130/>80 continue to log BP Declines statin/repatha and only wants to take nattokinase  OSA on CPAP - Plan: f/u pulm and continue cpap  Hypothyroidism, unspecified type - Plan: cont meds  Lumbar radiculopathy -  Chronic low back pain with sciatica, sciatica laterality unspecified, unspecified back pain laterality - Plan: -f/u PT continue chronic meds   Closed fracture of right ankle with routine healing, subsequent encounter - Plan: f/u podiatry and PT  Acute cystitis without hematuria - Plan: no sx's currently   HM Declines flu vaccine, prevnar, pna 23  vaccine Had zostervax 2012 shingrix given Rx shingrix had 1/2 2nd due by 12/29/2018 per pt had 2nd dose 10/06/18  Tdhad 06/29/15 declines Tdap for now  Out of age window pap h/o abnormal pap in 20s but normal since then   Colonoscopy had 10/13/16 diverticulosis reviewed letter will need report in future  -Colonoscopy 10/13/16 diverticulosis [facility] Dr. Cathlean Cower fax # [phone]  Mammogram had 06/20/17 normal reviewed letter will need report in future -Mammogram 06/20/17 normal [place] fax # [phone] -today wants to wait until 06/21/19 for next mammo ordered for upcoming  DEXA1/2019(h/o osteopenia hip and osteoporosis spine) get  records prior PCP  -per pt this is why taking Strontium5000 mg qdand had helped with osteoporosisimproved to osteopenia over 3 years -re ordered bone density 05/2019   Dr. Santiago Glad tbse h/o BCC forehead and SCC nose needs tbseappt sch 9/9/19no bxs normal exam appt upcoming 2021   eye exam in the future h/o flashes of light left eye resolved but negative retina issues/detachment per pt will see eye exam yearly Never smoker  Life line screening 03/26/16 left mild CAS No Afib  Nl ABI  TC 298, HDL 52, LDL 212, TG 170 declines statin   Filled out MOST form today    I discussed the assessment and treatment plan with the patient. The patient was provided an opportunity to ask questions and all were answered. The patient agreed with the plan and demonstrated an understanding of the instructions.   The patient was advised to call back or seek an in-person evaluation if the symptoms worsen or if the condition fails to improve as anticipated.  Time spent 25 minutes Delorise Jackson, MD

## 2018-12-19 ENCOUNTER — Encounter: Payer: Self-pay | Admitting: Internal Medicine

## 2018-12-24 ENCOUNTER — Encounter: Payer: Self-pay | Admitting: Internal Medicine

## 2019-01-05 ENCOUNTER — Encounter: Payer: Self-pay | Admitting: Internal Medicine

## 2019-01-05 ENCOUNTER — Telehealth: Payer: Self-pay | Admitting: Internal Medicine

## 2019-01-05 NOTE — Telephone Encounter (Signed)
Tomorrow it will be 3 weeks since our virtual appointment. Also, tomorrow I have volunteered to virtually join a program sponsored by [employer] designed to assist in training healthcare students in "Crucial Conversations" at the end of life. I would appreciate having in my possession a completed "MOST" form for me. Your office was provided my preferences prior to our virtual appointment on July 21st. I can only imagine how crazy busy you are and I apologize for making this request but would find it helpful to have a completed form before the next "Crucial Conversations" gathering takes place. Should you have any questions don't hesitate to contact me. [phone].  Thank you, thank you, thank you.  Tonya Green   This should have been mailed I completed this for mail over 1 week ago for you and your husband   MTS

## 2019-01-06 NOTE — Telephone Encounter (Signed)
Yes they have been mailed.

## 2019-01-24 ENCOUNTER — Encounter: Payer: Self-pay | Admitting: Internal Medicine

## 2019-01-28 ENCOUNTER — Ambulatory Visit
Admission: RE | Admit: 2019-01-28 | Discharge: 2019-01-28 | Disposition: A | Discharge status: Home / Self Care | Payer: Medicare Other | Source: Ambulatory Visit | Attending: Internal Medicine | Admitting: Internal Medicine

## 2019-01-28 DIAGNOSIS — Z1231 Encounter for screening mammogram for malignant neoplasm of breast: Secondary | ICD-10-CM | POA: Diagnosis not present

## 2019-01-28 IMAGING — MG MM DIGITAL SCREENING BILAT W/ TOMO W/ CAD
8 series · 8 of 24 positions shown · non-contrast
Comparison: Previous exam(s).

CLINICAL DATA: Screening.

EXAM:
DIGITAL SCREENING BILATERAL MAMMOGRAM WITH TOMO AND CAD

[R MLO synth-2D]
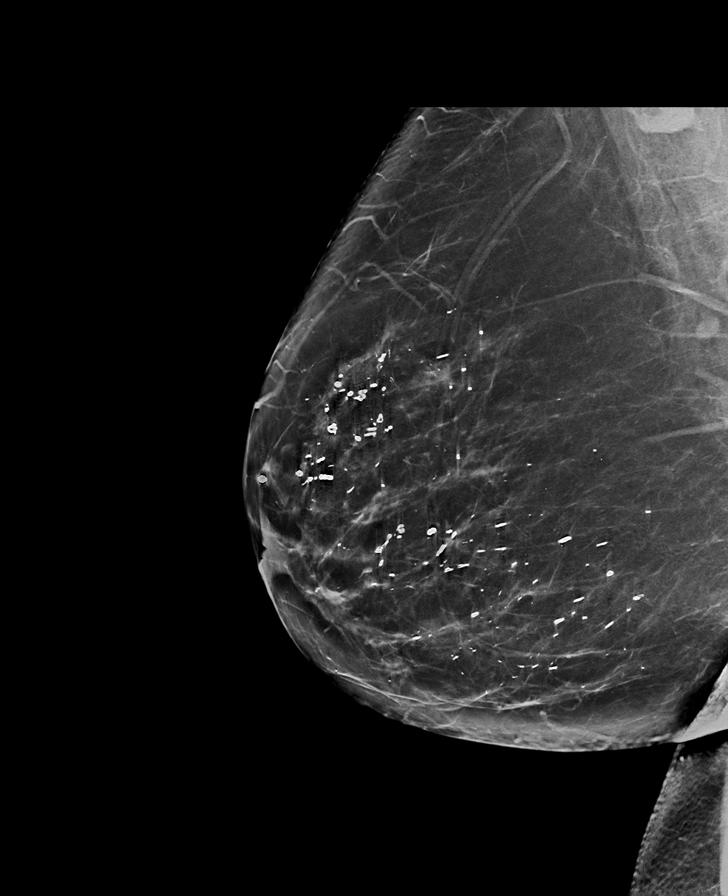

[L CC synth-2D]
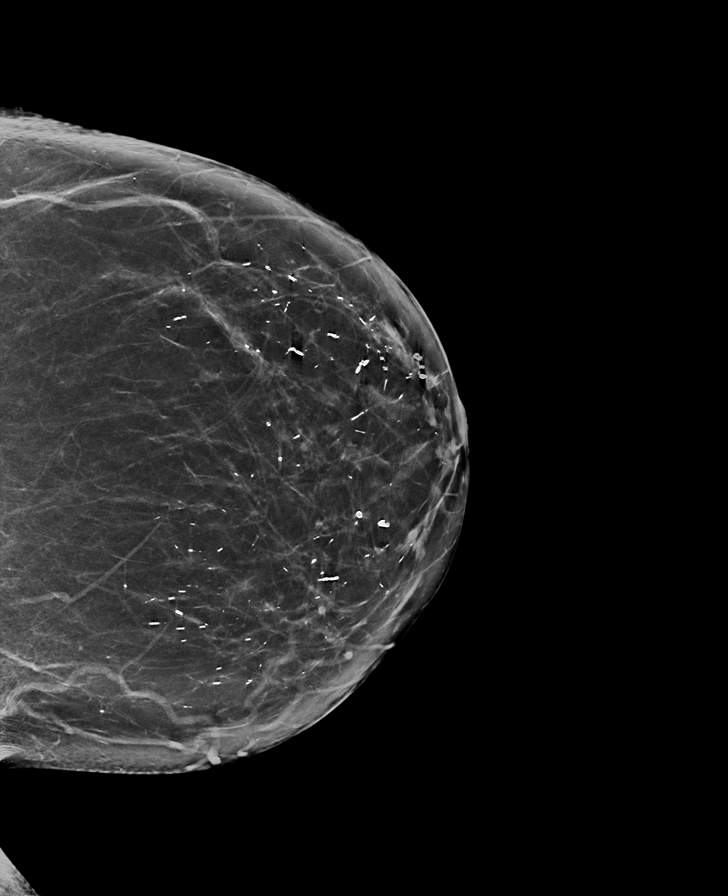

[L MLO synth-2D]
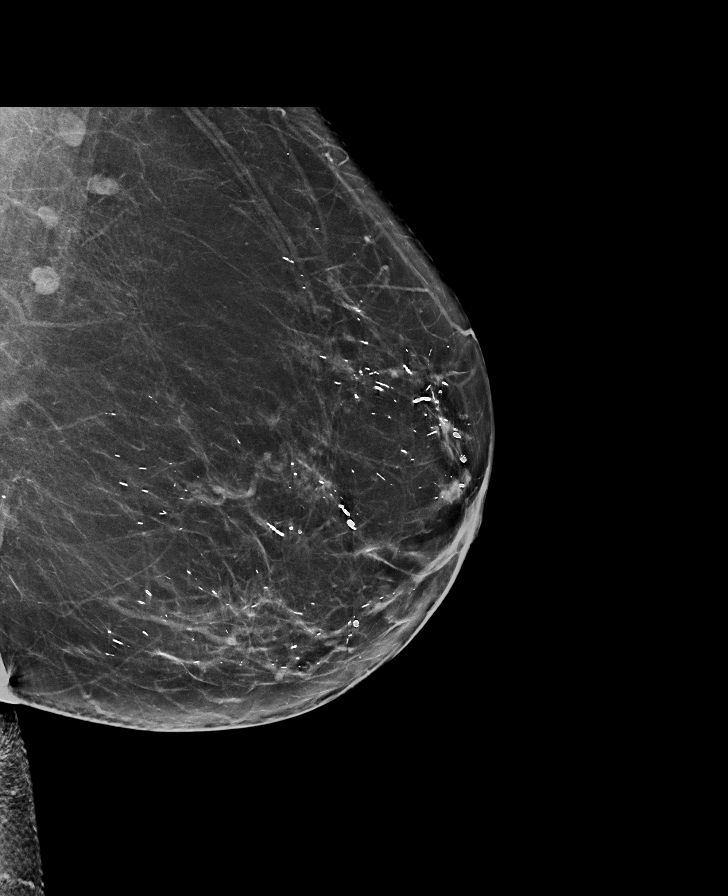

[R CC synth-2D]
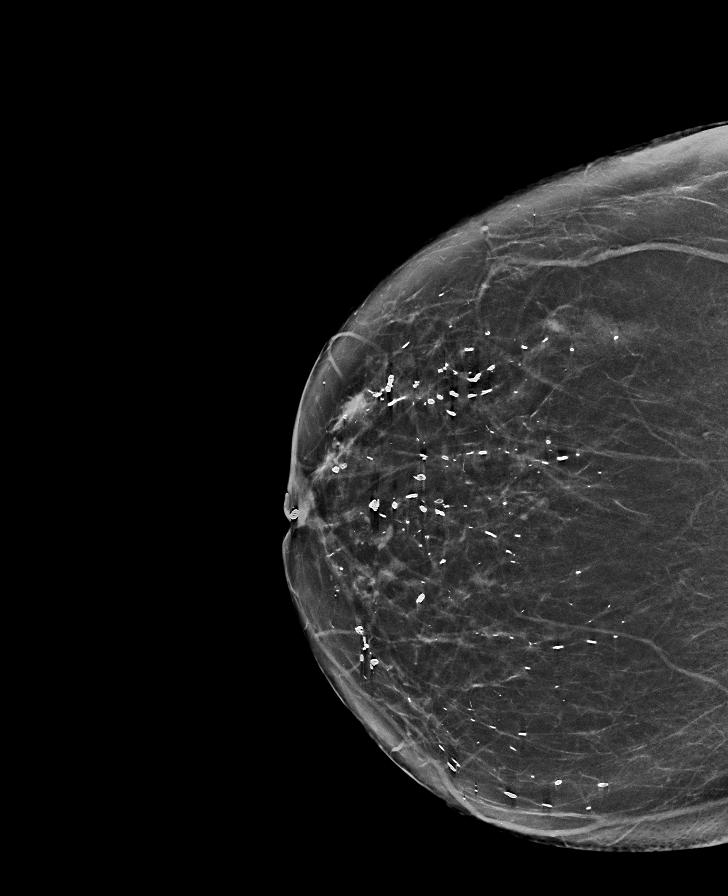

[L CC tomo · tomo slice 39/76.0]
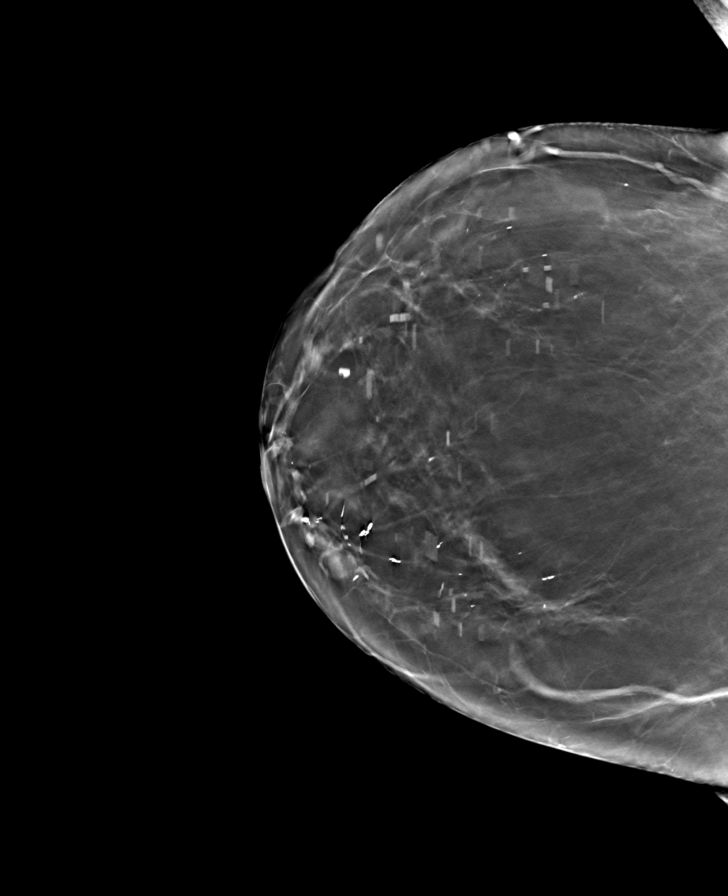

[R CC tomo · tomo slice 36/71.0]
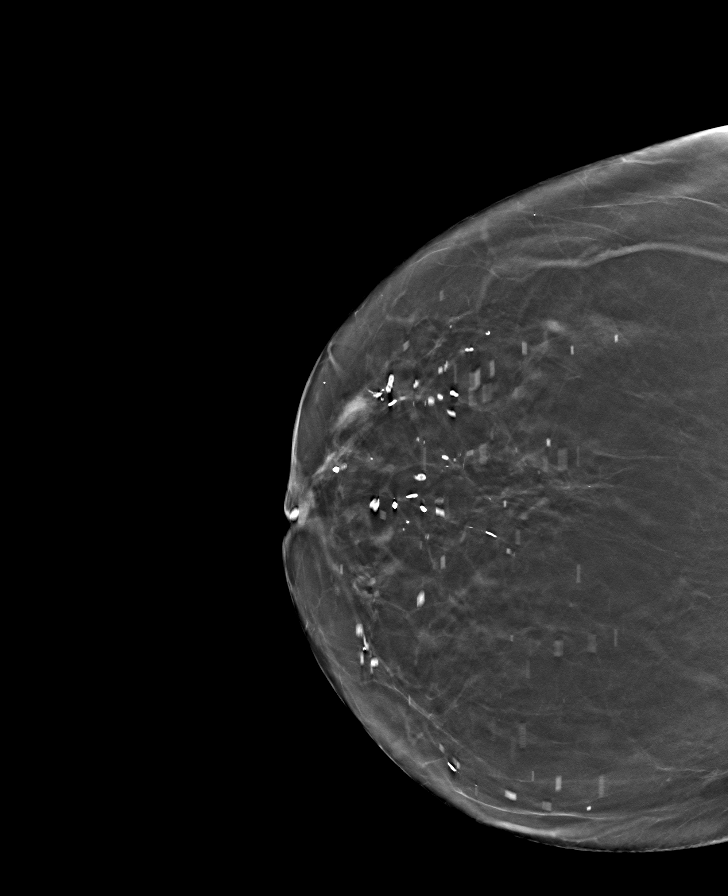

[R MLO tomo · tomo slice 43/84.0]
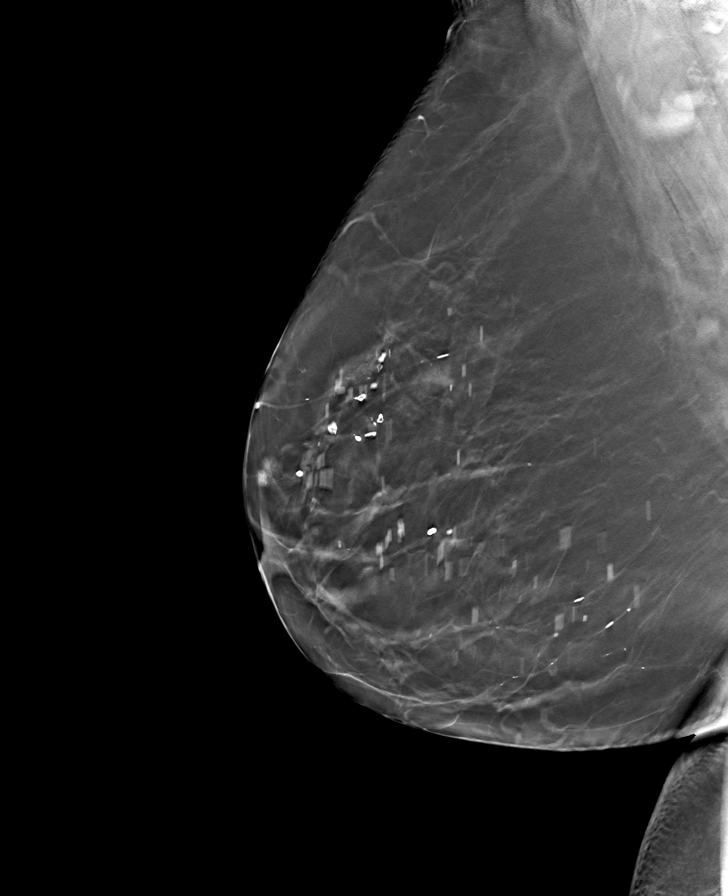

[L MLO tomo · tomo slice 39/78.0]
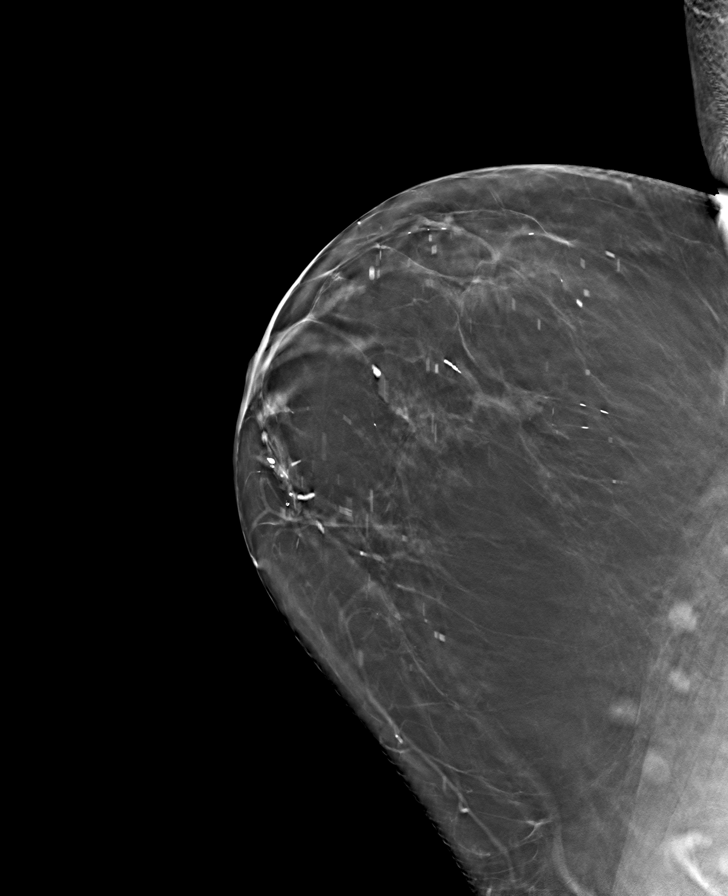

[8 of 24 positions shown; findings below may reference images not displayed]

ACR Breast Density Category b: There are scattered areas of
fibroglandular density.
FINDINGS: There are no findings suspicious for malignancy. Images were
processed with CAD.
IMPRESSION: No mammographic evidence of malignancy. A result letter of this
screening mammogram will be mailed directly to the patient.

RECOMMENDATION:
Screening mammogram in one year. (Code:[TQ])

BI-RADS CATEGORY  1: Negative.

## 2019-02-04 ENCOUNTER — Encounter: Payer: Self-pay | Admitting: Internal Medicine

## 2019-02-20 ENCOUNTER — Other Ambulatory Visit: Payer: Self-pay | Admitting: Internal Medicine

## 2019-02-20 DIAGNOSIS — G8929 Other chronic pain: Secondary | ICD-10-CM

## 2019-02-20 MED ORDER — HYDROCODONE-ACETAMINOPHEN 5-325 MG PO TABS: 1.0000 | ORAL_TABLET | Freq: Two times a day (BID) | ORAL | 0 refills | Status: DC | PRN

## 2019-03-03 ENCOUNTER — Other Ambulatory Visit: Payer: Self-pay | Admitting: Internal Medicine

## 2019-03-03 DIAGNOSIS — M5416 Radiculopathy, lumbar region: Secondary | ICD-10-CM

## 2019-03-03 MED ORDER — GABAPENTIN 100 MG PO CAPS: 300.0000 mg | ORAL_CAPSULE | Freq: Three times a day (TID) | ORAL | 3 refills | Status: DC

## 2019-03-03 MED ORDER — GABAPENTIN 100 MG PO CAPS: 300.0000 mg | ORAL_CAPSULE | Freq: Three times a day (TID) | ORAL | 5 refills | Status: DC

## 2019-03-03 NOTE — Telephone Encounter (Signed)
Copied from [place] [id number]. Topic: Quick Communication - Rx Refill/Question >> Mar 03, 2019  9:40 AM Yvette Rack wrote: Medication: gabapentin (NEURONTIN) 100 MG capsule  Has the patient contacted their pharmacy? yes   Preferred Pharmacy (with phone number or street name): [facility] [id number], [place] [phone] (Phone)  [phone] (Fax)  Agent: Please be advised that RX refills may take up to 3 business days. We ask that you follow-up with your pharmacy.

## 2019-03-10 ENCOUNTER — Encounter: Payer: Self-pay | Admitting: Internal Medicine

## 2019-03-10 ENCOUNTER — Other Ambulatory Visit: Payer: Self-pay | Admitting: Internal Medicine

## 2019-03-10 DIAGNOSIS — M5416 Radiculopathy, lumbar region: Secondary | ICD-10-CM

## 2019-03-10 DIAGNOSIS — M5440 Lumbago with sciatica, unspecified side: Secondary | ICD-10-CM

## 2019-03-10 DIAGNOSIS — I10 Essential (primary) hypertension: Secondary | ICD-10-CM

## 2019-03-10 DIAGNOSIS — G8929 Other chronic pain: Secondary | ICD-10-CM

## 2019-03-10 MED ORDER — GABAPENTIN 100 MG PO CAPS: 300.0000 mg | ORAL_CAPSULE | Freq: Three times a day (TID) | ORAL | 5 refills | Status: DC

## 2019-03-10 MED ORDER — AMLODIPINE BESYLATE 2.5 MG PO TABS: 2.5000 mg | ORAL_TABLET | Freq: Every day | ORAL | 3 refills | Status: DC

## 2019-05-11 ENCOUNTER — Encounter: Payer: Self-pay | Admitting: Infectious Diseases

## 2019-05-11 ENCOUNTER — Encounter: Payer: Self-pay | Admitting: Internal Medicine

## 2019-05-11 HISTORY — PX: MOHS SURGERY: SUR867

## 2019-06-03 ENCOUNTER — Encounter: Payer: Self-pay | Admitting: Internal Medicine

## 2019-06-19 ENCOUNTER — Ambulatory Visit (INDEPENDENT_AMBULATORY_CARE_PROVIDER_SITE_OTHER): Payer: Medicare Other | Admitting: Internal Medicine

## 2019-06-19 ENCOUNTER — Ambulatory Visit: Payer: Medicare Other

## 2019-06-19 ENCOUNTER — Other Ambulatory Visit: Payer: Self-pay

## 2019-06-19 VITALS — Ht 65.0 in | Wt 167.0 lb

## 2019-06-19 DIAGNOSIS — E039 Hypothyroidism, unspecified: Secondary | ICD-10-CM | POA: Diagnosis not present

## 2019-06-19 DIAGNOSIS — N3000 Acute cystitis without hematuria: Secondary | ICD-10-CM

## 2019-06-19 DIAGNOSIS — I10 Essential (primary) hypertension: Secondary | ICD-10-CM | POA: Diagnosis not present

## 2019-06-19 DIAGNOSIS — Z0000 Encounter for general adult medical examination without abnormal findings: Secondary | ICD-10-CM

## 2019-06-19 DIAGNOSIS — D72829 Elevated white blood cell count, unspecified: Secondary | ICD-10-CM

## 2019-06-19 DIAGNOSIS — E785 Hyperlipidemia, unspecified: Secondary | ICD-10-CM

## 2019-06-19 MED ORDER — LEVOTHYROXINE SODIUM 50 MCG PO TABS: 50.0000 ug | ORAL_TABLET | Freq: Every day | ORAL | 3 refills | Status: DC

## 2019-06-19 NOTE — Patient Instructions (Addendum)
  Tonya Green , Thank you for taking time to come for your Medicare Wellness Visit. I appreciate your ongoing commitment to your health goals. Please review the following plan we discussed and let me know if I can assist you in the future.   These are the goals we discussed: Goals      Patient Stated   . Increase physical activity (pt-stated)     Resume walking regimen       This is a list of the screening recommended for you and due dates:  Health Maintenance  Topic Date Due  . DEXA scan (bone density measurement)  01/28/2015  . Pneumonia vaccines (1 of 2 - PCV13) 06/18/2020*  . Mammogram  01/27/2021  . Tetanus Vaccine  06/28/2025  . Colon Cancer Screening  10/14/2026  . Flu Shot  Completed  .  Hepatitis C: One time screening is recommended by Center for Disease Control  (CDC) for  adults born from [birth year] through 1966.   Completed  *Topic was postponed. The date shown is not the original due date.

## 2019-06-19 NOTE — Progress Notes (Addendum)
Subjective:   Tonya Green is a 69 y.o. female who presents for an Initial Medicare Annual Wellness Visit.  Review of Systems    No ROS.  Medicare Wellness Virtual Visit.  Visual/audio telehealth visit, UTA vital signs.   Ht/Wt provided.  See social history for additional risk factors.  Cardiac Risk Factors include: advanced age (>8men, >25 women);hypertension     Objective:    Today's Vitals   06/19/19 1304  Weight: 167 lb (75.8 kg)  Height: 5\' 5"  (1.651 m)   Body mass index is 27.79 kg/m.  Advanced Directives 06/19/2019 11/15/2018 07/23/2018 06/17/2018  Does Patient Have a Medical Advance Directive? Yes Yes Yes Yes  Type of Advance Directive Out of facility DNR (pink MOST or yellow form) - - Press photographer;Living will  Does patient want to make changes to medical advance directive? No - Patient declined - - No - Patient declined  Copy of [place] in Chart? Yes - validated most recent copy scanned in chart (See row information) - - Yes - validated most recent copy scanned in chart (See row information)    Current Medications (verified) Outpatient Encounter Medications as of 06/19/2019  Medication Sig  . ALPHA LIPOIC ACID PO Take 250 mg by mouth.  Marland Kitchen amLODipine (NORVASC) 2.5 MG tablet Take 1 tablet (2.5 mg total) by mouth daily. In am if BP>140/?90 take another tablet  . Ascorbic Acid (VITAMIN C) 1000 MG tablet Take 1,000 mg by mouth 3 (three) times daily.  Marland Kitchen aspirin EC 81 MG tablet Take 81 mg by mouth daily.  . Cholecalciferol (VITAMIN D3) 5000 units CAPS Take by mouth.  . diazepam (VALIUM) 2 MG tablet 1/2 pill to 1 pill qhs prn  . gabapentin (NEURONTIN) 100 MG capsule Take 3 capsules (300 mg total) by mouth 3 (three) times daily.  Marland Kitchen HYDROcodone-acetaminophen (NORCO/VICODIN) 5-325 MG tablet Take 1 tablet by mouth 2 (two) times daily as needed (only for flare ups).  Marland Kitchen levothyroxine (SYNTHROID) 50 MCG tablet Take 1 tablet (50 mcg total) by mouth  daily before breakfast.  . MAGNESIUM MALATE PO Take 625 mg by mouth.  . naproxen (NAPROSYN) 500 MG tablet Take 1 tablet (500 mg total) by mouth 2 (two) times daily with a meal.  . Nattokinase 100 MG CAPS Take 100 mg by mouth daily.  . NON FORMULARY Mannose 500 mg qid  nattokinase 100 mg qd  . Strontium Chloride POWD Use as directed 500 mg in the mouth or throat.   Marland Kitchen UNABLE TO FIND Med Name: Omega Q plus Max calamarine   No facility-administered encounter medications on file as of 06/19/2019.    Allergies (verified) Macrobid [nitrofurantoin macrocrystal] and Sulfa antibiotics   History: Past Medical History:  Diagnosis Date  . Carotid artery stenosis    mild L>R 03/26/16 [place]   . Diverticulosis    colonoscopy 10/13/16 [place]  . Factor 5 Leiden mutation, heterozygous ([place])   . History of blood transfusion    birth Rh factor   . Hyperlipidemia   . Hypertension   . OSA on CPAP   . Radiculopathy   . Right leg DVT ([place])    2010 s/p back surgery was on Coumadin x 6 months   . Thyroid disease    hypothyroidism    Past Surgical History:  Procedure Laterality Date  . APPENDECTOMY     2000  . BREAST EXCISIONAL BIOPSY Left 2005   for infection  . BREAST SURGERY  biospy ? year   . ruptured disc repair     L4/5 [id number] with complications residual right leg weakness/reduced sensation and DVT in 2010 post surgery    Family History  Problem Relation Age of Onset  . Hypertension Mother   . Stroke Mother        in 90s   . Heart disease Father   . Stroke Father   . Cancer Brother        esophageal cancer   . Stroke Paternal Grandfather   . Hyperlipidemia Other    Social History   Socioeconomic History  . Marital status: Married    Spouse name: Not on file  . Number of children: Not on file  . Years of education: Not on file  . Highest education level: Not on file  Occupational History  . Not on file  Tobacco Use  . Smoking status: Never Smoker  . Smokeless  tobacco: Never Used  Substance and Sexual Activity  . Alcohol use: Yes    Comment: occ  . Drug use: Never  . Sexual activity: Not on file  Other Topics Concern  . Not on file  Social History Narrative   BSN CCM, RN   Moved from [place] recently    2 daughters    Retired and married    No guns, wears seat belt, safe in relationship    Lives at [facility] Determinants of Health   Financial Resource Strain: [place]   . Difficulty of Paying Living Expenses: Not hard at all  Food Insecurity: No Food Insecurity  . Worried About Charity fundraiser in the Last Year: Never true  . Ran Out of Food in the Last Year: Never true  Transportation Needs: No Transportation Needs  . Lack of Transportation (Medical): No  . Lack of Transportation (Non-Medical): No  Physical Activity:   . Days of Exercise per Week: Not on file  . Minutes of Exercise per Session: Not on file  Stress: No Stress Concern Present  . Feeling of Stress : Not at all  Social Connections: Unknown  . Frequency of Communication with Friends and Family: More than three times a week  . Frequency of Social Gatherings with Friends and Family: Not on file  . Attends Religious Services: Not on file  . Active Member of Clubs or Organizations: Not on file  . Attends Archivist Meetings: Not on file  . Marital Status: Married    Tobacco Counseling Counseling given: Not Answered   Clinical Intake:  Pre-visit preparation completed: Yes        Diabetes: No  How often do you need to have someone help you when you read instructions, pamphlets, or other written materials from your doctor or pharmacy?: 1 - Never  Interpreter Needed?: No      Activities of Daily Living In your present state of health, do you have any difficulty performing the following activities: 06/19/2019  Hearing? N  Vision? N  Difficulty concentrating or making decisions? N  Walking or climbing stairs? Y  Comment  Recovering from a broken ankle, paces herself.  Dressing or bathing? N  Doing errands, shopping? N  Preparing Food and eating ? N  Using the Toilet? N  In the past six months, have you accidently leaked urine? N  Do you have problems with loss of bowel control? N  Managing your Medications? N  Managing your Finances? N  Housekeeping or managing your Housekeeping?  N  Some recent data might be hidden     Immunizations and Health Maintenance Immunization History  Administered Date(s) Administered  . Influenza, High Dose Seasonal PF 03/18/2018, 02/01/2019  . Tdap 06/29/2015  . Zoster Recombinat (Shingrix) 06/30/2018, 10/06/2018, 11/06/2018   Health Maintenance Due  Topic Date Due  . DEXA SCAN  01/28/2015    Patient Care Team: McLean-Scocuzza, Nino Glow, MD as PCP - General (Internal Medicine)  Indicate any recent Medical Services you may have received from other than [facility] providers in the past year (date may be approximate).     Assessment:   This is a routine wellness examination for Latria.  Nurse connected with patient 06/19/19 at  1:00 PM EST by a telephone enabled telemedicine application and verified that I am speaking with the correct person using two identifiers. Patient stated full name and DOB. Patient gave permission to continue with virtual visit. Patient's location was at home and Nurse's location was at [place] office.   Patient is alert and oriented x3. Patient denies difficulty focusing or concentrating. Patient likes to read and is a Probation officer for brain stimulation.   Health Maintenance Due: -Dexa Scan- scheduled 06/22/19 See completed HM at the end of note.   Eye: Visual acuity not assessed. Virtual visit. Followed by their ophthalmologist.  Dental: UTD    Hearing: Demonstrates normal hearing during visit.  Safety:  Patient feels safe at home- yes Patient does have smoke detectors at home- yes Patient does wear sunscreen or protective clothing when in  direct sunlight - yes Patient does wear seat belt when in a moving vehicle - yes Patient drives- yes Adequate lighting in walkways free from debris- yes Grab bars and handrails used as appropriate- yes Ambulates with an assistive device- no Life alert Cell phone on person when ambulating outside of the home- yes  Social: Alcohol intake - yes, occasional  Smoking history- never   Smokers in home? none Illicit drug use? none  Medication: Taking as directed and without issues.  Self managed - yes   Covid-19: Precautions and sickness symptoms discussed. Wears mask, social distancing, hand hygiene as appropriate.   Activities of Daily Living Patient denies needing assistance with: household chores, feeding themselves, getting from bed to chair, getting to the toilet, bathing/showering, dressing, managing money, or preparing meals.   Discussed the importance of a healthy diet, water intake and the benefits of aerobic exercise.  Physical activity- walking as tolerated.  Diet:  Regular Water: good intake  Other Providers Patient Care Team: McLean-Scocuzza, Nino Glow, MD as PCP - General (Internal Medicine)  Hearing/Vision screen  Hearing Screening   125Hz  250Hz  500Hz  1000Hz  2000Hz  3000Hz  4000Hz  6000Hz  8000Hz   Right ear:           Left ear:           Comments: Patient is able to hear conversational tones without difficulty.  No issues reported.  Vision Screening Comments: Wears corrective lenses Visual acuity not assessed, virtual visit.       Dietary issues and exercise activities discussed: Current Exercise Habits: The patient does not participate in regular exercise at present  Goals      Patient Stated   . Increase physical activity (pt-stated)     Resume walking regimen      Depression Screen PHQ 2/9 Scores 06/19/2019 06/17/2018 12/18/2017  PHQ - 2 Score 0 0 0    Fall Risk Fall Risk  06/19/2019 06/17/2018 12/18/2017  Falls in the past year? 0 0  No  Risk for fall  due to : Post anesthesia - -   Timed Get Up and Go Performed no, virtual visit  Cognitive Function:     6CIT Screen 06/19/2019 06/17/2018  What Year? 0 points 0 points  What month? 0 points 0 points  What time? 0 points 0 points  Count back from 20 - 0 points  Months in reverse - 0 points  Repeat phrase - 0 points  Total Score - 0    Screening Tests Health Maintenance  Topic Date Due  . DEXA SCAN  01/28/2015  . PNA vac Low Risk Adult (1 of 2 - PCV13) 06/18/2020 (Originally 01/28/2015)  . MAMMOGRAM  01/27/2021  . TETANUS/TDAP  06/28/2025  . COLONOSCOPY  10/14/2026  . INFLUENZA VACCINE  Completed  . Hepatitis C Screening  Completed       Plan:   Keep all routine maintenance appointments.   Medicare Attestation I have personally reviewed: The patient's medical and social history Their use of alcohol, tobacco or illicit drugs Their current medications and supplements The patient's functional ability including ADLs,fall risks, home safety risks, cognitive, and hearing and visual impairment Diet and physical activities Evidence for depression   I have reviewed and discussed with patient certain preventive protocols, quality metrics, and best practice recommendations.      Varney Biles, LPN   [id number]    Agree  [place]

## 2019-06-19 NOTE — Progress Notes (Signed)
Telephone Note  I connected with Tonya Green  on 06/19/19 at  9:00 AM EST by telephone and verified that I am speaking with the correct person using two identifiers.  Location patient: home Location provider:work or home office Persons participating in the virtual visit: patient, provider  I discussed the limitations of evaluation and management by telemedicine and the availability of in person appointments. The patient expressed understanding and agreed to proceed.   HPI: 1. Hypothyroidism on levo 50 mcg qd doing well  2. Lumbar radiculopathy chronic since injury years ago pain flares a times and she takes prn vicodin and valium which help f/u Dr. Ronnette Juniper pain clinic upcoming she did not respond to epidural injection and does not want to purse this again  3. HLD declines to be on statin due to side effects    ROS: See pertinent positives and negatives per HPI.  Past Medical History:  Diagnosis Date  . Carotid artery stenosis    mild L>R 03/26/16 [place]   . Diverticulosis    colonoscopy 10/13/16 [place]  . Factor 5 Leiden mutation, heterozygous ([place])   . History of blood transfusion    birth Rh factor   . Hyperlipidemia   . Hypertension   . OSA on CPAP   . Radiculopathy   . Right leg DVT ([place])    2010 s/p back surgery was on Coumadin x 6 months   . Thyroid disease    hypothyroidism     Past Surgical History:  Procedure Laterality Date  . APPENDECTOMY     2000  . BREAST EXCISIONAL BIOPSY Left 2005   for infection  . BREAST SURGERY     biospy ? year   . ruptured disc repair     L4/5 [id number] with complications residual right leg weakness/reduced sensation and DVT in 2010 post surgery     Family History  Problem Relation Age of Onset  . Hypertension Mother   . Stroke Mother        in 65s   . Heart disease Father   . Stroke Father   . Cancer Brother        esophageal cancer   . Stroke Paternal Grandfather   . 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  ALPHA LIPOIC ACID PO, Take 250 mg by mouth., Disp: , Rfl:  .  amLODipine (NORVASC) 2.5 MG tablet, Take 1 tablet (2.5 mg total) by mouth daily. In am if BP>140/?90 take another tablet, Disp: 180 tablet, Rfl: 3 .  Ascorbic Acid (VITAMIN C) 1000 MG tablet, Take 1,000 mg by mouth 3 (three) times daily., Disp: , Rfl:  .  aspirin EC 81 MG tablet, Take 81 mg by mouth daily., Disp: , Rfl:  .  Cholecalciferol (VITAMIN D3) 5000 units CAPS, Take by mouth., Disp: , Rfl:  .  diazepam (VALIUM) 2 MG tablet, 1/2 pill to 1 pill qhs prn, Disp: 30 tablet, Rfl: 2 .  gabapentin (NEURONTIN) 100 MG capsule, Take 3 capsules (300 mg total) by mouth 3 (three) times daily., Disp: 560 capsule, Rfl: 5 .  HYDROcodone-acetaminophen (NORCO/VICODIN) 5-325 MG tablet, Take 1 tablet by mouth 2 (two) times daily as needed (only for  flare ups)., Disp: 10 tablet, Rfl: 0 .  levothyroxine (SYNTHROID) 50 MCG tablet, Take 1 tablet (50 mcg total) by mouth daily before breakfast., Disp: 90 tablet, Rfl: 3 .  MAGNESIUM MALATE PO, Take 625 mg by mouth., Disp: , Rfl:  .  naproxen (NAPROSYN) 500 MG tablet, Take 1 tablet (500 mg total) by mouth 2 (two) times daily with a meal., Disp: 20 tablet, Rfl: 00 .  Nattokinase 100 MG CAPS, Take 100 mg by mouth daily., Disp: , Rfl:  .  NON FORMULARY, Mannose 500 mg qid  nattokinase 100 mg qd, Disp: , Rfl:  .  Strontium Chloride POWD, Use as directed 500 mg in the mouth or throat. , Disp: , Rfl:   EXAM:  VITALS per patient if applicable:  GENERAL: alert, oriented, appears well and in no acute distress  PSYCH/NEURO: pleasant and cooperative, no obvious depression or anxiety, speech and thought processing grossly intact  ASSESSMENT AND PLAN:  Discussed  the following assessment and plan:  Hypothyroidism, unspecified type - Plan: TSH, levothyroxine (SYNTHROID) 50 MCG tablet  Acute cystitis without hematuria - Plan: Urine Culture  Hyperlipidemia, unspecified hyperlipidemia type - Plan: Lipid panel, CRP High sensitivity  Essential hypertension BP trend normally at goal controlled <130/<80 unless in pain then 140-150s  Continue to monitor  Lumbar radiculopathy  F/u pain clinic  Consider refill valium and vicodin prn will need substance contract signed   HM Declines flu vaccine, prevnar, pna 23 vaccine Had zostervax 2012 shingrix2/2 had 2nd dose which caused flare of lumbar radiculopathy  06/03/19 pfizer covid vx 2nd due 07/01/19 Td? Tdap had 06/29/15 declines Tdap for nowlump in arm x 3 months  Hep c neg 01/10/17  A1C 5.5 01/10/17   Out of age window pap h/o abnormal pap in 20s but normal since then   Colonoscopy had 10/13/16 diverticulosis reviewed letter will need report in future  -Colonoscopy 10/13/16 diverticulosis [facility] Dr. Cathlean Cower fax # [phone]  Mammogram had 01/28/19 normal   DEXA1/2019(h/o osteopenia hip and osteoporosis spine) get records prior PCP  -per pt this is why taking Strontium5000 mg qdand had helped with osteoporosisimproved to osteopenia over 3 years -re ordered bone density 06/22/2019  Dr. Santiago Glad tbse h/o BCC forehead and SCC nose needs tbseappt sch 9/9/19no bxs normal examappt upcoming 2021   eye exam in the future h/o flashes of light left eye resolved but negative retina issues/detachment per pt will see eye exam yearly Mohs surgical site 05/11/19 trouble hearing    Never smoker  Life line screening 03/26/16 left mild CAS No Afib  Nl ABI  TC 298, HDL 52, LDL 212, TG 170 declines statin   Pain Dr. Ronnette Juniper epidural 06/01/18 suboptimal outcome did not help still with persistent burning and f/u 04/27/19 f/u 06/30/19   Has MOST form   -we discussed possible  serious and likely etiologies, options for evaluation and workup, limitations of telemedicine visit vs in person visit, treatment, treatment risks and precautions. Pt prefers to treat via telemedicine empirically rather then risking or undertaking an in person visit at this moment. Patient agrees to seek prompt in person care if worsening, new symptoms arise, or if is not improving with treatment.   I discussed the assessment and treatment plan with the patient. The patient was provided an opportunity to ask questions and all were answered. The patient agreed with the plan and demonstrated an understanding of the instructions.   The patient was advised to call back or  seek an in-person evaluation if the symptoms worsen or if the condition fails to improve as anticipated.  Time spent 20 minutes Delorise Jackson, MD

## 2019-06-22 ENCOUNTER — Ambulatory Visit
Admission: RE | Admit: 2019-06-22 | Discharge: 2019-06-22 | Disposition: A | Discharge status: Home / Self Care | Payer: Medicare Other | Source: Ambulatory Visit | Attending: Internal Medicine | Admitting: Internal Medicine

## 2019-06-22 DIAGNOSIS — M810 Age-related osteoporosis without current pathological fracture: Secondary | ICD-10-CM | POA: Insufficient documentation

## 2019-07-03 ENCOUNTER — Other Ambulatory Visit (INDEPENDENT_AMBULATORY_CARE_PROVIDER_SITE_OTHER): Payer: Medicare Other

## 2019-07-03 ENCOUNTER — Other Ambulatory Visit: Payer: Self-pay

## 2019-07-03 ENCOUNTER — Encounter: Payer: Self-pay | Admitting: Internal Medicine

## 2019-07-03 DIAGNOSIS — E785 Hyperlipidemia, unspecified: Secondary | ICD-10-CM

## 2019-07-03 DIAGNOSIS — Z0000 Encounter for general adult medical examination without abnormal findings: Secondary | ICD-10-CM | POA: Diagnosis not present

## 2019-07-03 DIAGNOSIS — D72829 Elevated white blood cell count, unspecified: Secondary | ICD-10-CM | POA: Diagnosis not present

## 2019-07-03 DIAGNOSIS — E039 Hypothyroidism, unspecified: Secondary | ICD-10-CM | POA: Diagnosis not present

## 2019-07-03 DIAGNOSIS — N3000 Acute cystitis without hematuria: Secondary | ICD-10-CM

## 2019-07-03 LAB — CBC WITH DIFFERENTIAL/PLATELET
Basophils Absolute: 0.1 10*3/uL (ref 0.0–0.1)
Basophils Relative: 1.2 % (ref 0.0–3.0)
Eosinophils Absolute: 0.2 10*3/uL (ref 0.0–0.7)
Eosinophils Relative: 3.1 % (ref 0.0–5.0)
HCT: 40.1 % (ref 36.0–46.0)
Hemoglobin: 13.4 g/dL (ref 12.0–15.0)
Lymphocytes Relative: 47.8 % — ABNORMAL HIGH (ref 12.0–46.0)
Lymphs Abs: 2.9 10*3/uL (ref 0.7–4.0)
MCHC: 33.4 g/dL (ref 30.0–36.0)
MCV: 92.5 fl (ref 78.0–100.0)
Monocytes Absolute: 0.7 10*3/uL (ref 0.1–1.0)
Monocytes Relative: 11.4 % (ref 3.0–12.0)
Neutro Abs: 2.2 10*3/uL (ref 1.4–7.7)
Neutrophils Relative %: 36.5 % — ABNORMAL LOW (ref 43.0–77.0)
Platelets: 267 10*3/uL (ref 150.0–400.0)
RBC: 4.34 Mil/uL (ref 3.87–5.11)
RDW: 13 % (ref 11.5–15.5)
WBC: 6 10*3/uL (ref 4.0–10.5)

## 2019-07-03 LAB — LIPID PANEL
Cholesterol: 320 mg/dL — ABNORMAL HIGH (ref 0–200)
HDL: 50.2 mg/dL (ref >39.00)
LDL Cholesterol: 232 mg/dL — ABNORMAL HIGH (ref 0–99)
NonHDL: 269.5
Total CHOL/HDL Ratio: 6
Triglycerides: 187 mg/dL — ABNORMAL HIGH (ref 0.0–149.0)
VLDL: 37.4 mg/dL (ref 0.0–40.0)

## 2019-07-03 LAB — TSH: TSH: 2.04 u[IU]/mL (ref 0.35–4.50)

## 2019-07-03 LAB — COMPREHENSIVE METABOLIC PANEL
ALT: 36 U/L — ABNORMAL HIGH (ref 0–35)
AST: 23 U/L (ref 0–37)
Albumin: 4.3 g/dL (ref 3.5–5.2)
Alkaline Phosphatase: 82 U/L (ref 39–117)
BUN: 14 mg/dL (ref 6–23)
CO2: 27 mEq/L (ref 19–32)
Calcium: 9.4 mg/dL (ref 8.4–10.5)
Chloride: 102 mEq/L (ref 96–112)
Creatinine, Ser: 0.82 mg/dL (ref 0.40–1.20)
GFR: 69.04 mL/min (ref >60.00)
Glucose, Bld: 94 mg/dL (ref 70–99)
Potassium: 3.7 mEq/L (ref 3.5–5.1)
Sodium: 135 mEq/L (ref 135–145)
Total Bilirubin: 0.4 mg/dL (ref 0.2–1.2)
Total Protein: 7.6 g/dL (ref 6.0–8.3)

## 2019-07-03 LAB — HIGH SENSITIVITY CRP: CRP, High Sensitivity: 3.33 mg/L (ref 0.000–5.000)

## 2019-07-04 LAB — URINE CULTURE
MICRO NUMBER:: 10121359
SPECIMEN QUALITY:: ADEQUATE

## 2019-07-06 ENCOUNTER — Encounter: Payer: Self-pay | Admitting: Internal Medicine

## 2019-09-22 ENCOUNTER — Encounter: Payer: Self-pay | Admitting: Internal Medicine

## 2019-09-22 ENCOUNTER — Ambulatory Visit (INDEPENDENT_AMBULATORY_CARE_PROVIDER_SITE_OTHER): Payer: Medicare Other | Admitting: Internal Medicine

## 2019-09-22 ENCOUNTER — Other Ambulatory Visit: Payer: Self-pay

## 2019-09-22 VITALS — BP 146/90 | HR 70 | Temp 97.4°F | Ht 65.0 in | Wt 168.8 lb

## 2019-09-22 DIAGNOSIS — M62838 Other muscle spasm: Secondary | ICD-10-CM

## 2019-09-22 DIAGNOSIS — N3000 Acute cystitis without hematuria: Secondary | ICD-10-CM

## 2019-09-22 DIAGNOSIS — E039 Hypothyroidism, unspecified: Secondary | ICD-10-CM

## 2019-09-22 DIAGNOSIS — Z1329 Encounter for screening for other suspected endocrine disorder: Secondary | ICD-10-CM

## 2019-09-22 DIAGNOSIS — Z789 Other specified health status: Secondary | ICD-10-CM

## 2019-09-22 DIAGNOSIS — F419 Anxiety disorder, unspecified: Secondary | ICD-10-CM | POA: Diagnosis not present

## 2019-09-22 DIAGNOSIS — E785 Hyperlipidemia, unspecified: Secondary | ICD-10-CM | POA: Diagnosis not present

## 2019-09-22 DIAGNOSIS — R7989 Other specified abnormal findings of blood chemistry: Secondary | ICD-10-CM

## 2019-09-22 DIAGNOSIS — Z0184 Encounter for antibody response examination: Secondary | ICD-10-CM

## 2019-09-22 DIAGNOSIS — I10 Essential (primary) hypertension: Secondary | ICD-10-CM

## 2019-09-22 DIAGNOSIS — K760 Fatty (change of) liver, not elsewhere classified: Secondary | ICD-10-CM | POA: Insufficient documentation

## 2019-09-22 LAB — HEPATIC FUNCTION PANEL
ALT: 39 U/L — ABNORMAL HIGH (ref 0–35)
AST: 29 U/L (ref 0–37)
Albumin: 4.6 g/dL (ref 3.5–5.2)
Alkaline Phosphatase: 85 U/L (ref 39–117)
Bilirubin, Direct: 0.1 mg/dL (ref 0.0–0.3)
Total Bilirubin: 0.5 mg/dL (ref 0.2–1.2)
Total Protein: 7.5 g/dL (ref 6.0–8.3)

## 2019-09-22 MED ORDER — DIAZEPAM 2 MG PO TABS: ORAL_TABLET | ORAL | 2 refills | Status: DC

## 2019-09-22 NOTE — Patient Instructions (Signed)
Will check liver enzymes and covid antibodies

## 2019-09-22 NOTE — Progress Notes (Signed)
Chief Complaint  Patient presents with  . Follow-up   F/u  1 anxiety worse since daughter 70 y.o has L5/S1 injury similar to her prior back injury causing long term pain  2. hld declines statin taking Dr. Talmage Coin advanced cholesterol supplement with vit C, bergamot, tocotrienal 1 pill qd  3. 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 Hypothyroidism controlled on levo 50 mcg qd  5. HTN on norvasc 2.5 mg qd today reviewed BP log 120s-130s/80s only taking QD  Review of Systems  Constitutional: Negative for weight loss.  HENT: Negative for hearing loss.   Eyes: Negative for blurred vision.  Respiratory: Negative for shortness of breath.   Cardiovascular: Negative for chest pain.  Gastrointestinal: Negative for abdominal pain.  Musculoskeletal: Negative for falls.  Skin: Negative for rash.  Neurological: Negative for headaches.  Psychiatric/Behavioral: The patient is nervous/anxious.    Past Medical History:  Diagnosis Date  . Basal cell carcinoma    left nasal sidewall 05/11/[facility] Dr. Winifred [facility]  . Carotid artery stenosis    mild L>R 03/26/16 [place]   . Diverticulosis    colonoscopy 10/13/16 [place]  . Factor 5 Leiden mutation, heterozygous ([place])   . History of blood transfusion    birth Rh factor   . Hyperlipidemia   . Hypertension   . OSA on CPAP   . Radiculopathy   . Right leg DVT ([place])    2010 s/p back surgery was on Coumadin x 6 months   . Thyroid disease    hypothyroidism    Past Surgical History:  Procedure Laterality Date  . APPENDECTOMY     2000  . BREAST EXCISIONAL BIOPSY Left 2005   for infection  . BREAST SURGERY     biospy ? year   . MOHS SURGERY  05/11/2019   left nasal side wall bcc Dr. Winifred Olive in [place]   . ruptured disc repair     L4/5 [id number] with complications residual right leg weakness/reduced sensation and DVT in 2010 post surgery    Family History  Problem Relation Age of Onset  . Hypertension Mother   .  Stroke Mother        in 82s   . Heart disease Father   . Stroke Father   . Cancer Brother        esophageal cancer   . Stroke Paternal Grandfather   . Hyperlipidemia Other    Social History   Socioeconomic History  . Marital status: Married    Spouse name: Not on file  . Number of children: Not on file  . Years of education: Not on file  . Highest education level: Not on file  Occupational History  . Not on file  Tobacco Use  . Smoking status: Never Smoker  . Smokeless tobacco: Never Used  Substance and Sexual Activity  . Alcohol use: Yes    Comment: occ  . Drug use: Never  . Sexual activity: Not on file  Other Topics Concern  . Not on file  Social History Narrative   BSN CCM, RN   Moved from [place] recently    2 daughters    Retired and married    No guns, wears seat belt, safe in relationship    Lives at [place] Determinants of Health   Financial Resource Strain: [place]   . Difficulty of Paying Living Expenses: Not hard at all  Food Insecurity: No Food Insecurity  . Worried  About Running Out of Food in the Last Year: Never true  . Ran Out of Food in the Last Year: Never true  Transportation Needs: No Transportation Needs  . Lack of Transportation (Medical): No  . Lack of Transportation (Non-Medical): No  Physical Activity:   . Days of Exercise per Week:   . Minutes of Exercise per Session:   Stress: No Stress Concern Present  . Feeling of Stress : Not at all  Social Connections: Unknown  . Frequency of Communication with Friends and Family: More than three times a week  . Frequency of Social Gatherings with Friends and Family: Not on file  . Attends Religious Services: Not on file  . Active Member of Clubs or Organizations: Not on file  . Attends Archivist Meetings: Not on file  . Marital Status: Married  Human resources officer Violence: Not At Risk  . Fear of Current or Ex-Partner: No  . Emotionally Abused: No  . Physically  Abused: No  . Sexually Abused: No   Current Meds  Medication Sig  . ALPHA LIPOIC ACID PO Take 250 mg by mouth.  Marland Kitchen amLODipine (NORVASC) 2.5 MG tablet Take 1 tablet (2.5 mg total) by mouth daily. In am if BP>140/?90 take another tablet  . Ascorbic Acid (VITAMIN C) 1000 MG tablet Take 1,000 mg by mouth 3 (three) times daily.  . Cholecalciferol (VITAMIN D3) 5000 units CAPS Take by mouth.  . diazepam (VALIUM) 2 MG tablet 1/2 pill to 1 pill qhs prn  . gabapentin (NEURONTIN) 100 MG capsule Take 3 capsules (300 mg total) by mouth 3 (three) times daily.  Marland Kitchen HYDROcodone-acetaminophen (NORCO/VICODIN) 5-325 MG tablet Take 1 tablet by mouth 2 (two) times daily as needed (only for flare ups).  Marland Kitchen levothyroxine (SYNTHROID) 50 MCG tablet Take 1 tablet (50 mcg total) by mouth daily before breakfast.  . MAGNESIUM MALATE PO Take 625 mg by mouth.  . naproxen (NAPROSYN) 500 MG tablet Take 1 tablet (500 mg total) by mouth 2 (two) times daily with a meal.  . Nattokinase 100 MG CAPS Take 100 mg by mouth daily.  . NON FORMULARY Mannose 500 mg qid  nattokinase 100 mg qd  . NON FORMULARY Bergamot orange extract 500mg / tocotricenol and tocophile complex 100mg   . Strontium Chloride POWD Use as directed 500 mg in the mouth or throat.   Marland Kitchen UNABLE TO FIND Med Name: Omega Q plus Max calamarine  . [DISCONTINUED] diazepam (VALIUM) 2 MG tablet 1/2 pill to 1 pill qhs prn   Allergies  Allergen Reactions  . Macrobid [Nitrofurantoin Macrocrystal]     Nausea and sob   . Sulfa Antibiotics     Swelling     Recent Results (from the past 2160 hour(s))  CRP High sensitivity     Status: None   Collection Time: 07/03/19 10:36 AM  Result Value Ref Range   CRP, High Sensitivity 3.330 0.000 - 5.000 mg/L    Comment: Note:  An elevated hs-CRP (>5 mg/L) should be repeated after 2 weeks to rule out recent infection or trauma.  Urine Culture     Status: None   Collection Time: 07/03/19 10:36 AM   Specimen: Blood  Result Value Ref  Range   MICRO NUMBER: [id number]    SPECIMEN QUALITY: Adequate    Sample Source NOT GIVEN    STATUS: FINAL    Result:      Less than 10,000 CFU/mL of single Gram positive organism isolated. No further testing will be performed.  If clinically indicated, recollection using a method to minimize contamination, with prompt transfer to Urine Culture Transport Tube, is recommended.  TSH     Status: None   Collection Time: 07/03/19 10:36 AM  Result Value Ref Range   TSH 2.04 0.35 - 4.50 uIU/mL  CBC w/Diff     Status: Abnormal   Collection Time: 07/03/19 10:36 AM  Result Value Ref Range   WBC 6.0 4.0 - 10.5 K/uL   RBC 4.34 3.87 - 5.11 Mil/uL   Hemoglobin 13.4 12.0 - 15.0 g/dL   HCT 40.1 36.0 - 46.0 %   MCV 92.5 78.0 - 100.0 fl   MCHC 33.4 30.0 - 36.0 g/dL   RDW 13.0 11.5 - 15.5 %   Platelets 267.0 150.0 - 400.0 K/uL   Neutrophils Relative % 36.5 (L) 43.0 - 77.0 %   Lymphocytes Relative 47.8 (H) 12.0 - 46.0 %   Monocytes Relative 11.4 3.0 - 12.0 %   Eosinophils Relative 3.1 0.0 - 5.0 %   Basophils Relative 1.2 0.0 - 3.0 %   Neutro Abs 2.2 1.4 - 7.7 K/uL   Lymphs Abs 2.9 0.7 - 4.0 K/uL   Monocytes Absolute 0.7 0.1 - 1.0 K/uL   Eosinophils Absolute 0.2 0.0 - 0.7 K/uL   Basophils Absolute 0.1 0.0 - 0.1 K/uL  Lipid panel     Status: Abnormal   Collection Time: 07/03/19 10:36 AM  Result Value Ref Range   Cholesterol 320 (H) 0 - 200 mg/dL    Comment: ATP III Classification       Desirable:  < 200 mg/dL               Borderline High:  200 - 239 mg/dL          High:  > = 240 mg/dL   Triglycerides 187.0 (H) 0.0 - 149.0 mg/dL    Comment: Normal:  <150 mg/dLBorderline High:  150 - 199 mg/dL   HDL 50.20 >39.00 mg/dL   VLDL 37.4 0.0 - 40.0 mg/dL   LDL Cholesterol 232 (H) 0 - 99 mg/dL   Total CHOL/HDL Ratio 6     Comment:                Men          Women1/2 Average Risk     3.4          3.3Average Risk          5.0          4.42X Average Risk          9.6          7.13X Average Risk          15.0           11.0                       NonHDL 269.50     Comment: NOTE:  Non-HDL goal should be 30 mg/dL higher than patient's LDL goal (i.e. LDL goal of < 70 mg/dL, would have non-HDL goal of < 100 mg/dL)  Comprehensive metabolic panel     Status: Abnormal   Collection Time: 07/03/19 10:36 AM  Result Value Ref Range   Sodium 135 135 - 145 mEq/L   Potassium 3.7 3.5 - 5.1 mEq/L   Chloride 102 96 - 112 mEq/L   CO2 27 19 - 32 mEq/L   Glucose, Bld 94 70 - 99 mg/dL   BUN  14 6 - 23 mg/dL   Creatinine, Ser 0.82 0.40 - 1.20 mg/dL   Total Bilirubin 0.4 0.2 - 1.2 mg/dL   Alkaline Phosphatase 82 39 - 117 U/L   AST 23 0 - 37 U/L   ALT 36 (H) 0 - 35 U/L   Total Protein 7.6 6.0 - 8.3 g/dL   Albumin 4.3 3.5 - 5.2 g/dL   GFR 69.04 >60.00 mL/min   Calcium 9.4 8.4 - 10.5 mg/dL   Objective  Body mass index is 28.09 kg/m. Wt Readings from Last 3 Encounters:  09/22/19 168 lb 12.8 oz (76.6 kg)  06/19/19 167 lb (75.8 kg)  06/19/19 167 lb (75.8 kg)   Temp Readings from Last 3 Encounters:  09/22/19 (!) 97.4 F (36.3 C) (Skin)  11/15/18 98.5 F (36.9 C)  06/17/18 97.9 F (36.6 C) (Oral)   BP Readings from Last 3 Encounters:  09/22/19 (!) 146/90  11/15/18 (!) 154/88  07/04/18 (!) 160/90   Pulse Readings from Last 3 Encounters:  09/22/19 70  11/15/18 66  07/04/18 80    Physical Exam Vitals and nursing note reviewed.  Constitutional:      Appearance: Normal appearance. She is well-developed and well-groomed.  HENT:     Head: Normocephalic and atraumatic.  Eyes:     Conjunctiva/sclera: Conjunctivae normal.     Pupils: Pupils are equal, round, and reactive to light.  Cardiovascular:     Rate and Rhythm: Normal rate and regular rhythm.     Heart sounds: Normal heart sounds. No murmur.  Pulmonary:     Effort: Pulmonary effort is normal.     Breath sounds: Normal breath sounds.  Abdominal:     General: Abdomen is flat. Bowel sounds are normal.     Tenderness: There is no abdominal  tenderness.  Skin:    General: Skin is warm and dry.  Neurological:     General: No focal deficit present.     Mental Status: She is alert and oriented to person, place, and time. Mental status is at baseline.     Gait: Gait normal.  Psychiatric:        Attention and Perception: Attention and perception normal.        Mood and Affect: Mood and affect normal.        Speech: Speech normal.        Behavior: Behavior normal. Behavior is cooperative.        Thought Content: Thought content normal.        Cognition and Memory: Cognition and memory normal.        Judgment: Judgment normal.     Assessment  Plan  Hyperlipidemia, unspecified hyperlipidemia type -  Plan: Lipid panel, Comprehensive metabolic panel Declines statin see HPI  Unknown status of immunity to COVID-19 virus - Plan: SARS CoV2 Serology(COVID19) AB(IgG,IgM),Immunoassay  Muscle spasm - Plan: diazepam (VALIUM) 2 MG tablet  Elevated liver function tests - Plan: Hepatic function panel, Comprehensive metabolic panel  Essential hypertension - Plan: CBC with Differential/Platelet Cont norvasc 2.5 mg qd to bid   Hypothyroidism, unspecified type - Plan: TSH -on levo 50 mcg qd   Situational anxiety  -monitor no intervention for now  HTN  Cont norvasc 2.5 qd to bid    [facility] Fasting labs 12/31/19 [facility]  covid ab today c/w not protected since had steroid epidural and then 1 day after covid vx Declines flu vaccine, prevnar, pna 23 vaccine Had zostervax 2012 shingrix2/2 had 2nd dose which caused flare  of lumbar radiculopathy  06/03/19 pfizer covid vx,  07/01/19 Td? Tdap had 06/29/15 declines Tdap for nowlump in arm x 3 months  Hep c neg 01/10/17  A1C 5.5 01/10/17   Out of age window pap h/o abnormal pap in 20s but normal since then   Colonoscopy had 10/13/16 diverticulosis reviewed letter will need report in future  -Colonoscopy 10/13/16 diverticulosis [facility] Dr. Cathlean Cower fax # [phone]  Mammogram had 01/28/19 normal wait until 06/21/21 per pt request with DEXA  DEXA1/2019(h/o osteopenia hip and osteoporosis spine) get records prior PCP  -per pt this is why taking Strontium5000 mg qdand had helped with osteoporosisimproved to osteopenia over 3 years -DEXA -3.0 left radius ostepenia lumbar/hip 06/22/2019 wait 2 years for repeat  Dr. Santiago Glad tbse h/o BCC forehead and SCC nose needs tbseappt sch 9/9/19no bxs normal examappt upcoming 2021 eye exam in the future h/o flashes of light left eye resolved but negative retina issues/detachment per pt will see eye exam yearly Mohs surgical site 05/11/19 trouble hearing    Never smoker  Life line screening 03/26/16 left mild CAS No Afib  Nl ABI  TC 298, HDL 52, LDL 212, TG 170 declines statin   Pain Dr. Ronnette Juniper epidural 06/01/18 suboptimal outcome did not help still with persistent burning and f/u 04/27/19 f/u 06/30/19   Has MOST form   Provider: Dr. Olivia Mackie McLean-Scocuzza-Internal Medicine

## 2019-09-23 ENCOUNTER — Other Ambulatory Visit: Payer: Self-pay | Admitting: Internal Medicine

## 2019-09-23 ENCOUNTER — Encounter: Payer: Self-pay | Admitting: Internal Medicine

## 2019-09-23 DIAGNOSIS — R7989 Other specified abnormal findings of blood chemistry: Secondary | ICD-10-CM

## 2019-09-23 LAB — SARS COV-2 SEROLOGY(COVID-19)AB(IGG,IGM),IMMUNOASSAY
SARS CoV-2 AB IgG: NEGATIVE
SARS CoV-2 IgM: NEGATIVE

## 2019-09-23 NOTE — Addendum Note (Signed)
Addended by: Orland Mustard on: 09/23/2019 12:38 PM   Modules accepted: Orders

## 2019-09-23 NOTE — Telephone Encounter (Signed)
Liver enzymes slightly elevated  Do you want me to order an [place] of your liver  Covid test pending

## 2019-09-25 ENCOUNTER — Encounter: Payer: Self-pay | Admitting: Internal Medicine

## 2019-09-29 ENCOUNTER — Ambulatory Visit
Admission: RE | Admit: 2019-09-29 | Discharge: 2019-09-29 | Disposition: A | Discharge status: Home / Self Care | Payer: Medicare Other | Source: Ambulatory Visit | Attending: Internal Medicine | Admitting: Internal Medicine

## 2019-09-29 ENCOUNTER — Other Ambulatory Visit: Payer: Self-pay

## 2019-09-29 DIAGNOSIS — R7989 Other specified abnormal findings of blood chemistry: Secondary | ICD-10-CM | POA: Insufficient documentation

## 2019-09-29 IMAGING — US US ABDOMEN COMPLETE
1 series · 14 of 25 positions shown · non-contrast
Comparison: None.

CLINICAL DATA: Elevated liver function tests.

EXAM:
ABDOMEN ULTRASOUND COMPLETE

[Series 1: us abdomen complete · 14 of 135 slices shown]
[im 1/135]
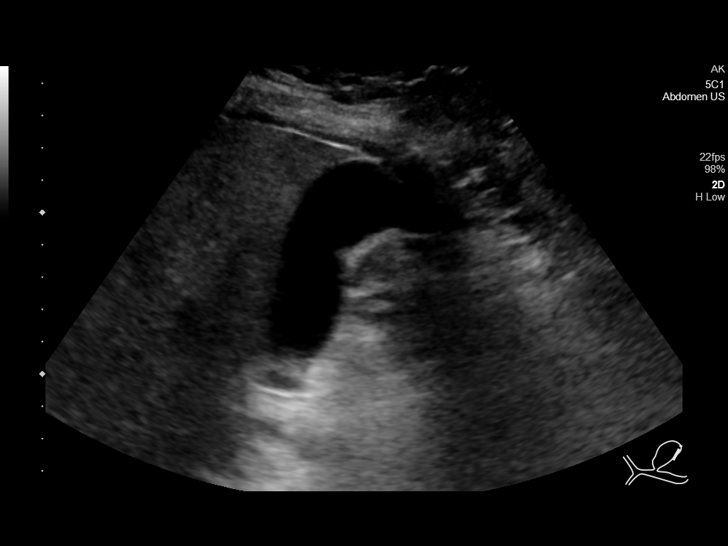
[im 12/135]
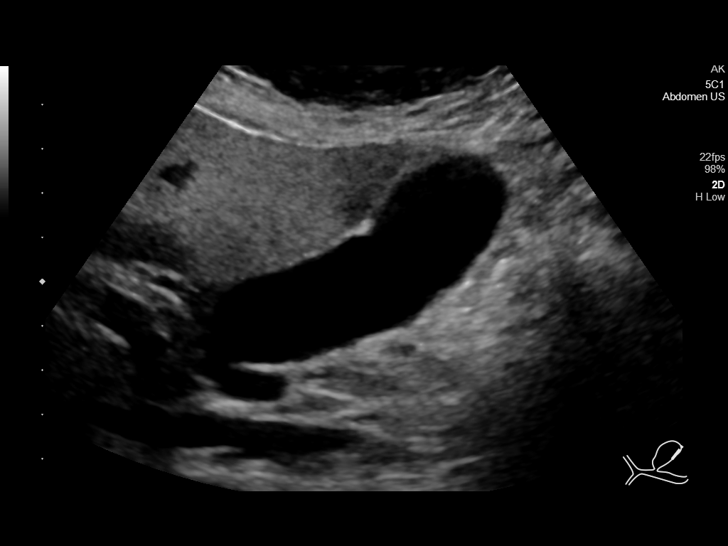
[im 23/135]
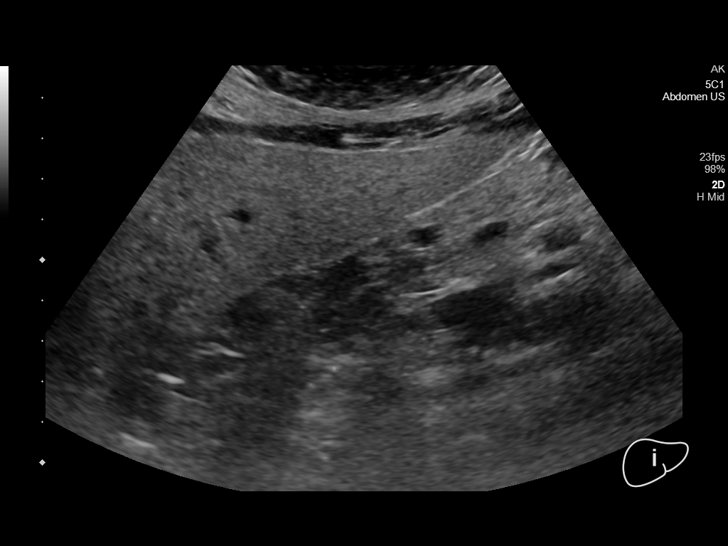
[im 34/135]
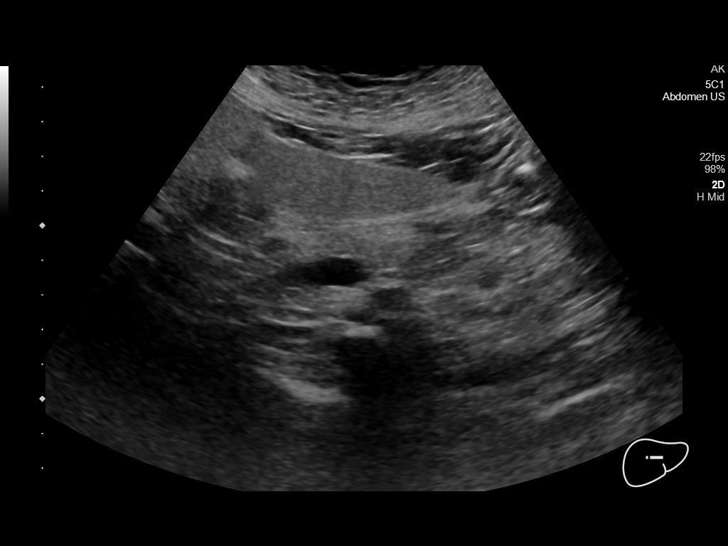
[im 45/135]
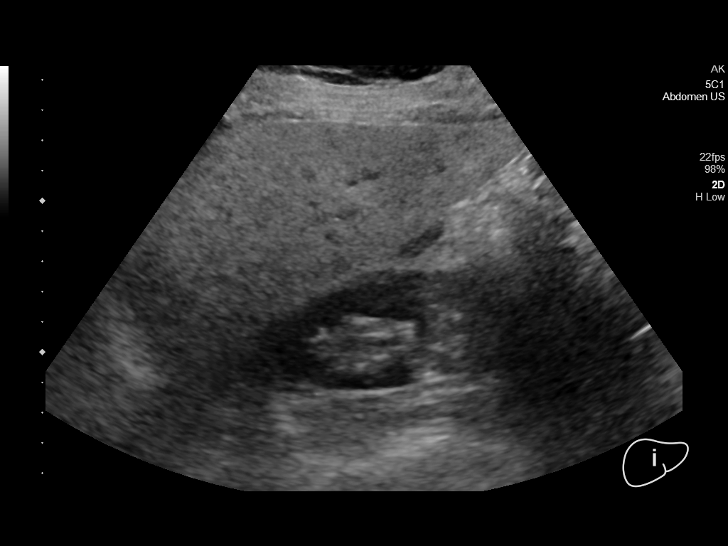
[im 51/135]
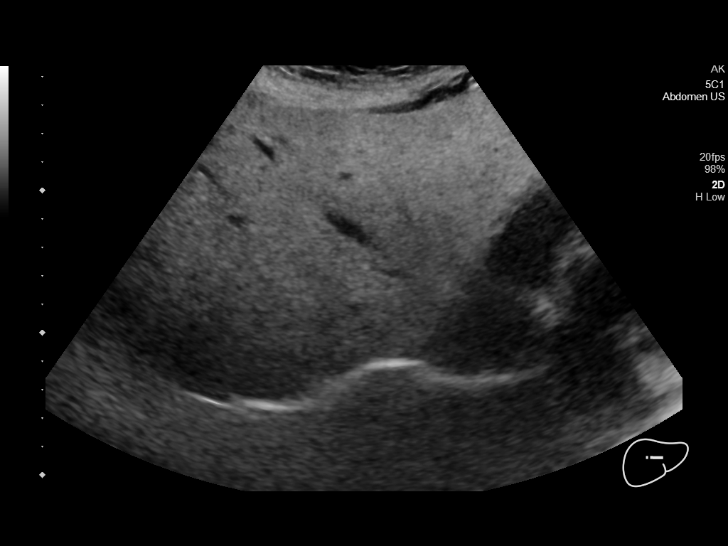
[im 62/135]
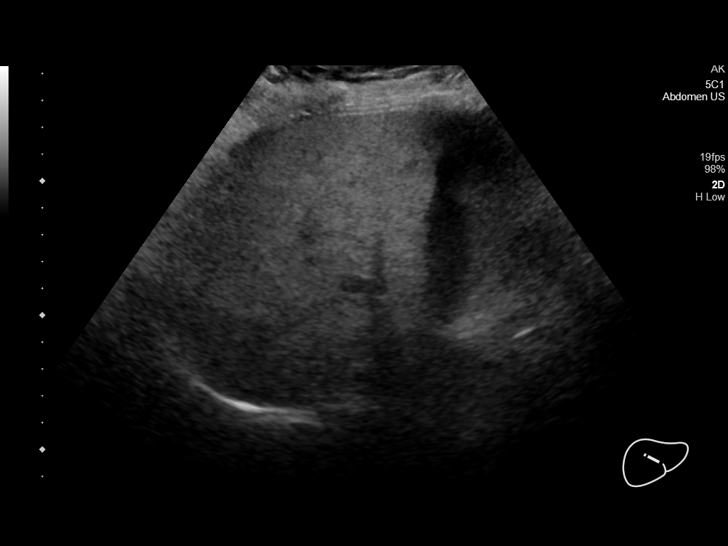
[im 73/135]
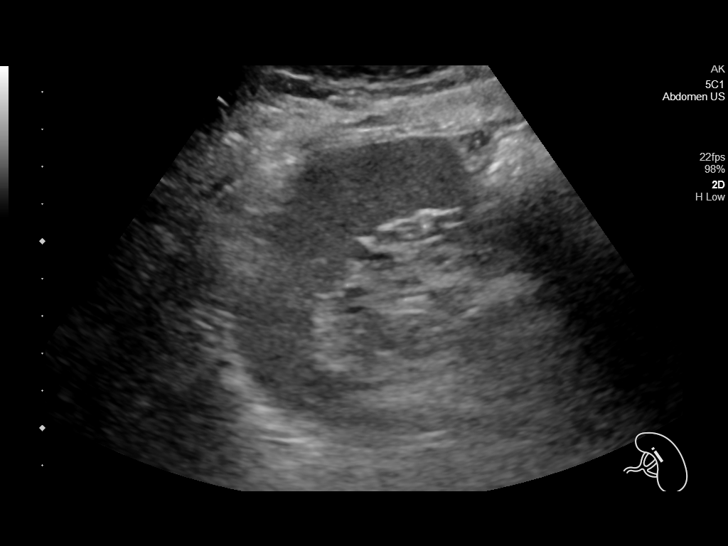
[im 84/135]
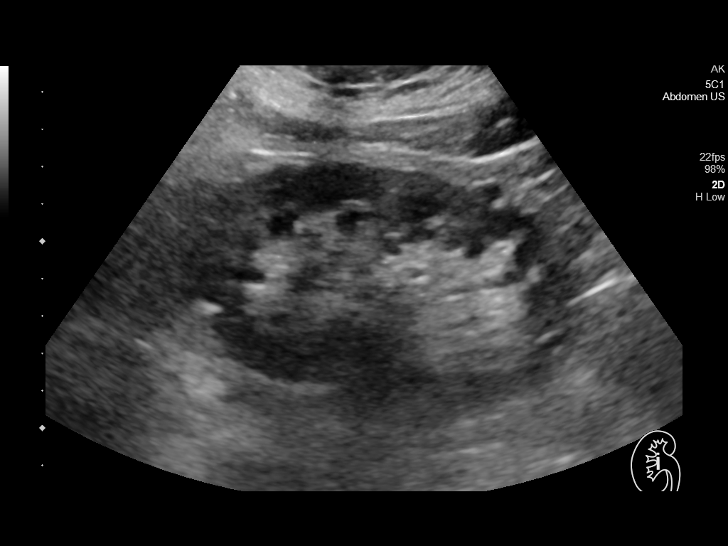
[im 90/135]
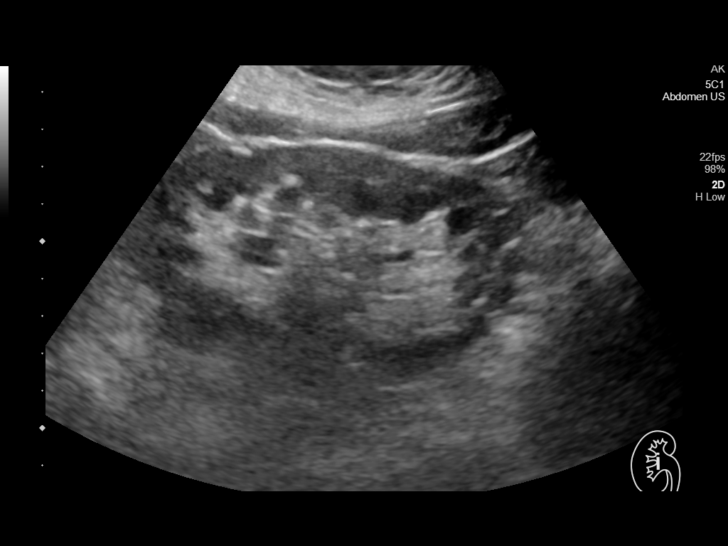
[im 101/135]
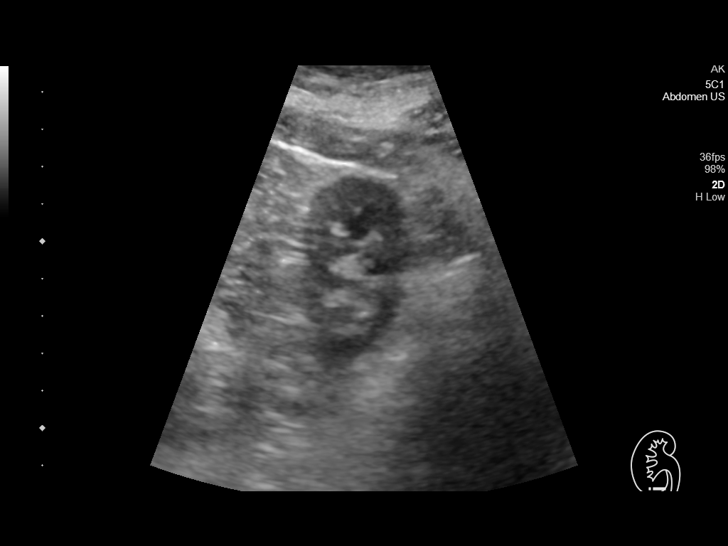
[im 112/135]
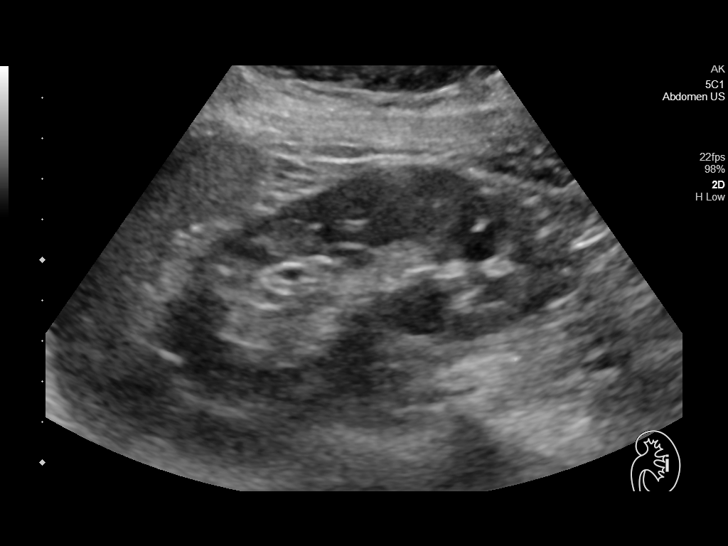
[im 123/135]
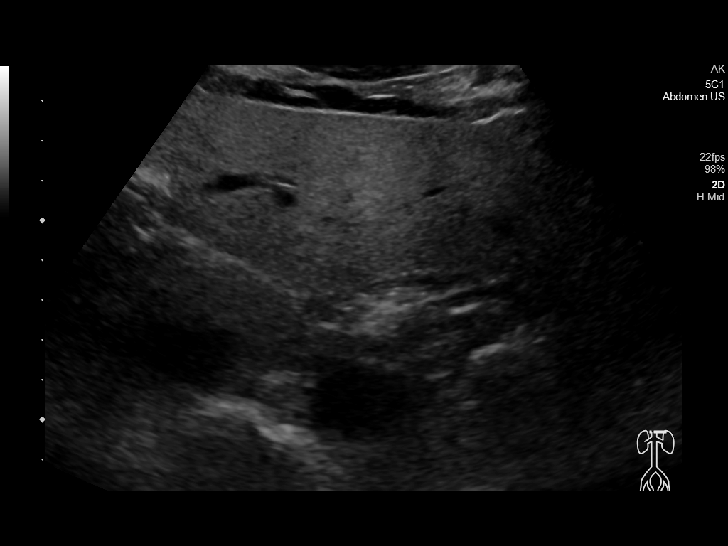
[im 135/135]
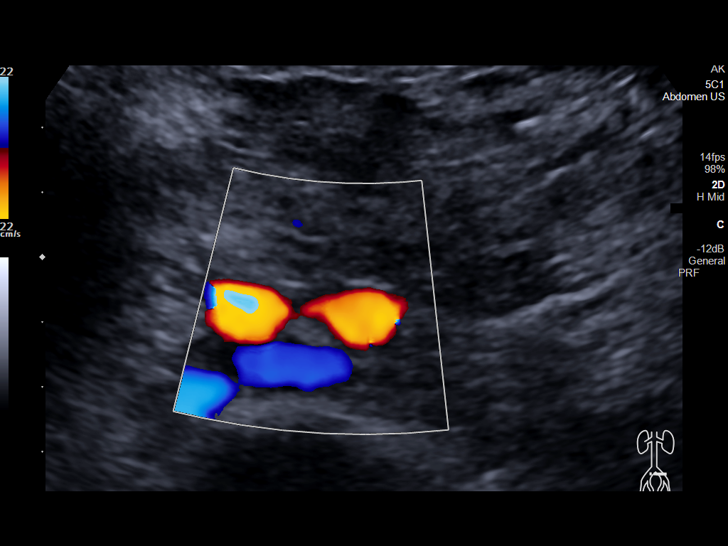

[14 of 25 positions shown; findings below may reference images not displayed]

FINDINGS: Gallbladder: No gallstones or wall thickening visualized. No
sonographic Murphy sign noted by sonographer.

Common bile duct: Diameter: 2 mm

Liver: Increased parenchymal echogenicity with somewhat
heterogeneous echotexture. No mass or focal lesion. Portal vein is
patent on color Doppler imaging with normal direction of blood flow
towards the liver.

IVC: No abnormality visualized.

Pancreas: Visualized portion unremarkable.

Spleen: Size and appearance within normal limits.

Right Kidney: Length: 10.7 cm. Echogenicity within normal limits. No
mass or hydronephrosis visualized.

Left Kidney: Length: 11.1 cm. Echogenicity within normal limits. No
mass or hydronephrosis visualized.

Abdominal aorta: No aneurysm visualized.

Other findings: None.
IMPRESSION: 1. No acute findings.  Normal gallbladder.  No bile duct dilation.
2. Increased liver parenchymal echogenicity consistent with hepatic
steatosis. No liver mass.

## 2019-10-13 ENCOUNTER — Encounter: Payer: Self-pay | Admitting: Internal Medicine

## 2019-12-05 ENCOUNTER — Encounter: Payer: Self-pay | Admitting: Internal Medicine

## 2019-12-07 NOTE — Telephone Encounter (Signed)
Please advise 

## 2020-01-12 ENCOUNTER — Telehealth: Payer: Self-pay | Admitting: Internal Medicine

## 2020-01-12 NOTE — Telephone Encounter (Signed)
Labs 01/06/20  Blood cts overall normal but lymphocytes slightly elevated absolute 3.4 and normal 3.1 will monitor likely reactive  Sugar glucose normal 95  Creatinine 0.68 kidney #  Kidney numbers normal  Urine normal  tSH 2.810 normal thyroid lab   Cholesterol elevated  Total 335 normal <200 , triglycerides 157 normal <150, HDL 53 at goal for woman and LDL 252 H (goal <100) -I would consider cardiology to discuss tx options I.e repatha injectable Q2 week or monthly med for high cholesterol  -let me know if pt wants this please  [place]

## 2020-01-18 ENCOUNTER — Encounter: Payer: Self-pay | Admitting: Internal Medicine

## 2020-01-18 NOTE — Telephone Encounter (Signed)
Patient informed and verbalized understanding.   Patient states she has been vaccinated but tested negative for any antibodies. Patient was wondering if, when they allow booster shots, will she then show immunity by holding antibodies?   Patient informed that Dr Olivia Mackie McLean-Scocuzza is out of office until 02/01/20 and is okay to wait until then.

## 2020-02-29 ENCOUNTER — Encounter: Payer: Self-pay | Admitting: Internal Medicine

## 2020-03-01 ENCOUNTER — Encounter: Payer: Self-pay | Admitting: Internal Medicine

## 2020-03-07 NOTE — Addendum Note (Signed)
Addended by: Orland Mustard on: 03/07/2020 01:21 PM   Modules accepted: Orders

## 2020-03-18 ENCOUNTER — Other Ambulatory Visit: Payer: Self-pay | Admitting: Internal Medicine

## 2020-03-18 DIAGNOSIS — Z1231 Encounter for screening mammogram for malignant neoplasm of breast: Secondary | ICD-10-CM

## 2020-03-31 ENCOUNTER — Encounter: Payer: Self-pay | Admitting: Internal Medicine

## 2020-03-31 ENCOUNTER — Other Ambulatory Visit: Payer: Self-pay

## 2020-03-31 ENCOUNTER — Ambulatory Visit (INDEPENDENT_AMBULATORY_CARE_PROVIDER_SITE_OTHER): Payer: Medicare Other | Admitting: Internal Medicine

## 2020-03-31 VITALS — BP 134/82 | HR 58 | Temp 97.7°F | Ht 65.0 in | Wt 155.0 lb

## 2020-03-31 DIAGNOSIS — Z20822 Contact with and (suspected) exposure to covid-19: Secondary | ICD-10-CM

## 2020-03-31 DIAGNOSIS — G8929 Other chronic pain: Secondary | ICD-10-CM | POA: Diagnosis not present

## 2020-03-31 DIAGNOSIS — I10 Essential (primary) hypertension: Secondary | ICD-10-CM

## 2020-03-31 DIAGNOSIS — M5416 Radiculopathy, lumbar region: Secondary | ICD-10-CM

## 2020-03-31 DIAGNOSIS — E039 Hypothyroidism, unspecified: Secondary | ICD-10-CM

## 2020-03-31 DIAGNOSIS — Z0184 Encounter for antibody response examination: Secondary | ICD-10-CM

## 2020-03-31 DIAGNOSIS — G629 Polyneuropathy, unspecified: Secondary | ICD-10-CM

## 2020-03-31 MED ORDER — LEVOTHYROXINE SODIUM 50 MCG PO TABS: 50.0000 ug | ORAL_TABLET | Freq: Every day | ORAL | 3 refills | Status: DC

## 2020-03-31 MED ORDER — AMLODIPINE BESYLATE 2.5 MG PO TABS: 2.5000 mg | ORAL_TABLET | Freq: Every day | ORAL | 3 refills | Status: DC

## 2020-03-31 MED ORDER — HYDROCODONE-ACETAMINOPHEN 5-325 MG PO TABS: 1.0000 | ORAL_TABLET | Freq: Two times a day (BID) | ORAL | 0 refills | Status: DC | PRN

## 2020-03-31 NOTE — Progress Notes (Signed)
Chief Complaint  Patient presents with  . Follow-up   F/u  1. HTN controlled mostly reviewed BP log BP running 120s-130s/70s-80s at times 109/83 on norvasc 2.5 mg qam in am mostly taking 1x per day can take up to 2 x per day   2. She had jury duty 2021 but was excused due to after 2 pfizer vaccines in early 2021 no immunity in 08/2019 and she was c/w delta variant   3. S/p back surgery with chronic back pain and c/w neuropathy flares of pain in b/l legs will do MRI low back she is able to walk 1 hours and seeing PM&R Dr. Ronnette Juniper last seen 06/2019 having more right foot numbness lasting longer amount of time and has had steroid injection last MRI low back in 2013/2014 in [place]  She takes valium 2 mg prn at times and norco 5/320 prn with flares w/o concern with abuse  4. Hypothyroidism on levo 50 mcg qd   Review of Systems  Constitutional: Negative for weight loss.  HENT: Negative for hearing loss.   Eyes: Negative for blurred vision.  Respiratory: Negative for shortness of breath.   Cardiovascular: Negative for chest pain.  Gastrointestinal: Negative for abdominal pain.  Genitourinary: Negative for dysuria.  Musculoskeletal: Positive for back pain.  Skin: Negative for rash.  Neurological: Positive for sensory change.  Psychiatric/Behavioral: Negative for depression.   Past Medical History:  Diagnosis Date  . Basal cell carcinoma    left nasal sidewall [facility] Dr. Winifred Olive  . Carotid artery stenosis    mild L>R 03/26/16 [place]   . Diverticulosis    colonoscopy 10/13/16 [place]  . Factor 5 Leiden mutation, heterozygous ([place])   . Fatty liver   . History of blood transfusion    birth Rh factor   . Hyperlipidemia   . Hypertension   . OSA on CPAP   . Radiculopathy   . Right leg DVT ([place])    2010 s/p back surgery was on Coumadin x 6 months   . Thyroid disease    hypothyroidism    Past Surgical History:  Procedure Laterality Date  . APPENDECTOMY      2000  . BREAST EXCISIONAL BIOPSY Left 2005   for infection  . BREAST SURGERY     biospy ? year   . MOHS SURGERY  05/11/2019   left nasal side wall bcc Dr. Winifred Olive in [place]   . ruptured disc repair     L4/5 05/7614 with complications residual right leg weakness/reduced sensation and DVT in 2010 post surgery    Family History  Problem Relation Age of Onset  . Hypertension Mother   . Stroke Mother        in 4s   . Heart disease Father   . Stroke Father   . Cancer Brother        esophageal cancer   . Stroke Paternal Grandfather   . Hyperlipidemia Other   . Breast cancer Paternal Aunt    Social History   Socioeconomic History  . Marital status: Married    Spouse name: Not on file  . Number of children: Not on file  . Years of education: Not on file  . Highest education level: Not on file  Occupational History  . Not on file  Tobacco Use  . Smoking status: Never Smoker  . Smokeless tobacco: Never Used  Vaping Use  . Vaping Use: Never used  Substance and Sexual Activity  . Alcohol use:  Yes    Comment: occ  . Drug use: Never  . Sexual activity: Not on file  Other Topics Concern  . Not on file  Social History Narrative   BSN CCM, RN   Moved from [place] recently    2 daughters    Retired and married    No guns, wears seat belt, safe in relationship    Lives at [place] Determinants of Health   Financial Resource Strain: [place]   . Difficulty of Paying Living Expenses: Not hard at all  Food Insecurity: No Food Insecurity  . Worried About Charity fundraiser in the Last Year: Never true  . Ran Out of Food in the Last Year: Never true  Transportation Needs: No Transportation Needs  . Lack of Transportation (Medical): No  . Lack of Transportation (Non-Medical): No  Physical Activity: Not on file  Stress: No Stress Concern Present  . Feeling of Stress : Not at all  Social Connections: Unknown  . Frequency of Communication with Friends and Family: More  than three times a week  . Frequency of Social Gatherings with Friends and Family: Not on file  . Attends Religious Services: Not on file  . Active Member of Clubs or Organizations: Not on file  . Attends Archivist Meetings: Not on file  . Marital Status: Married  Human resources officer Violence: Not At Risk  . Fear of Current or Ex-Partner: No  . Emotionally Abused: No  . Physically Abused: No  . Sexually Abused: No   Current Meds  Medication Sig  . ALPHA LIPOIC ACID PO Take 250 mg by mouth.  Marland Kitchen amLODipine (NORVASC) 2.5 MG tablet Take 1 tablet (2.5 mg total) by mouth daily. In am if BP>140/?90 take another tablet  . Ascorbic Acid (VITAMIN C) 1000 MG tablet Take 1,000 mg by mouth 3 (three) times daily.  . Cholecalciferol (VITAMIN D3) 5000 units CAPS Take by mouth.  . diazepam (VALIUM) 2 MG tablet 1/2 pill to 1 pill qhs prn  . gabapentin (NEURONTIN) 100 MG capsule Take 3 capsules (300 mg total) by mouth 3 (three) times daily.  Marland Kitchen HYDROcodone-acetaminophen (NORCO/VICODIN) 5-325 MG tablet Take 1 tablet by mouth 2 (two) times daily as needed (only for flare ups).  Marland Kitchen levothyroxine (SYNTHROID) 50 MCG tablet Take 1 tablet (50 mcg total) by mouth daily before breakfast.  . MAGNESIUM MALATE PO Take 625 mg by mouth.  . Nattokinase 100 MG CAPS Take 100 mg by mouth daily.  . NON FORMULARY Mannose 500 mg qid  nattokinase 100 mg qd  . NON FORMULARY Bergamot orange extract 500mg / tocotricenol and tocophile complex 100mg   . Strontium Chloride POWD Use as directed 500 mg in the mouth or throat.   Marland Kitchen UNABLE TO FIND Med Name: Omega Q plus Max calamarine  . [DISCONTINUED] amLODipine (NORVASC) 2.5 MG tablet Take 1 tablet (2.5 mg total) by mouth daily. In am if BP>140/?90 take another tablet  . [DISCONTINUED] HYDROcodone-acetaminophen (NORCO/VICODIN) 5-325 MG tablet Take 1 tablet by mouth 2 (two) times daily as needed (only for flare ups).  . [DISCONTINUED] levothyroxine (SYNTHROID) 50 MCG tablet Take 1  tablet (50 mcg total) by mouth daily before breakfast.   Allergies  Allergen Reactions  . Macrobid [Nitrofurantoin Macrocrystal]     Nausea and sob   . Sulfa Antibiotics     Swelling     Recent Results (from the past 2160 hour(s))  SARS-CoV-2 Semi-Quantitative Total Antibody, Spike  Status: Abnormal   Collection Time: 03/31/20  9:39 AM  Result Value Ref Range   SARS COV2 AB, Total Spike Semi QN >2,500.0 (H) <0.8 U/mL    Comment: . INDEX          INTERPRETATION -----          --------------  <0.8          Negative > or = 0.8     Positive . This test is intended to help identify individuals with antibodies to SARS-CoV-2 (COVID-19). The results of this semi-quantitative test should not be interpreted as an indication or degree of immunity or protection from reinfection. . A test result that is 0.8 or more (Positive) means antibodies to SARS-CoV-2 were detected in the blood sample by the test. This could mean that the individual may have an immune response to a recent or prior infection with SARS-CoV-2. Positive results may occur after COVID-19 vaccination, but the clinical significance of a positive antibody result for individuals that have received a COVID-19 vaccine is unknown, and the performance of the test has not been established in COVID-19 vaccinees.  False positive results for the test may occur due to cross-reactivity from pre-existing antibodies or other possible causes. . . A  test result that is less than 0.8 (Negative) means that antibodies were not detected in the blood sample by the test. This could mean that the individual has not been previously infected with SARS-CoV-2. The clinical significance of a negative antibody result for individuals that have received a COVID-19 vaccine is unknown. The performance of the test has not been established in COVID-19 vaccinees. False negative results for the test may occur if the individual's antibodies have not  reached a sufficient level for the test to be able to detect them.  Antibodies can take up to two to three weeks (sometimes longer) to develop after someone is infected.  How long antibodies to SARS-CoV-2 last after infection is not known. . This test should not be used to diagnose an active SARS-CoV-2 infection. If an active infection is suspected, direct molecular or antigen testing for SARS -CoV-2 is recommended. Please review the "Fact Sheets" available for healthcare providers and  patients using the following websites: https://[email or web address]/home/Covid-19/HCP/ antibody/fact-sheet7 https://www.[facility].com/home/Covid-19/ Patients/antibody/fact-sheet7 . Healthcare Providers: For additional information please refer to http://education.[facility].com/ faq/FAQ219(This link is being provided for informational/educational purposes only.) . This test has been authorized by the FDA under an Emergency Use Authorization(EUA)for use by authorized laboratories. The FDA authorized labeling is available on the [employer] website: www.[facility]. com/Covid19. .    Objective  Body mass index is 25.79 kg/m. Wt Readings from Last 3 Encounters:  03/31/20 155 lb (70.3 kg)  09/22/19 168 lb 12.8 oz (76.6 kg)  06/19/19 167 lb (75.8 kg)   Temp Readings from Last 3 Encounters:  03/31/20 97.7 F (36.5 C) (Oral)  09/22/19 (!) 97.4 F (36.3 C) (Skin)  11/15/18 98.5 F (36.9 C)   BP Readings from Last 3 Encounters:  03/31/20 134/82  09/22/19 (!) 146/90  11/15/18 (!) 154/88   Pulse Readings from Last 3 Encounters:  03/31/20 (!) 58  09/22/19 70  11/15/18 66    Physical Exam Vitals and nursing note reviewed.  Constitutional:      Appearance: Normal appearance. She is well-developed, well-groomed and overweight.  HENT:     Head: Normocephalic and atraumatic.  Eyes:     Conjunctiva/sclera: Conjunctivae normal.     Pupils: Pupils are equal,  round, and reactive to light.  Cardiovascular:     Rate and Rhythm: Normal rate and regular rhythm.     Heart sounds: Normal heart sounds. No murmur heard.   Pulmonary:     Effort: Pulmonary effort is normal.     Breath sounds: Normal breath sounds.  Skin:    General: Skin is warm and dry.  Neurological:     General: No focal deficit present.     Mental Status: She is alert and oriented to person, place, and time. Mental status is at baseline.     Gait: Gait normal.  Psychiatric:        Attention and Perception: Attention and perception normal.        Mood and Affect: Mood and affect normal.        Speech: Speech normal.        Behavior: Behavior normal. Behavior is cooperative.        Thought Content: Thought content normal.        Cognition and Memory: Cognition and memory normal.        Judgment: Judgment normal.     Assessment  Plan  Essential hypertension - Plan: amLODipine (NORVASC) 2.5 MG tablet taking qd can take bid Controlled overall  Other chronic pain - Plan: HYDROcodone-acetaminophen (NORCO/VICODIN) 5-325 MG tablet Prn Valium 2 mg prn  Hypothyroidism, unspecified type - Plan: levothyroxine (SYNTHROID) 50 MCG tablet  Lumbar radiculopathy - Plan: MR Lumbar Spine Wo Contrast Neuropathy - Plan: MR Lumbar Spine Wo Contrast  MRI L spine 04/15/20 FINDINGS: Segmentation:  Standard.  Alignment:  Normal  Vertebrae:  No fracture, evidence of discitis, or bone lesion.  Conus medullaris and cauda equina: Conus extends to the T12-L1 level. Conus and cauda equina appear normal.  Paraspinal and other soft tissues: No perispinal mass or inflammation  Disc levels:  T12- L1: Shallow central protrusion.  No neural compression  L1-L2: Mild disc bulging.  No neural compression  L2-L3: Disc narrowing and bulging. Negative facets. No neural compression  L3-L4: Mild annulus bulging.  No neural compression  L4-L5: Mild disc narrowing with shallow central  protrusion. There may have been prior laminotomy at this level. No neural compression  L5-S1:Disc narrowing and bulging with small up turning disc protrusion. No neural compression.  IMPRESSION: Ordinary disc degeneration without impingement to explain radicular symptoms.  Pain Dr. Ronnette Juniper epidural 06/01/18 suboptimal outcome did not help still with persistent burning and f/u 04/27/19 f/u 06/30/19   [facility] Fasting [facility] 12[facility]  covid ab today c/w not protected since had steroid epidural and then 1 day after covid vx prev  Declined, prevnar, pna 23 vaccine in the past reassess in future Flu vaccine had 02/18/20  Had zostervax 2012 shingrix2/2 had 2nd dose which caused flare of lumbar radiculopathy  Pfizer 3/3 Tdaphad 2/1/1 Hep c neg 01/10/17  A1C 5.5 01/10/17  Out of age window pap h/o abnormal pap in 20s but normal since then   Colonoscopy had 10/13/16 diverticulosis reviewed letter will need report in future  -Colonoscopy 10/13/16 diverticulosis [facility] Dr. Cathlean Cower fax # [phone]  Mammogram had9/2/20normal wait until 06/21/21 per pt request with DEXA  DEXA1/2019(h/o osteopenia hip and osteoporosis spine) get records prior PCP  -per pt this is why taking Strontium5000 mg qdand had helped with osteoporosisimproved to osteopenia over 3 years -DEXA -3.0 left radius ostepenia lumbar/hip 06/22/2019 wait 2 years for repeat -declines pharm tx   Dr. 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 Olivia Mackie  [facility]

## 2020-04-03 LAB — SARS-COV-2 SEMI-QUANTITATIVE TOTAL ANTIBODY, SPIKE: SARS COV2 AB, Total Spike Semi QN: >2500 U/mL — ABNORMAL HIGH (ref <0.8)

## 2020-04-15 ENCOUNTER — Ambulatory Visit
Admission: RE | Admit: 2020-04-15 | Discharge: 2020-04-15 | Disposition: A | Discharge status: Home / Self Care | Payer: Medicare Other | Source: Ambulatory Visit | Attending: Internal Medicine | Admitting: Internal Medicine

## 2020-04-15 ENCOUNTER — Other Ambulatory Visit: Payer: Self-pay

## 2020-04-15 DIAGNOSIS — M5416 Radiculopathy, lumbar region: Secondary | ICD-10-CM | POA: Insufficient documentation

## 2020-04-15 DIAGNOSIS — G629 Polyneuropathy, unspecified: Secondary | ICD-10-CM | POA: Diagnosis present

## 2020-04-15 IMAGING — MR MR LUMBAR SPINE W/O CM
5 series · 31 of 48 positions shown · non-contrast
Comparison: None.

CLINICAL DATA: Lumbar radiculopathy on the right.

EXAM:
MRI LUMBAR SPINE WITHOUT CONTRAST
TECHNIQUE: Multiplanar, multisequence MR imaging of the lumbar spine was
performed. No intravenous contrast was administered.

[Series 5: T2 · sagittal · 4.0mm · 0.81mm/px · 6 of 17 slices shown (1 of 2)]
[im 1/17]
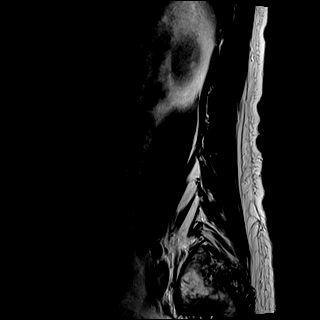
[im 4/17]
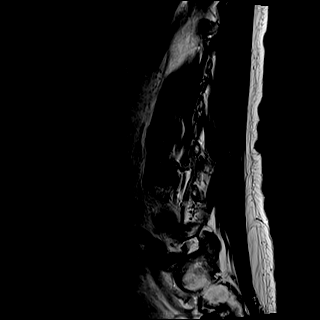
[im 7/17]
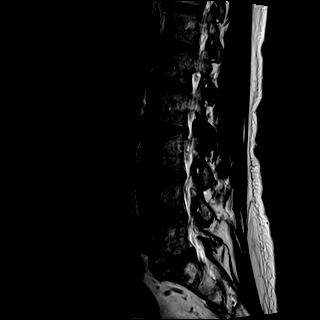
[im 10/17]
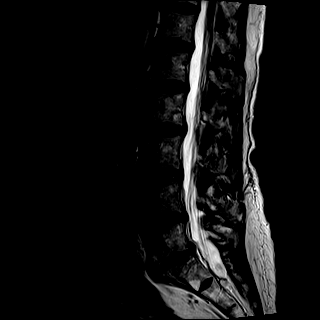
[im 13/17]
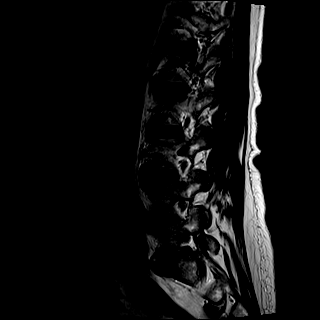
[im 17/17]
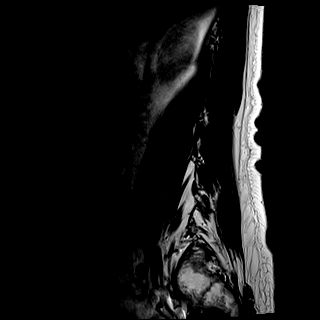

[Series 6: T1 · sagittal · 4.0mm · 0.81mm/px · 7 of 17 slices shown (1 of 2)]
[im 1/17]
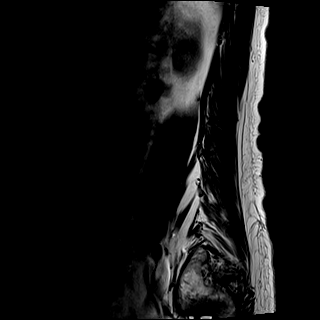
[im 3/17]
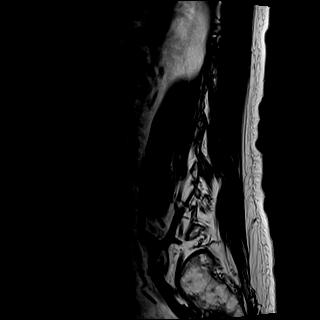
[im 6/17]
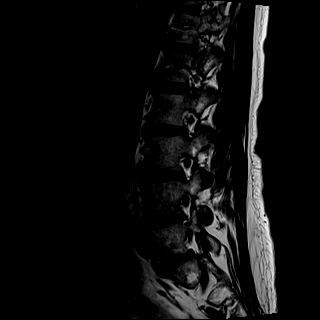
[im 9/17]
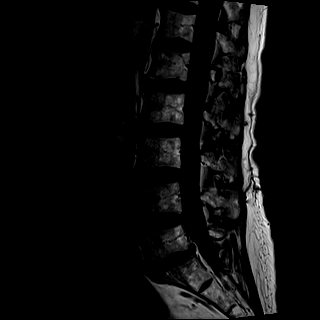
[im 11/17]
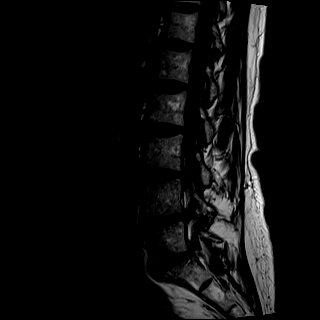
[im 14/17]
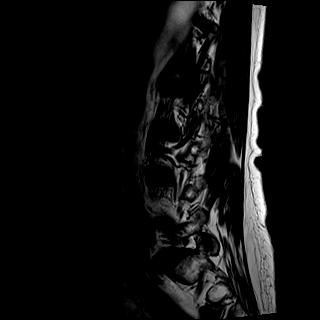
[im 17/17]
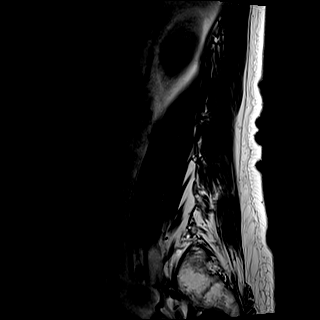

[Series 7: STIR · sagittal · 4.0mm · 0.41mm/px · 2 of 17 slices shown]
[im 1/17]
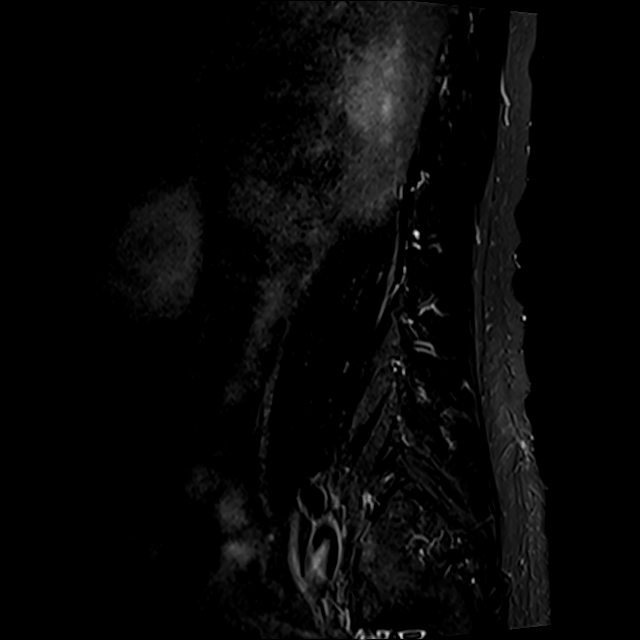
[im 3/17]
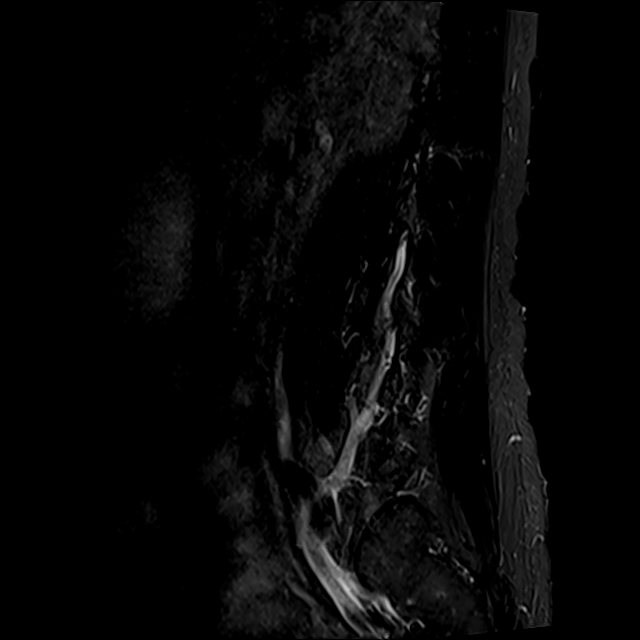

[Series 8: T2 · axial · 4.0mm · 0.78mm/px · z∈[-121,+79]mm · 8 of 36 slices shown (2 of 2)]
[im 1/36]
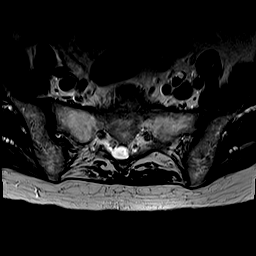
[im 6/36]
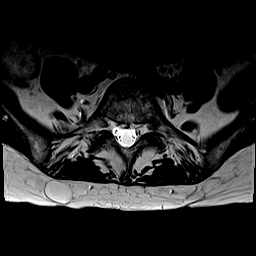
[im 11/36]
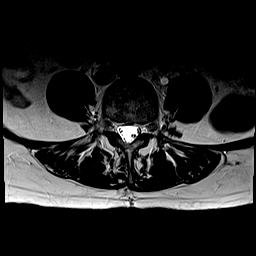
[im 17/36]
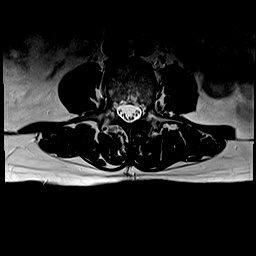
[im 19/36]
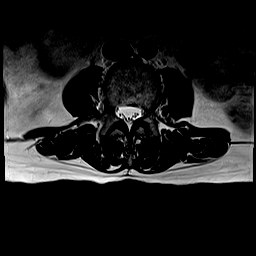
[im 25/36]
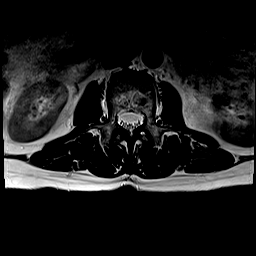
[im 30/36]
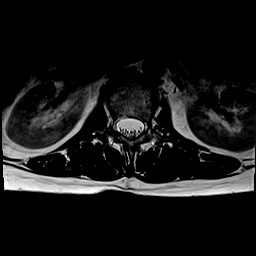
[im 36/36]
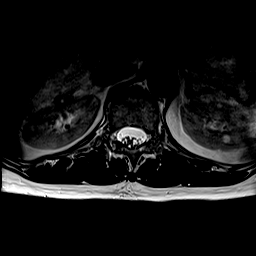

[Series 9: T1 · axial · 4.0mm · 0.39mm/px · z∈[-121,+79]mm · 8 of 36 slices shown (2 of 2)]
[im 1/36]
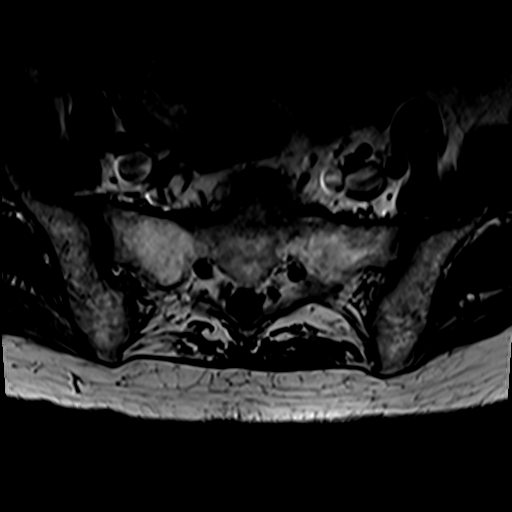
[im 6/36]
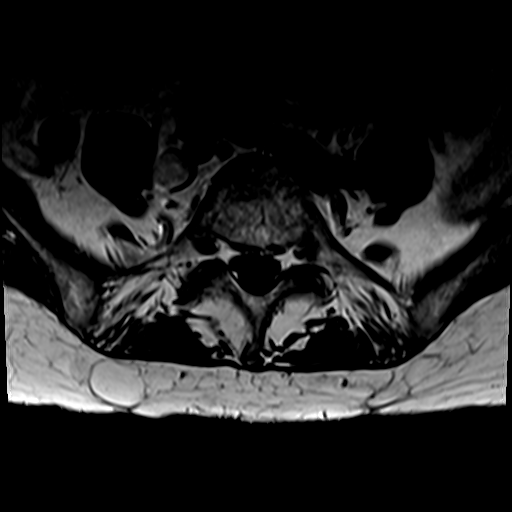
[im 11/36]
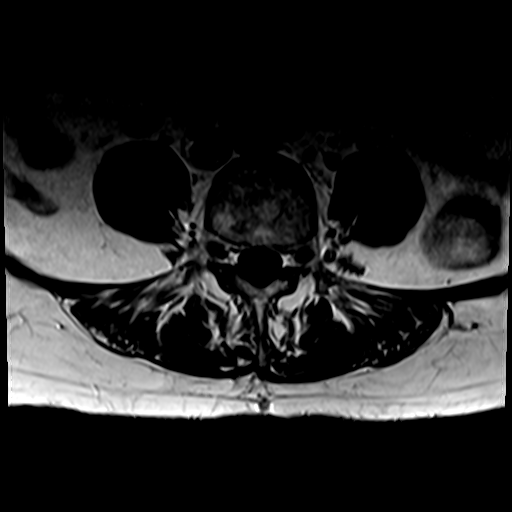
[im 17/36]
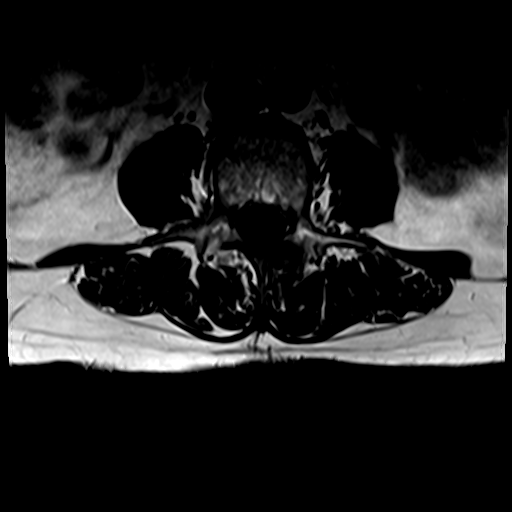
[im 19/36]
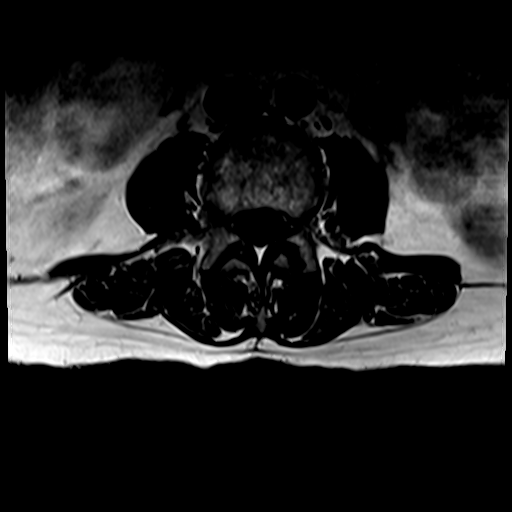
[im 25/36]
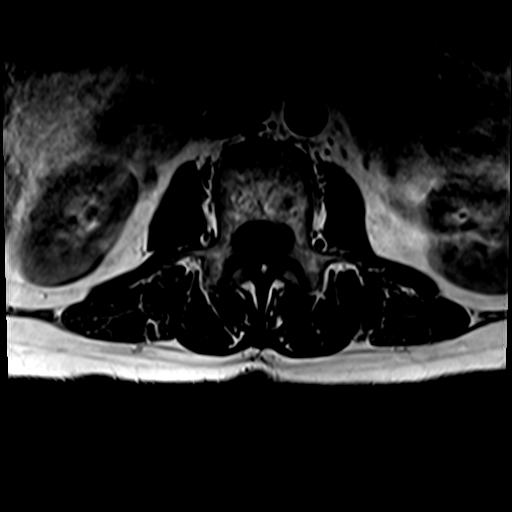
[im 30/36]
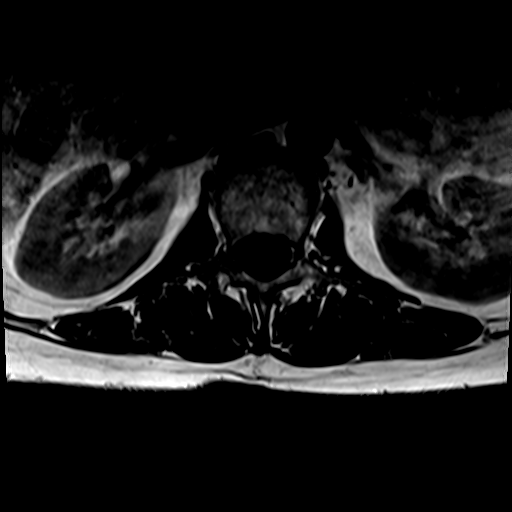
[im 36/36]
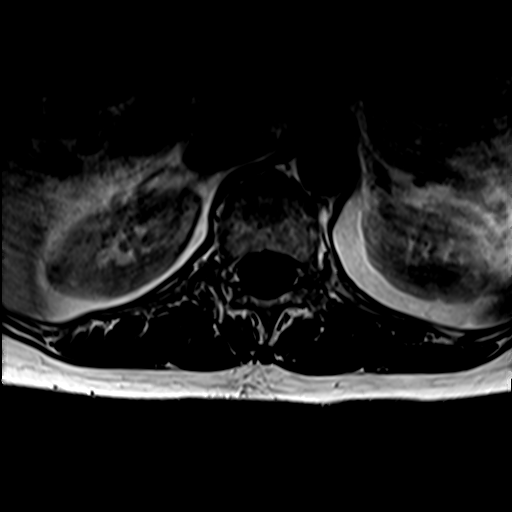

[31 of 48 positions shown; findings below may reference images not displayed]

FINDINGS: Segmentation:  Standard.

Alignment:  Normal

Vertebrae:  No fracture, evidence of discitis, or bone lesion.

Conus medullaris and cauda equina: Conus extends to the T12-L1
level. Conus and cauda equina appear normal.

Paraspinal and other soft tissues: No perispinal mass or
inflammation

Disc levels:

T12- L1: Shallow central protrusion.  No neural compression

L1-L2: Mild disc bulging.  No neural compression

L2-L3: Disc narrowing and bulging. Negative facets. No neural
compression

L3-L4: Mild annulus bulging.  No neural compression

L4-L5: Mild disc narrowing with shallow central protrusion. There
may have been prior laminotomy at this level. No neural compression

L5-S1:Disc narrowing and bulging with small up turning disc
protrusion. No neural compression.
IMPRESSION: Ordinary disc degeneration without impingement to explain radicular
symptoms.

## 2020-04-26 ENCOUNTER — Other Ambulatory Visit: Payer: Self-pay

## 2020-04-26 ENCOUNTER — Ambulatory Visit
Admission: RE | Admit: 2020-04-26 | Discharge: 2020-04-26 | Disposition: A | Discharge status: Home / Self Care | Payer: Medicare Other | Source: Ambulatory Visit | Attending: Internal Medicine | Admitting: Internal Medicine

## 2020-04-26 DIAGNOSIS — Z1231 Encounter for screening mammogram for malignant neoplasm of breast: Secondary | ICD-10-CM

## 2020-04-26 IMAGING — MG DIGITAL SCREENING BILAT W/ TOMO W/ CAD
8 series · 8 of 24 positions shown · non-contrast
Comparison: Previous exam(s).

CLINICAL DATA: Screening.

EXAM:
DIGITAL SCREENING BILATERAL MAMMOGRAM WITH TOMO AND CAD

[L CC synth-2D]
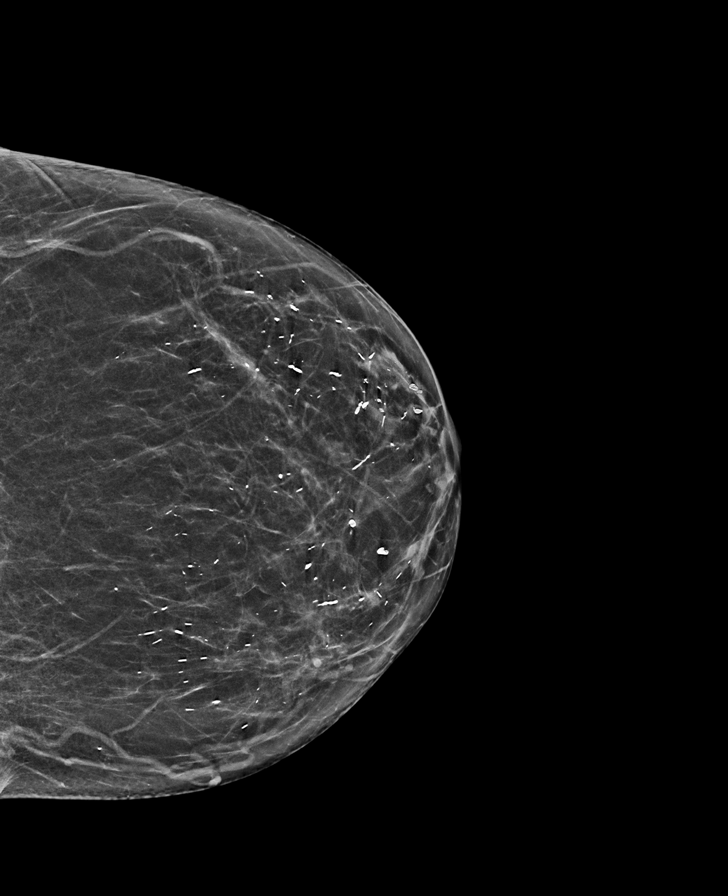

[R CC synth-2D]
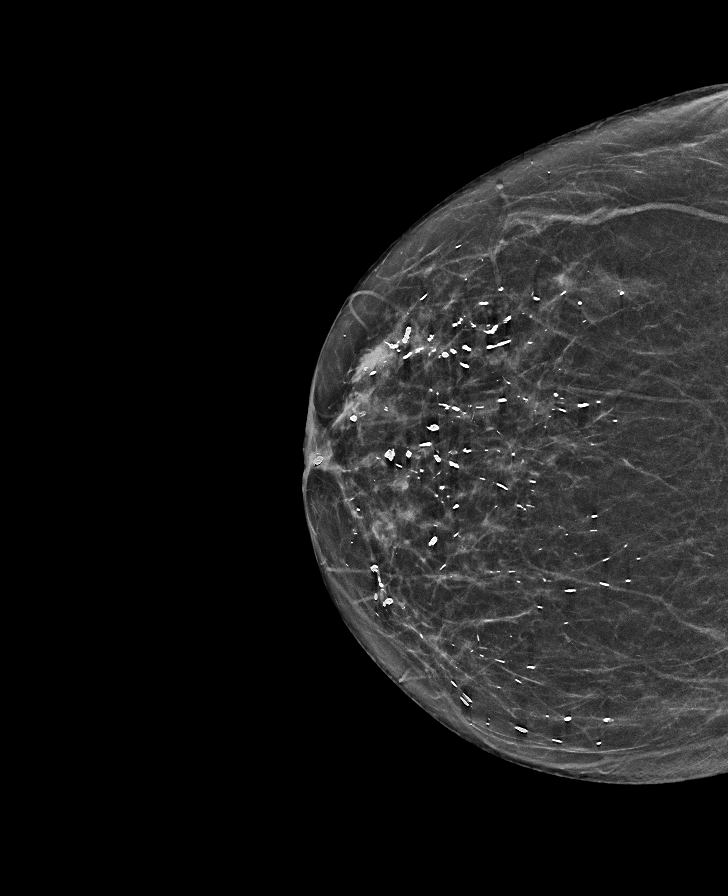

[L MLO synth-2D]
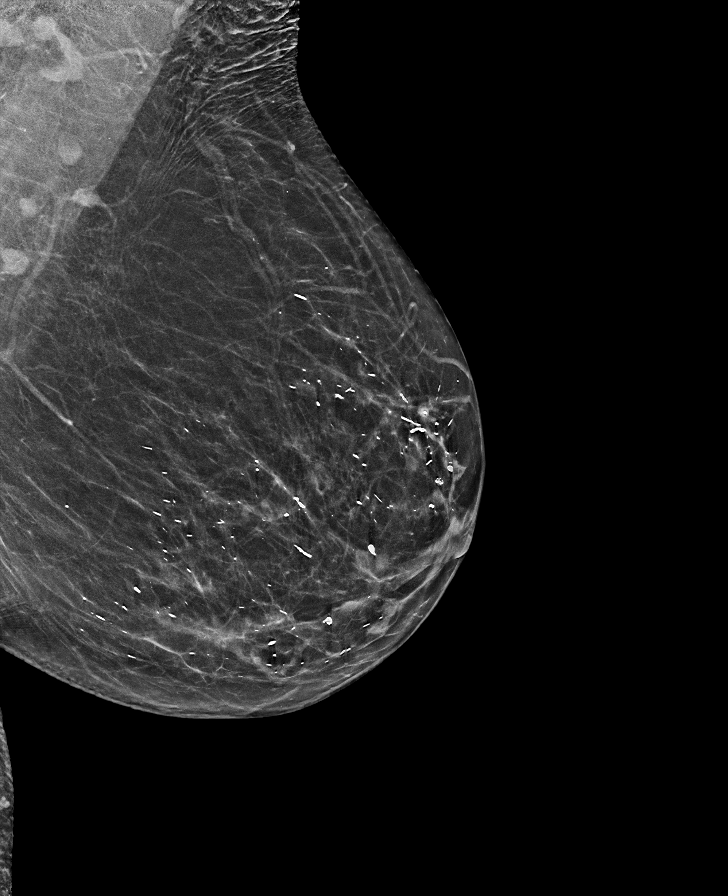

[R MLO synth-2D]
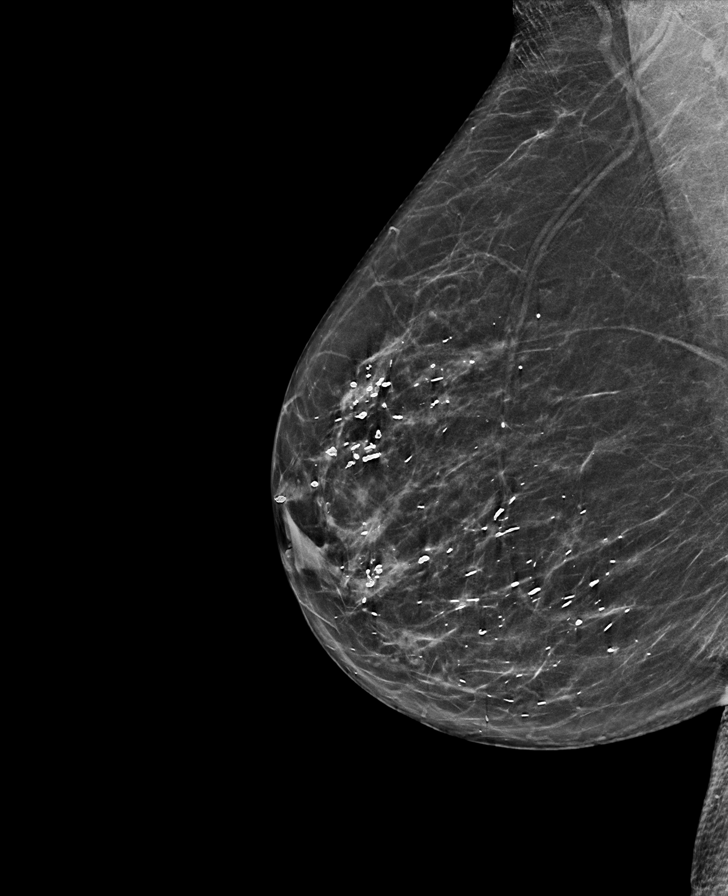

[R CC tomo · tomo slice 29/57.0]
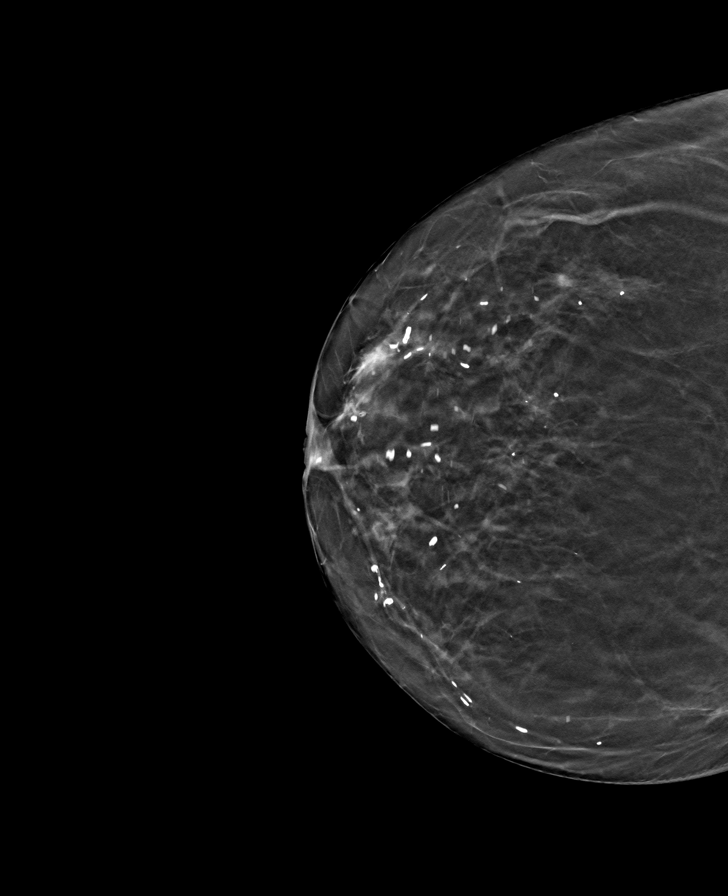

[L MLO tomo · tomo slice 31/62.0]
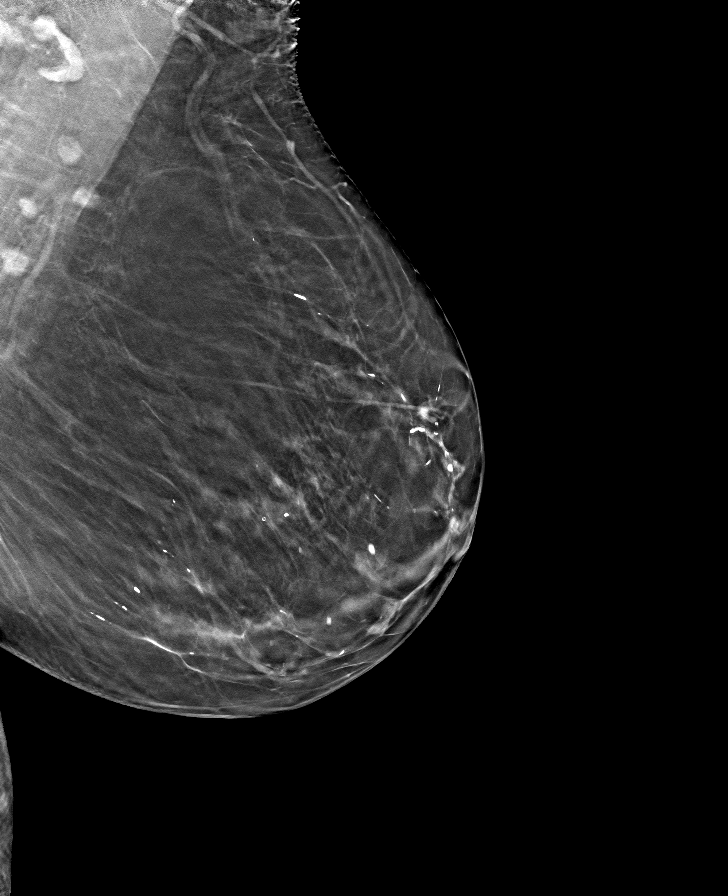

[R MLO tomo · tomo slice 31/60.0]
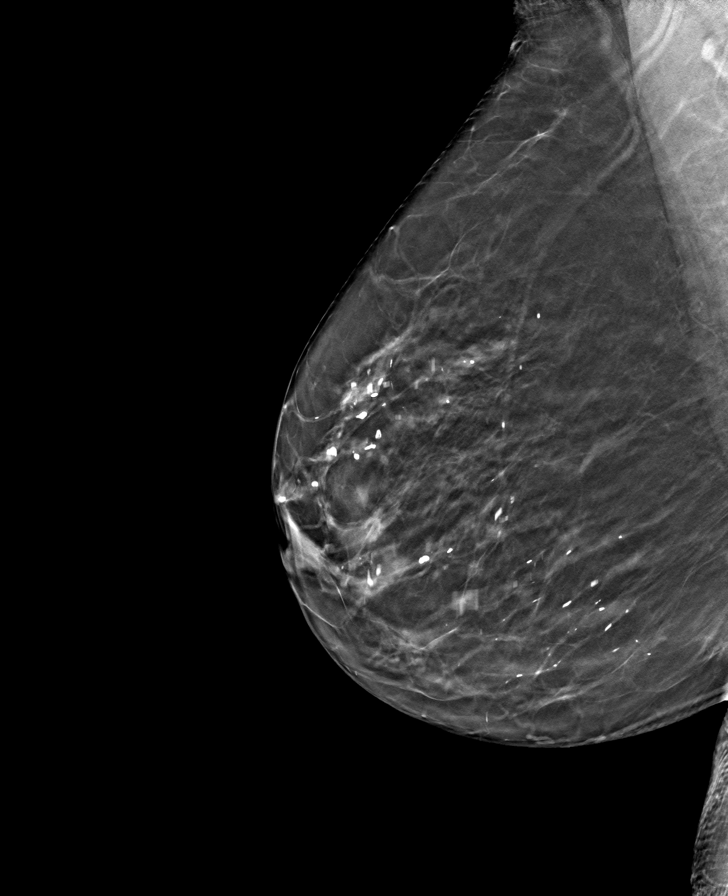

[L CC tomo · tomo slice 30/59.0]
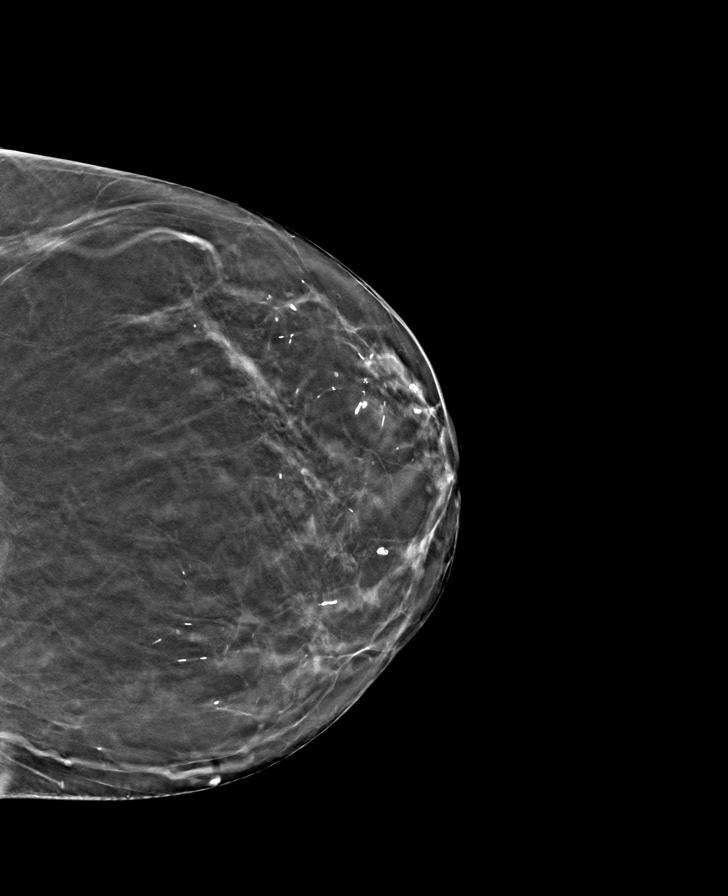

[8 of 24 positions shown; findings below may reference images not displayed]

ACR Breast Density Category b: There are scattered areas of
fibroglandular density.
FINDINGS: There are no findings suspicious for malignancy. Images were
processed with CAD.
IMPRESSION: No mammographic evidence of malignancy. A result letter of this
screening mammogram will be mailed directly to the patient.

RECOMMENDATION:
Screening mammogram in one year. (Code:[TQ])

BI-RADS CATEGORY  1: Negative.

## 2020-05-24 DIAGNOSIS — G629 Polyneuropathy, unspecified: Secondary | ICD-10-CM | POA: Insufficient documentation

## 2020-06-21 ENCOUNTER — Telehealth: Payer: Self-pay

## 2020-06-21 ENCOUNTER — Ambulatory Visit (INDEPENDENT_AMBULATORY_CARE_PROVIDER_SITE_OTHER): Payer: Medicare Other

## 2020-06-21 ENCOUNTER — Other Ambulatory Visit: Payer: Self-pay | Admitting: Internal Medicine

## 2020-06-21 ENCOUNTER — Other Ambulatory Visit: Payer: Self-pay

## 2020-06-21 VITALS — Ht 65.0 in | Wt 155.0 lb

## 2020-06-21 DIAGNOSIS — E039 Hypothyroidism, unspecified: Secondary | ICD-10-CM

## 2020-06-21 DIAGNOSIS — Z0000 Encounter for general adult medical examination without abnormal findings: Secondary | ICD-10-CM

## 2020-06-21 DIAGNOSIS — Z760 Encounter for issue of repeat prescription: Secondary | ICD-10-CM

## 2020-06-21 DIAGNOSIS — I10 Essential (primary) hypertension: Secondary | ICD-10-CM

## 2020-06-21 DIAGNOSIS — M5416 Radiculopathy, lumbar region: Secondary | ICD-10-CM

## 2020-06-21 MED ORDER — GABAPENTIN 100 MG PO CAPS: 300.0000 mg | ORAL_CAPSULE | Freq: Three times a day (TID) | ORAL | 5 refills | Status: DC

## 2020-06-21 MED ORDER — LEVOTHYROXINE SODIUM 50 MCG PO TABS: 50.0000 ug | ORAL_TABLET | Freq: Every day | ORAL | 3 refills | Status: DC

## 2020-06-21 MED ORDER — AMLODIPINE BESYLATE 2.5 MG PO TABS
2.5000 mg | ORAL_TABLET | Freq: Every day | ORAL | 3 refills | Status: DC
Start: 2020-06-21 — End: 2021-05-25

## 2020-06-21 NOTE — Telephone Encounter (Signed)
Patient made aware and verbalizes understanding.

## 2020-06-21 NOTE — Telephone Encounter (Signed)
Follow CDC guidelines and watch the news to see booster so far only 4 shots discussed and rec and 4th shot 6 months after 3rd  Watch news daily for updates Titer is + not sure about her question this is all new and still much to learn  Still needs to use caution social distance and wear a mask

## 2020-06-21 NOTE — Patient Instructions (Addendum)
Tonya Green , Thank you for taking time to come for your Medicare Wellness Visit. I appreciate your ongoing commitment to your health goals. Please review the following plan we discussed and let me know if I can assist you in the future.   These are the goals we discussed: Goals    . Maintain Healthy Lifestyle     Stay active Healthy diet       This is a list of the screening recommended for you and due dates:  Health Maintenance  Topic Date Due  . Pneumonia vaccines (1 of 2 - PCV13) 06/21/2021*  . Mammogram  04/26/2022  . Tetanus Vaccine  06/28/2025  . Colon Cancer Screening  10/14/2026  . Flu Shot  Completed  . DEXA scan (bone density measurement)  Completed  . COVID-19 Vaccine  Completed  .  Hepatitis C: One time screening is recommended by Center for Disease Control  (CDC) for  adults born from [birth year] through 1966.   Completed  *Topic was postponed. The date shown is not the original due date.    Immunizations Immunization History  Administered Date(s) Administered  . Influenza, High Dose Seasonal PF 03/18/2018, 02/01/2019, 02/18/2020  . PFIZER(Purple Top)SARS-COV-2 Vaccination 06/03/2019, 07/01/2019, 01/17/2020  . Tdap 06/29/2015  . Zoster Recombinat (Shingrix) 06/30/2018, 10/06/2018, 11/06/2018   Keep all routine maintenance appointments.   Follow up 11/23/20 @ 11:00  Advanced directives: on file  Conditions/risks identified: none new   Follow up in one year for your annual wellness visit.   Preventive Care 38 Years and Older, Female Preventive care refers to lifestyle choices and visits with your health care provider that can promote health and wellness. What does preventive care include?  A yearly physical exam. This is also called an annual well check.  Dental exams once or twice a year.  Routine eye exams. Ask your health care provider how often you should have your eyes checked.  Personal lifestyle choices, including:  Daily care of your teeth and  gums.  Regular physical activity.  Eating a healthy diet.  Avoiding tobacco and drug use.  Limiting alcohol use.  Practicing safe sex.  Taking low-dose aspirin every day.  Taking vitamin and mineral supplements as recommended by your health care provider. What happens during an annual well check? The services and screenings done by your health care provider during your annual well check will depend on your age, overall health, lifestyle risk factors, and family history of disease. Counseling  Your health care provider may ask you questions about your:  Alcohol use.  Tobacco use.  Drug use.  Emotional well-being.  Home and relationship well-being.  Sexual activity.  Eating habits.  History of falls.  Memory and ability to understand (cognition).  Work and work Statistician.  Reproductive health. Screening  You may have the following tests or measurements:  Height, weight, and BMI.  Blood pressure.  Lipid and cholesterol levels. These may be checked every 5 years, or more frequently if you are over 51 years old.  Skin check.  Lung cancer screening. You may have this screening every year starting at age 23 if you have a 30-pack-year history of smoking and currently smoke or have quit within the past 15 years.  Fecal occult blood test (FOBT) of the stool. You may have this test every year starting at age 34.  Flexible sigmoidoscopy or colonoscopy. You may have a sigmoidoscopy every 5 years or a colonoscopy every 10 years starting at age 25.  Hepatitis C  blood test.  Hepatitis B blood test.  Sexually transmitted disease (STD) testing.  Diabetes screening. This is done by checking your blood sugar (glucose) after you have not eaten for a while (fasting). You may have this done every 1-3 years.  Bone density scan. This is done to screen for osteoporosis. You may have this done starting at age 79.  Mammogram. This may be done every 1-2 years. Talk to your  health care provider about how often you should have regular mammograms. Talk with your health care provider about your test results, treatment options, and if necessary, the need for more tests. Vaccines  Your health care provider may recommend certain vaccines, such as:  Influenza vaccine. This is recommended every year.  Tetanus, diphtheria, and acellular pertussis (Tdap, Td) vaccine. You may need a Td booster every 10 years.  Zoster vaccine. You may need this after age 57.  Pneumococcal 13-valent conjugate (PCV13) vaccine. One dose is recommended after age 77.  Pneumococcal polysaccharide (PPSV23) vaccine. One dose is recommended after age 45. Talk to your health care provider about which screenings and vaccines you need and how often you need them. This information is not intended to replace advice given to you by your health care provider. Make sure you discuss any questions you have with your health care provider. Document Released: 06/10/2015 Document Revised: 02/01/2016 Document Reviewed: 03/15/2015 Elsevier Interactive Patient Education  2017 [place] Prevention in the Home Falls can cause injuries. They can happen to people of all ages. There are many things you can do to make your home safe and to help prevent falls. What can I do on the outside of my home?  Regularly fix the edges of walkways and driveways and fix any cracks.  Remove anything that might make you trip as you walk through a door, such as a raised step or threshold.  Trim any bushes or trees on the path to your home.  Use bright outdoor lighting.  Clear any walking paths of anything that might make someone trip, such as rocks or tools.  Regularly check to see if handrails are loose or broken. Make sure that both sides of any steps have handrails.  Any raised decks and porches should have guardrails on the edges.  Have any leaves, snow, or ice cleared regularly.  Use sand or salt on walking  paths during winter.  Clean up any spills in your garage right away. This includes oil or grease spills. What can I do in the bathroom?  Use night lights.  Install grab bars by the toilet and in the tub and shower. Do not use towel bars as grab bars.  Use non-skid mats or decals in the tub or shower.  If you need to sit down in the shower, use a plastic, non-slip stool.  Keep the floor dry. Clean up any water that spills on the floor as soon as it happens.  Remove soap buildup in the tub or shower regularly.  Attach bath mats securely with double-sided non-slip rug tape.  Do not have throw rugs and other things on the floor that can make you trip. What can I do in the bedroom?  Use night lights.  Make sure that you have a light by your bed that is easy to reach.  Do not use any sheets or blankets that are too big for your bed. They should not hang down onto the floor.  Have a firm chair that has side arms. You  can use this for support while you get dressed.  Do not have throw rugs and other things on the floor that can make you trip. What can I do in the kitchen?  Clean up any spills right away.  Avoid walking on wet floors.  Keep items that you use a lot in easy-to-reach places.  If you need to reach something above you, use a strong step stool that has a grab bar.  Keep electrical cords out of the way.  Do not use floor polish or wax that makes floors slippery. If you must use wax, use non-skid floor wax.  Do not have throw rugs and other things on the floor that can make you trip. What can I do with my stairs?  Do not leave any items on the stairs.  Make sure that there are handrails on both sides of the stairs and use them. Fix handrails that are broken or loose. Make sure that handrails are as long as the stairways.  Check any carpeting to make sure that it is firmly attached to the stairs. Fix any carpet that is loose or worn.  Avoid having throw rugs at  the top or bottom of the stairs. If you do have throw rugs, attach them to the floor with carpet tape.  Make sure that you have a light switch at the top of the stairs and the bottom of the stairs. If you do not have them, ask someone to add them for you. What else can I do to help prevent falls?  Wear shoes that:  Do not have high heels.  Have rubber bottoms.  Are comfortable and fit you well.  Are closed at the toe. Do not wear sandals.  If you use a stepladder:  Make sure that it is fully opened. Do not climb a closed stepladder.  Make sure that both sides of the stepladder are locked into place.  Ask someone to hold it for you, if possible.  Clearly mark and make sure that you can see:  Any grab bars or handrails.  First and last steps.  Where the edge of each step is.  Use tools that help you move around (mobility aids) if they are needed. These include:  Canes.  Walkers.  Scooters.  Crutches.  Turn on the lights when you go into a dark area. Replace any light bulbs as soon as they burn out.  Set up your furniture so you have a clear path. Avoid moving your furniture around.  If any of your floors are uneven, fix them.  If there are any pets around you, be aware of where they are.  Review your medicines with your doctor. Some medicines can make you feel dizzy. This can increase your chance of falling. Ask your doctor what other things that you can do to help prevent falls. This information is not intended to replace advice given to you by your health care provider. Make sure you discuss any questions you have with your health care provider. Document Released: 03/10/2009 Document Revised: 10/20/2015 Document Reviewed: 06/18/2014 Elsevier Interactive Patient Education  2017 [employer].

## 2020-06-21 NOTE — Telephone Encounter (Signed)
Patient has concerns of substantial tither value. She has had both covid vaccine with booster. After antibody testing she wonders if her tither value of [phone] is sufficient to keep her healthy or is it too low which would make her a candidate for the next booster available. How much does it drop and in what timeframe? Deferred to pcp for follow up.

## 2020-06-21 NOTE — Progress Notes (Signed)
Subjective:   Tonya Green is a 70 y.o. female who presents for an Initial Medicare Annual Wellness Visit.  Review of Systems    No ROS.  Medicare Wellness Virtual Visit.    Cardiac Risk Factors include: advanced age (>26men, >66 women);hypertension     Objective:    Today's Vitals   06/21/20 1311  Weight: 155 lb (70.3 kg)  Height: 5\' 5"  (1.651 m)   Body mass index is 25.79 kg/m.  Advanced Directives 06/21/2020 06/19/2019 11/15/2018 07/23/2018 06/17/2018  Does Patient Have a Medical Advance Directive? Yes Yes Yes Yes Yes  Type of Paramedic of [place];Living will Out of facility DNR (pink MOST or yellow form) - - Press photographer;Living will  Does patient want to make changes to medical advance directive? No - Patient declined No - Patient declined - - No - Patient declined  Copy of [place] in Chart? Yes - validated most recent copy scanned in chart (See row information) Yes - validated most recent copy scanned in chart (See row information) - - Yes - validated most recent copy scanned in chart (See row information)    Current Medications (verified) Outpatient Encounter Medications as of 06/21/2020  Medication Sig  . ALPHA LIPOIC ACID PO Take 250 mg by mouth.  Marland Kitchen amLODipine (NORVASC) 2.5 MG tablet Take 1 tablet (2.5 mg total) by mouth daily. In am if BP>140/?90 take another tablet  . Ascorbic Acid (VITAMIN C) 1000 MG tablet Take 1,000 mg by mouth 3 (three) times daily.  . Cholecalciferol (VITAMIN D3) 5000 units CAPS Take by mouth.  . diazepam (VALIUM) 2 MG tablet 1/2 pill to 1 pill qhs prn  . gabapentin (NEURONTIN) 100 MG capsule Take 3 capsules (300 mg total) by mouth 3 (three) times daily.  Marland Kitchen HYDROcodone-acetaminophen (NORCO/VICODIN) 5-325 MG tablet Take 1 tablet by mouth 2 (two) times daily as needed (only for flare ups).  Marland Kitchen levothyroxine (SYNTHROID) 50 MCG tablet Take 1 tablet (50 mcg total) by mouth daily before breakfast.   . MAGNESIUM MALATE PO Take 625 mg by mouth.  . Nattokinase 100 MG CAPS Take 100 mg by mouth daily.  . NON FORMULARY [place] 500 mg qid  nattokinase 100 mg qd  . NON FORMULARY Bergamot orange extract 500mg / tocotricenol and tocophile complex 100mg   . Strontium Chloride POWD Use as directed 500 mg in the mouth or throat.   Marland Kitchen UNABLE TO FIND Med Name: Omega Q plus Max calamarine   No facility-administered encounter medications on file as of 06/21/2020.    Allergies (verified) Macrobid [nitrofurantoin macrocrystal] and Sulfa antibiotics   History: Past Medical History:  Diagnosis Date  . Basal cell carcinoma    left nasal sidewall [facility]  . Carotid artery stenosis    mild L>R 03/26/16 [place]   . Diverticulosis    colonoscopy 10/13/16 [place]  . Factor 5 Leiden mutation, heterozygous ([place])   . Fatty liver   . History of blood transfusion    birth Rh factor   . Hyperlipidemia   . Hypertension   . OSA on CPAP   . Radiculopathy   . Right leg DVT ([place])    2010 s/p back surgery was on Coumadin x 6 months   . Thyroid disease    hypothyroidism    Past Surgical History:  Procedure Laterality Date  . APPENDECTOMY     2000  . BREAST EXCISIONAL BIOPSY Left 2005   for infection  .  BREAST SURGERY     biospy ? year   . MOHS SURGERY  05/11/2019   left nasal side wall bcc Dr. Jeannine Boga in [place]   . ruptured disc repair     L4/5 10/2008 with complications residual right leg weakness/reduced sensation and DVT in 2010 post surgery    Family History  Problem Relation Age of Onset  . Hypertension Mother   . Stroke Mother        in 75s   . Heart disease Father   . Stroke Father   . Cancer Brother        esophageal cancer   . Stroke Paternal Grandfather   . Hyperlipidemia Other   . Breast cancer Paternal Aunt    Social History   Socioeconomic History  . Marital status: Married    Spouse name: Not on file  . Number of children: Not on file  .  Years of education: Not on file  . Highest education level: Not on file  Occupational History  . Not on file  Tobacco Use  . Smoking status: Never Smoker  . Smokeless tobacco: Never Used  Vaping Use  . Vaping Use: Never used  Substance and Sexual Activity  . Alcohol use: Yes    Comment: occ  . Drug use: Never  . Sexual activity: Not on file  Other Topics Concern  . Not on file  Social History Narrative   BSN CCM, RN   Moved from [place] recently    2 daughters    Retired and married    No guns, wears seat belt, safe in relationship    Lives at [employer]    Social Determinants of Health   Financial Resource Strain: Low Risk   . Difficulty of Paying Living Expenses: Not hard at all  Food Insecurity: No Food Insecurity  . Worried About Programme researcher, broadcasting/film/video in the Last Year: Never true  . Ran Out of Food in the Last Year: Never true  Transportation Needs: No Transportation Needs  . Lack of Transportation (Medical): No  . Lack of Transportation (Non-Medical): No  Physical Activity: Sufficiently Active  . Days of Exercise per Week: 7 days  . Minutes of Exercise per Session: 60 min  Stress: No Stress Concern Present  . Feeling of Stress : Not at all  Social Connections: Unknown  . Frequency of Communication with Friends and Family: More than three times a week  . Frequency of Social Gatherings with Friends and Family: Not on file  . Attends Religious Services: Not on file  . Active Member of Clubs or Organizations: Not on file  . Attends Banker Meetings: Not on file  . Marital Status: Married    Tobacco Counseling Counseling given: Not Answered   Clinical Intake:  Pre-visit preparation completed: Yes        Diabetes: No  How often do you need to have someone help you when you read instructions, pamphlets, or other written materials from your doctor or pharmacy?: 1 - Never   Interpreter Needed?: No      Activities of Daily Living In your  present state of health, do you have any difficulty performing the following activities: 06/21/2020  Hearing? N  Vision? N  Difficulty concentrating or making decisions? N  Walking or climbing stairs? Y  Dressing or bathing? N  Doing errands, shopping? N  Preparing Food and eating ? N  Using the Toilet? N  In the past six months, have you  accidently leaked urine? N  Do you have problems with loss of bowel control? N  Managing your Medications? N  Managing your Finances? N  Housekeeping or managing your Housekeeping? N  Some recent data might be hidden    Patient Care Team: McLean-Scocuzza, Nino Glow, MD as PCP - General (Internal Medicine)  Indicate any recent Medical Services you may have received from other than [facility] providers in the past year (date may be approximate).     Assessment:   This is a routine wellness examination for Yuvia.  I connected with Mikhaela today by telephone and verified that I am speaking with the correct person using two identifiers. Location patient: home Location provider: work Persons participating in the virtual visit: patient, Marine scientist.    I discussed the limitations, risks, security and privacy concerns of performing an evaluation and management service by telephone and the availability of in person appointments. The patient expressed understanding and verbally consented to this telephonic visit.    Interactive audio and video telecommunications were attempted between this provider and patient, however failed, due to patient having technical difficulties OR patient did not have access to video capability.  We continued and completed visit with audio only.  Some vital signs may be absent or patient reported.   Hearing/Vision screen  Hearing Screening   125Hz  250Hz  500Hz  1000Hz  2000Hz  3000Hz  4000Hz  6000Hz  8000Hz   Right ear:           Left ear:           Comments: Patient is able to hear conversational tones without difficulty.  No issues  reported.  Vision Screening Comments: Visual acuity not assessed, virtual visit.  They have seen their ophthalmologist in the last 12 months.     Dietary issues and exercise activities discussed: Current Exercise Habits: Home exercise routine, Type of exercise: walking, Time (Minutes): 60, Frequency (Times/Week): 7, Weekly Exercise (Minutes/Week): 420, Intensity: Mild  Healthy diet Good water intake  Goals      Patient Stated   .  Increase physical activity (pt-stated)      Resume walking regimen      Depression Screen PHQ 2/9 Scores 06/21/2020 06/19/2019 06/17/2018 12/18/2017  PHQ - 2 Score 0 0 0 0    Fall Risk Fall Risk  06/21/2020 03/31/2020 09/22/2019 06/19/2019 06/17/2018  Falls in the past year? 0 1 0 0 0  Number falls in past yr: 0 0 - - -  Injury with Fall? 0 0 - - -  Risk for fall due to : - - - Post anesthesia -  Follow up Falls evaluation completed Falls evaluation completed - - -   TIMED UP AND GO: Was the test performed? No . Virtual visit.   Cognitive Function:  Patient is alert and oriented x3.  Denies difficulty focusing, making decisions, memory loss.  Enjoys online classes.  MMSE/6CIT deferred. Normal by direct communication/observation.    6CIT Screen 06/19/2019 06/17/2018  What Year? 0 points 0 points  What month? 0 points 0 points  What time? 0 points 0 points  Count back from 20 - 0 points  Months in reverse - 0 points  Repeat phrase - 0 points  Total Score - 0    Immunizations Immunization History  Administered Date(s) Administered  . Influenza, High Dose Seasonal PF 03/18/2018, 02/01/2019, 02/18/2020  . PFIZER(Purple Top)SARS-COV-2 Vaccination 06/03/2019, 07/01/2019, 01/17/2020  . Tdap 06/29/2015  . Zoster Recombinat (Shingrix) 06/30/2018, 10/06/2018, 11/06/2018   PNA vaccine- deferred at this time per patient  preference.   Health Maintenance Health Maintenance  Topic Date Due  . PNA vac Low Risk Adult (1 of 2 - PCV13) 06/21/2021  (Originally 01/28/2015)  . MAMMOGRAM  04/26/2022  . TETANUS/TDAP  06/28/2025  . COLONOSCOPY (Pts 45-69yrs Insurance coverage will need to be confirmed)  10/14/2026  . INFLUENZA VACCINE  Completed  . DEXA SCAN  Completed  . COVID-19 Vaccine  Completed  . Hepatitis C Screening  Completed   Colorectal cancer screening: Type of screening: Colonoscopy. Completed 05/25/11. Repeat every 10 years  Mammogram status: Completed 04/26/20. Repeat every year   Bone Density status: Completed 06/22/19. Results reflect: Bone density results: OSTEOPOROSIS. Repeat every 2 years.  Lung Cancer Screening: (Low Dose CT Chest recommended if Age 39-80 years, 30 pack-year currently smoking OR have quit w/in 15years.) does not qualify.   Hepatitis C Screening: Completed 01/10/17.  Vision Screening: Recommended annual ophthalmology exams for early detection of glaucoma and other disorders of the eye. Is the patient up to date with their annual eye exam?  Yes  Who is the provider or what is the name of the office in which the patient attends annual eye exams? [facility].   Dental Screening: Recommended annual dental exams for proper oral hygiene. Visits every 6-12 months.  Community Resource Referral / Chronic Care Management: CRR required this visit?  No   CCM required this visit?  No      Plan:   Keep all routine maintenance appointments.   Follow up 11/23/20 @ 11:00  Refill medications: gabapentin, amlodipine, levothyroxine. Pended for pcp approval.    Patient has concerns of substantial tither value. She has had both covid vaccine with booster. After antibody testing she wonders if her tither value of [phone] is sufficient to keep her healthy or is it too low which would make her a candidate for the next booster available. How much does it drop and in what timeframe? Deferred to pcp for follow up. Telephone note sent.  I have personally reviewed and noted the following in the patient's  chart:   . Medical and social history . Use of alcohol, tobacco or illicit drugs  . Current medications and supplements . Functional ability and status . Nutritional status . Physical activity . Advanced directives . List of other physicians . Hospitalizations, surgeries, and ER visits in previous 12 months . Vitals . Screenings to include cognitive, depression, and falls . Referrals and appointments  In addition, I have reviewed and discussed with patient certain preventive protocols, quality metrics, and best practice recommendations. A written personalized care plan for preventive services as well as general preventive health recommendations were provided to patient via mychart.     Varney Biles, LPN   [id number]

## 2020-08-05 ENCOUNTER — Emergency Department: Payer: Medicare Other

## 2020-08-05 ENCOUNTER — Emergency Department
Admission: EM | Admit: 2020-08-05 | Discharge: 2020-08-05 | Disposition: A | Discharge status: Home / Self Care | Payer: Medicare Other | Attending: Emergency Medicine | Admitting: Emergency Medicine

## 2020-08-05 ENCOUNTER — Other Ambulatory Visit: Payer: Self-pay

## 2020-08-05 DIAGNOSIS — R197 Diarrhea, unspecified: Secondary | ICD-10-CM | POA: Insufficient documentation

## 2020-08-05 DIAGNOSIS — Z20822 Contact with and (suspected) exposure to covid-19: Secondary | ICD-10-CM | POA: Diagnosis not present

## 2020-08-05 DIAGNOSIS — R0789 Other chest pain: Secondary | ICD-10-CM | POA: Insufficient documentation

## 2020-08-05 DIAGNOSIS — R110 Nausea: Secondary | ICD-10-CM | POA: Insufficient documentation

## 2020-08-05 DIAGNOSIS — Z79899 Other long term (current) drug therapy: Secondary | ICD-10-CM | POA: Insufficient documentation

## 2020-08-05 DIAGNOSIS — Z85828 Personal history of other malignant neoplasm of skin: Secondary | ICD-10-CM | POA: Insufficient documentation

## 2020-08-05 DIAGNOSIS — E039 Hypothyroidism, unspecified: Secondary | ICD-10-CM | POA: Insufficient documentation

## 2020-08-05 DIAGNOSIS — R0602 Shortness of breath: Secondary | ICD-10-CM | POA: Diagnosis not present

## 2020-08-05 DIAGNOSIS — R42 Dizziness and giddiness: Secondary | ICD-10-CM | POA: Diagnosis present

## 2020-08-05 DIAGNOSIS — I10 Essential (primary) hypertension: Secondary | ICD-10-CM | POA: Insufficient documentation

## 2020-08-05 DIAGNOSIS — R079 Chest pain, unspecified: Secondary | ICD-10-CM

## 2020-08-05 LAB — CBC
HCT: 39.8 % (ref 36.0–46.0)
Hemoglobin: 13.3 g/dL (ref 12.0–15.0)
MCH: 31.1 pg (ref 26.0–34.0)
MCHC: 33.4 g/dL (ref 30.0–36.0)
MCV: 93 fL (ref 80.0–100.0)
Platelets: 271 10*3/uL (ref 150–400)
RBC: 4.28 MIL/uL (ref 3.87–5.11)
RDW: 12.6 % (ref 11.5–15.5)
WBC: 6.9 10*3/uL (ref 4.0–10.5)
nRBC: 0 % (ref 0.0–0.2)

## 2020-08-05 LAB — RESP PANEL BY RT-PCR (FLU A&B, COVID) ARPGX2
Influenza A by PCR: NEGATIVE
Influenza B by PCR: NEGATIVE
SARS Coronavirus 2 by RT PCR: NEGATIVE

## 2020-08-05 LAB — COMPREHENSIVE METABOLIC PANEL
ALT: 20 U/L (ref 0–44)
AST: 20 U/L (ref 15–41)
Albumin: 4.1 g/dL (ref 3.5–5.0)
Alkaline Phosphatase: 81 U/L (ref 38–126)
Anion gap: 8 (ref 5–15)
BUN: 16 mg/dL (ref 8–23)
CO2: 21 mmol/L — ABNORMAL LOW (ref 22–32)
Calcium: 9.1 mg/dL (ref 8.9–10.3)
Chloride: 108 mmol/L (ref 98–111)
Creatinine, Ser: 0.69 mg/dL (ref 0.44–1.00)
GFR, Estimated: >60 mL/min (ref >60)
Glucose, Bld: 112 mg/dL — ABNORMAL HIGH (ref 70–99)
Potassium: 3.8 mmol/L (ref 3.5–5.1)
Sodium: 137 mmol/L (ref 135–145)
Total Bilirubin: 0.6 mg/dL (ref 0.3–1.2)
Total Protein: 7.2 g/dL (ref 6.5–8.1)

## 2020-08-05 LAB — TROPONIN I (HIGH SENSITIVITY)
Troponin I (High Sensitivity): 3 ng/L (ref <18)
Troponin I (High Sensitivity): 3 ng/L (ref <18)

## 2020-08-05 LAB — URINALYSIS, ROUTINE W REFLEX MICROSCOPIC
Bilirubin Urine: NEGATIVE
Glucose, UA: NEGATIVE mg/dL
Hgb urine dipstick: NEGATIVE
Ketones, ur: NEGATIVE mg/dL
Leukocytes,Ua: NEGATIVE
Nitrite: NEGATIVE
Protein, ur: NEGATIVE mg/dL
Specific Gravity, Urine: 1.009 (ref 1.005–1.030)
pH: 7 (ref 5.0–8.0)

## 2020-08-05 IMAGING — MR MR HEAD W/O CM
12 series · 48 of 48 positions shown · non-contrast
Comparison: CT head [DATE]

CLINICAL DATA: Acute neuro deficit.  Dizziness.

EXAM:
MRI HEAD WITHOUT CONTRAST
TECHNIQUE: Multiplanar, multiecho pulse sequences of the brain and surrounding
structures were obtained without intravenous contrast.

[Series 5: ax dwi_tracew · axial · 3.0mm · 0.65mm/px · z∈[-113,+42]mm · 4 of 48 slices shown]
[im 1/48]
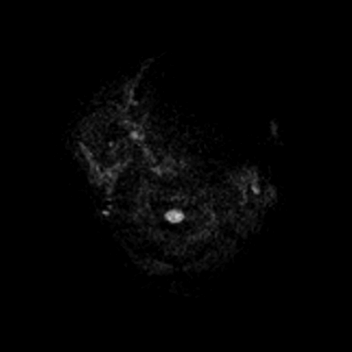
[im 16/48]
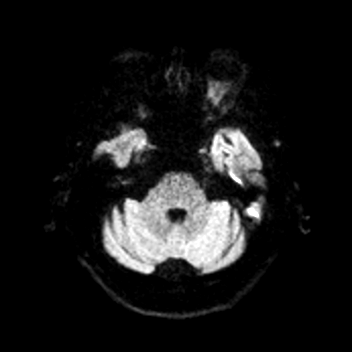
[im 32/48]
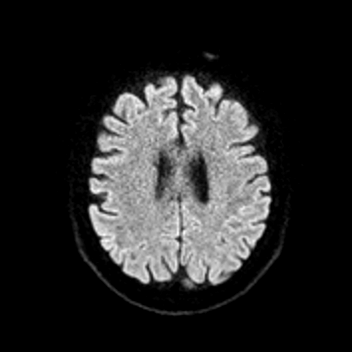
[im 48/48]
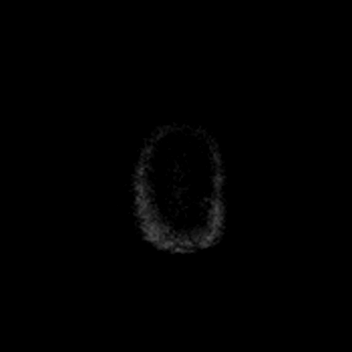

[Series 6: ax dwi_adc · axial · 3.0mm · 0.65mm/px · z∈[-113,+42]mm · 4 of 48 slices shown]
[im 1/48]
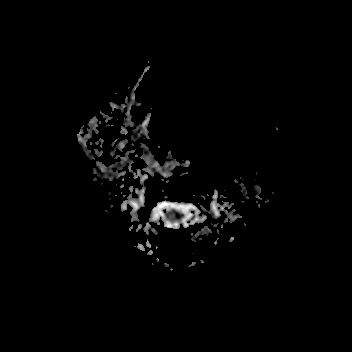
[im 16/48]
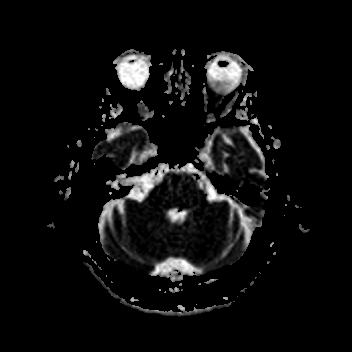
[im 32/48]
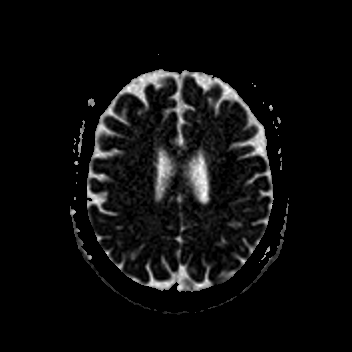
[im 48/48]
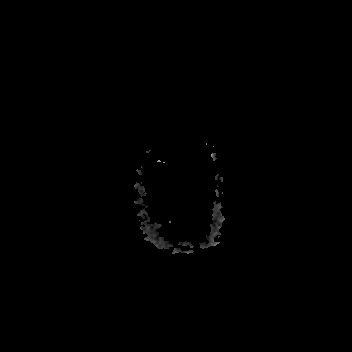

[Series 7: cor dwi_tracew · coronal · 5.0mm · 1.31mm/px · 3 of 38 slices shown]
[im 1/38]
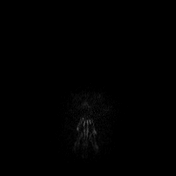
[im 19/38]
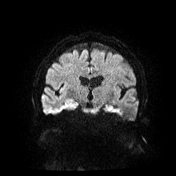
[im 38/38]
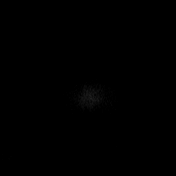

[Series 8: cor dwi_adc · coronal · 5.0mm · 1.31mm/px · 3 of 38 slices shown]
[im 1/38]
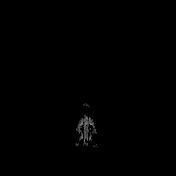
[im 19/38]
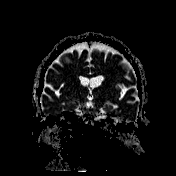
[im 38/38]
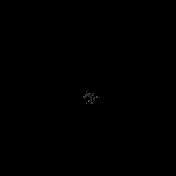

[Series 9: T1 · sagittal · 5.0mm · 0.62mm/px · 2 of 22 slices shown (1 of 2)]
[im 1/22]
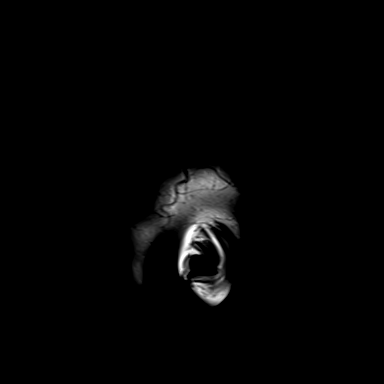
[im 22/22]
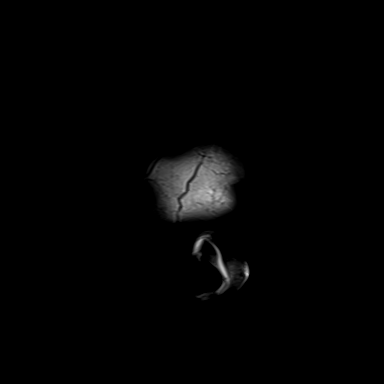

[Series 10: T2 · axial · 5.0mm · 0.53mm/px · z∈[-108,+36]mm · 2 of 25 slices shown (1 of 2)]
[im 1/25]
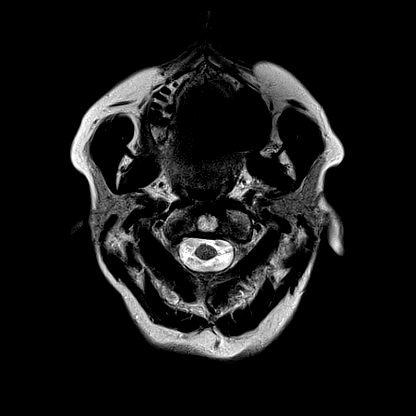
[im 25/25]
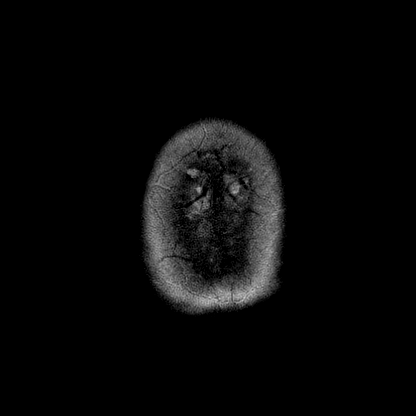

[Series 11: mag_images · axial · 3.0mm · 0.90mm/px · z∈[-117,+47]mm · 4 of 56 slices shown]
[im 1/56]
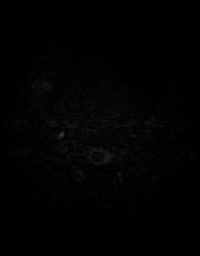
[im 19/56]
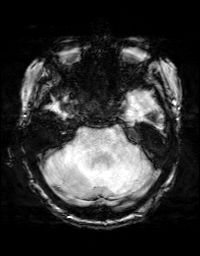
[im 37/56]
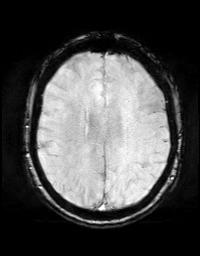
[im 56/56]
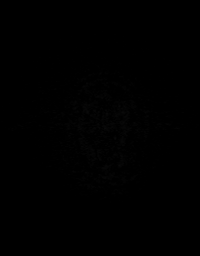

[Series 12: pha_images · axial · 3.0mm · 0.90mm/px · z∈[-117,+47]mm · 4 of 56 slices shown]
[im 1/56]
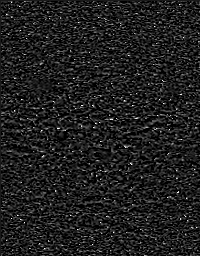
[im 19/56]
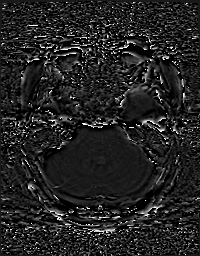
[im 37/56]
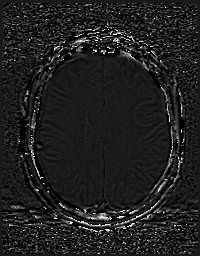
[im 56/56]
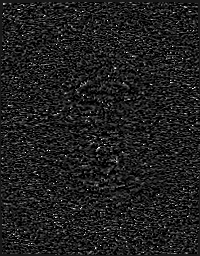

[Series 13: swi_images · axial · 3.0mm · 0.90mm/px · z∈[-117,+47]mm · 4 of 56 slices shown]
[im 1/56]
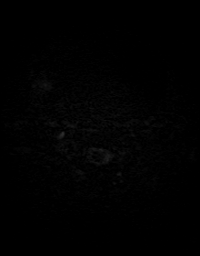
[im 19/56]
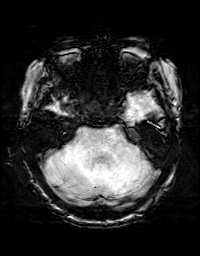
[im 37/56]
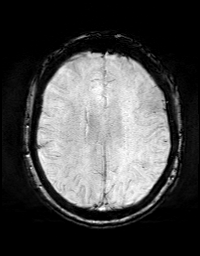
[im 56/56]
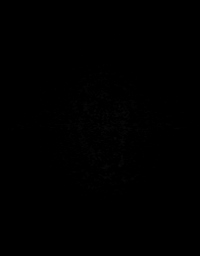

[Series 15: FLAIR · axial · 3.0mm · 0.53mm/px · z∈[-116,+46]mm · 4 of 55 slices shown]
[im 1/55]
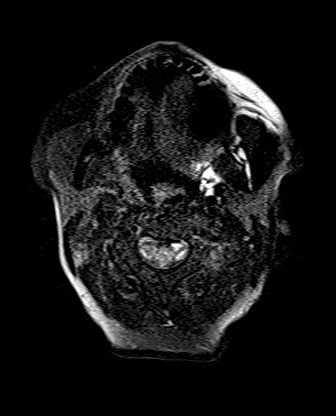
[im 19/55]
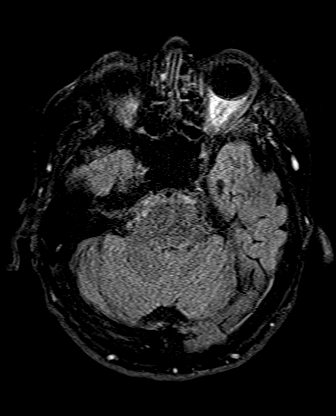
[im 37/55]
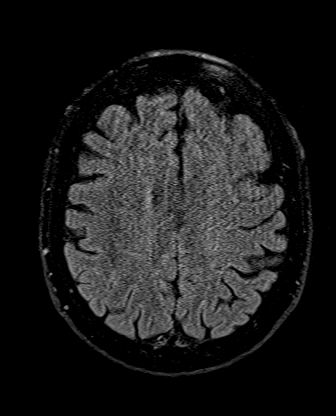
[im 55/55]
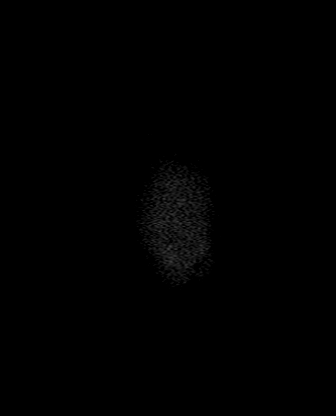

[Series 16: T1 · axial · 1.0mm · 0.98mm/px · z∈[-107,+52]mm · 12 of 160 slices shown (2 of 2)]
[im 1/160]
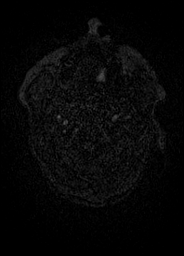
[im 15/160]
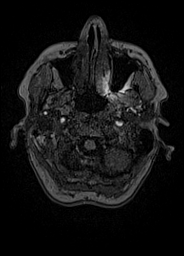
[im 29/160]
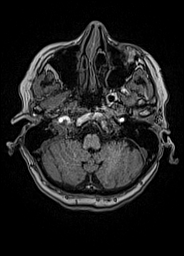
[im 44/160]
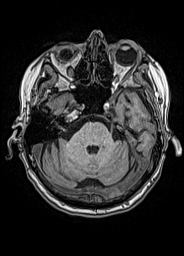
[im 58/160]
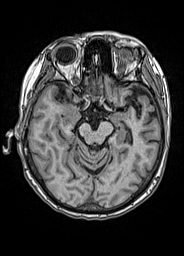
[im 73/160]
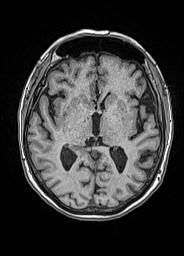
[im 87/160]
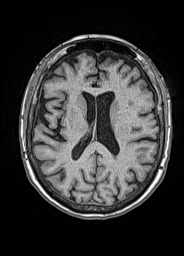
[im 102/160]
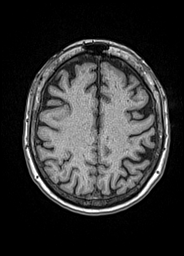
[im 116/160]
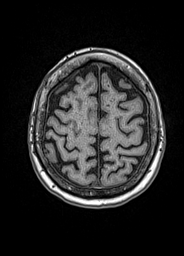
[im 131/160]
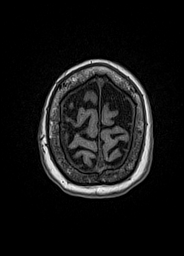
[im 145/160]
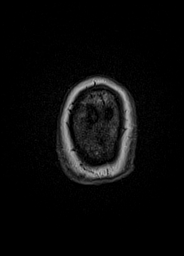
[im 160/160]
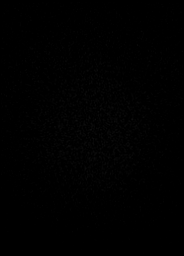

[Series 17: T2 · coronal · 5.0mm · 0.45mm/px · 2 of 29 slices shown (2 of 2)]
[im 1/29]
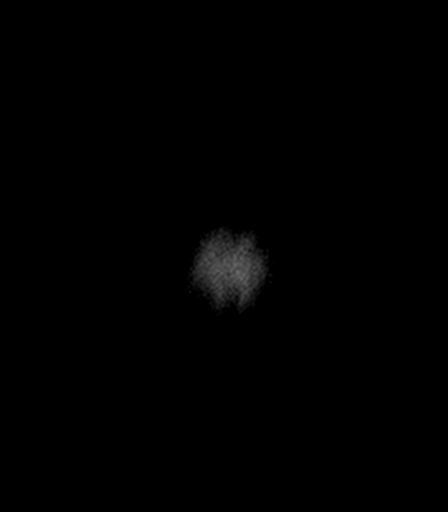
[im 29/29]
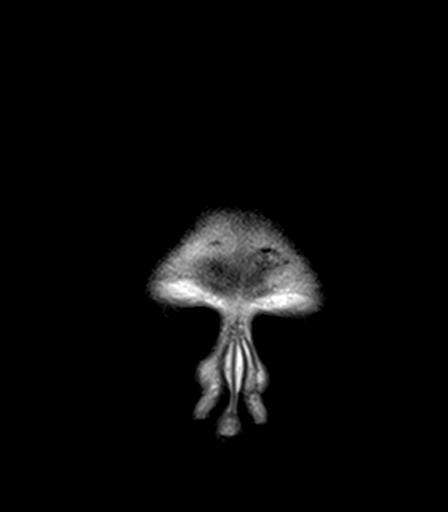

[48 of 48 positions shown; findings below may reference images not displayed]

FINDINGS: Brain: No acute infarction, hemorrhage, hydrocephalus, extra-axial
collection or mass lesion. Minimal white matter changes.

Vascular: Normal arterial flow voids

Skull and upper cervical spine: No focal skeletal abnormality.

Sinuses/Orbits: Paranasal sinuses clear.  Negative orbit

Other: None
IMPRESSION: No significant abnormality.  Negative for acute infarct.

## 2020-08-05 IMAGING — CT CT HEAD W/O CM
3 series · 16 of 47 positions shown, 19 images · non-contrast
Comparison: None.

CLINICAL DATA: Dizziness, nausea

EXAM:
CT HEAD WITHOUT CONTRAST
TECHNIQUE: Contiguous axial images were obtained from the base of the skull
through the vertex without intravenous contrast.

[Series 2: head wo · axial · 0.41mm/px · z∈[-142,-12]mm · 10 of 32 slices shown, 13 images]
[im 3/32  brain]
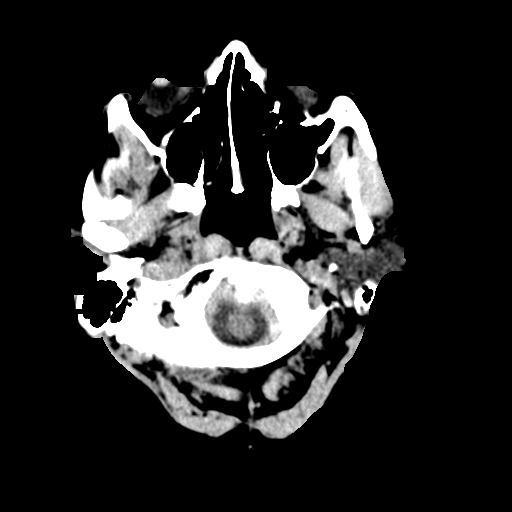
[im 3/32  bone]
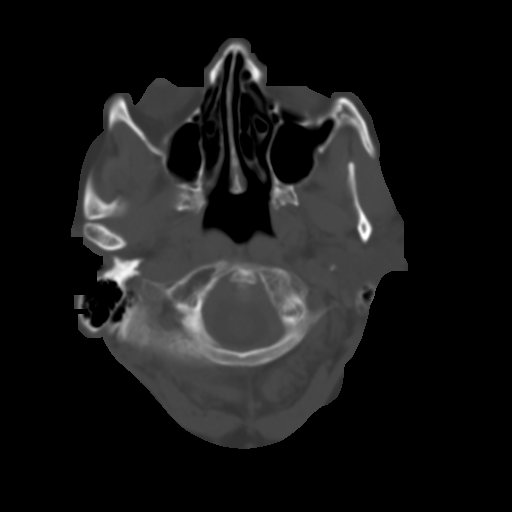
[im 6/32  brain]
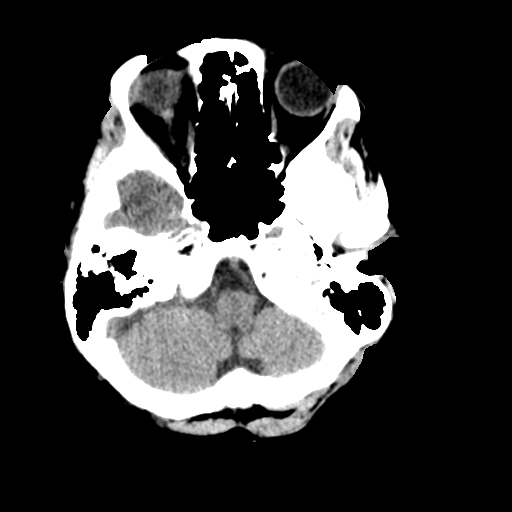
[im 9/32  brain]
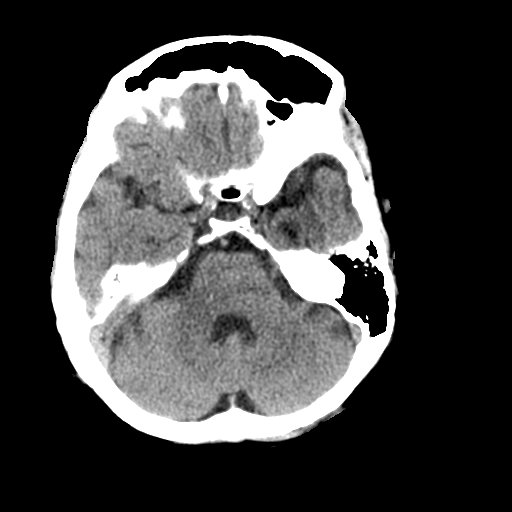
[im 11/32  brain]
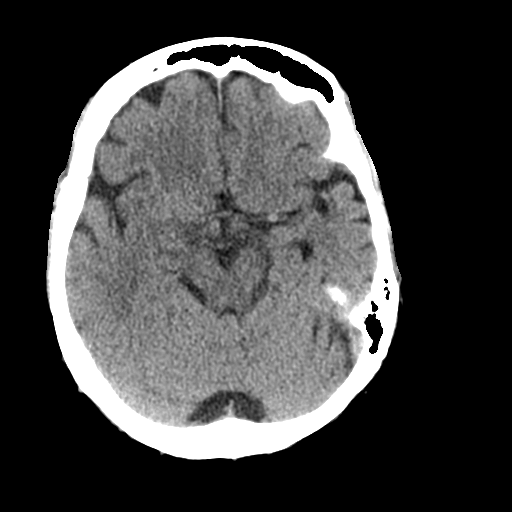
[im 14/32  brain]
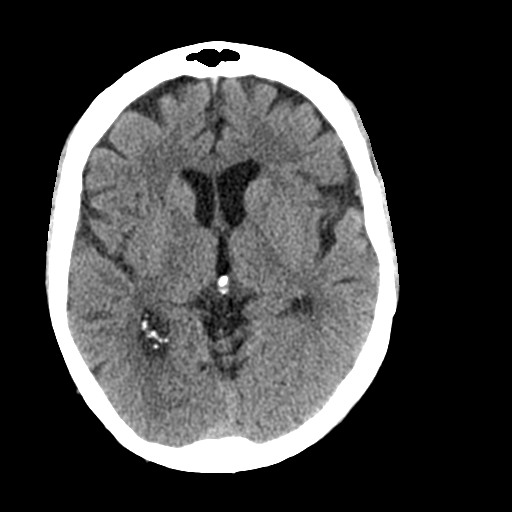
[im 14/32  bone]
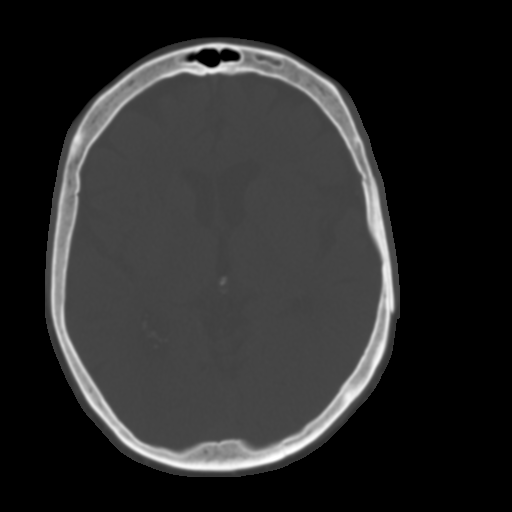
[im 18/32  brain]
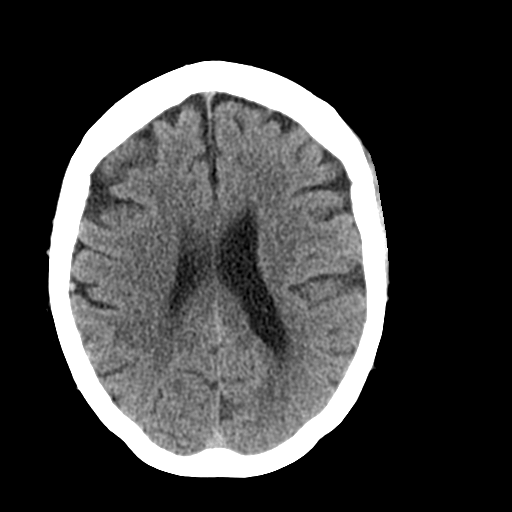
[im 21/32  brain]
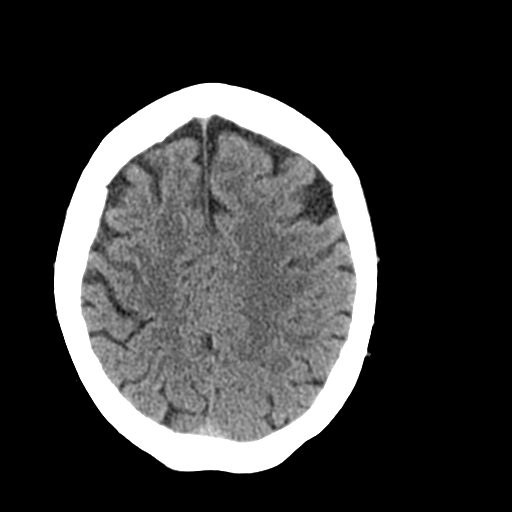
[im 24/32  brain]
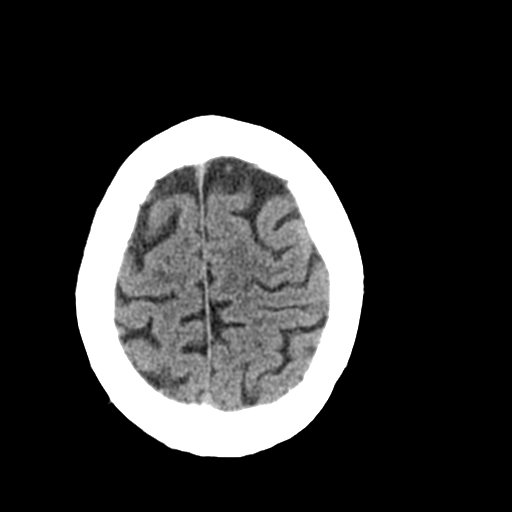
[im 26/32  brain]
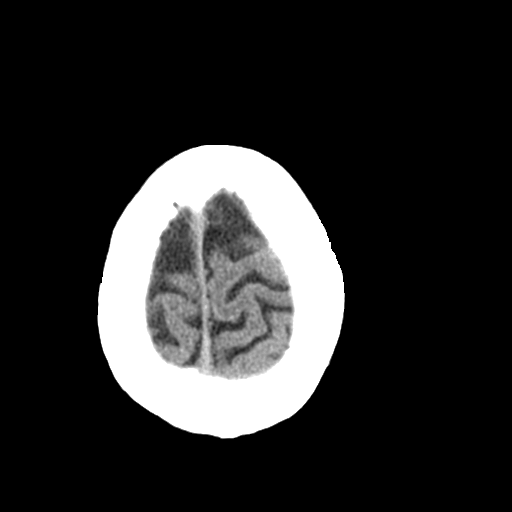
[im 26/32  bone]
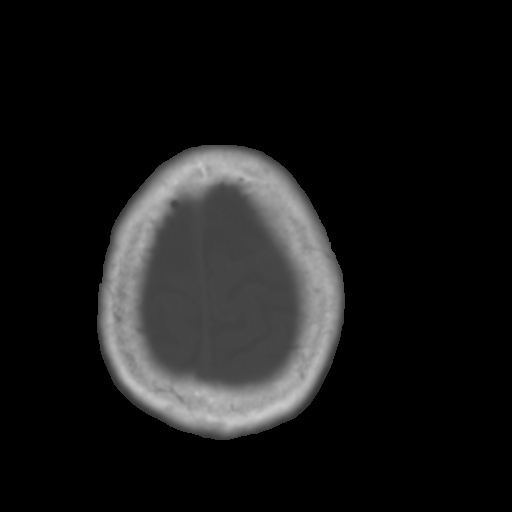
[im 29/32  brain]
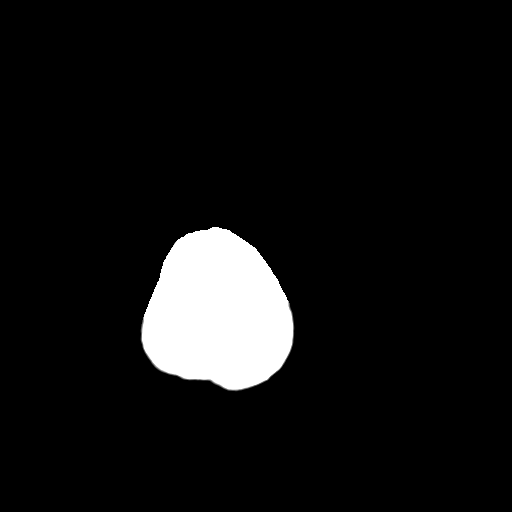

[Series 4: coronal soft tissue · coronal · 0.31mm/px · 3 of 62 slices shown]
[im 21/62  brain]
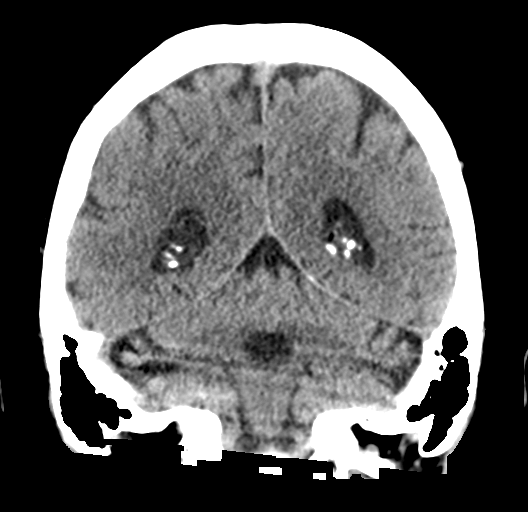
[im 28/62  brain]
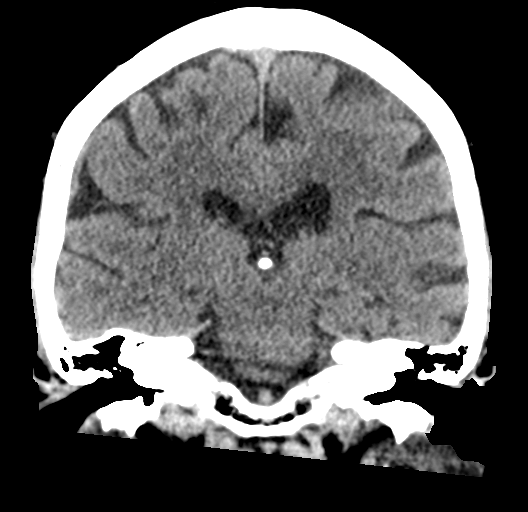
[im 34/62  brain]
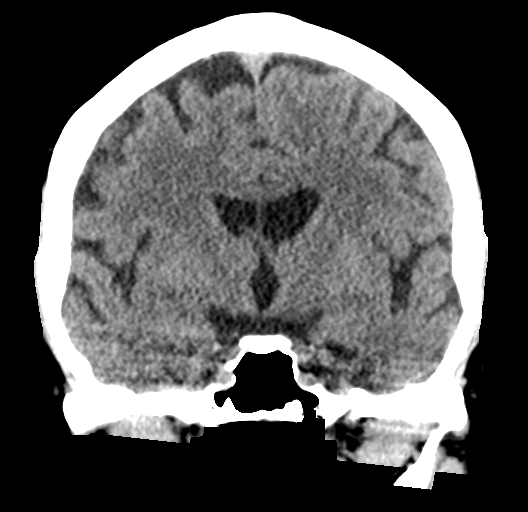

[Series 5: sagittal soft tissue · sagittal · 0.32mm/px · 3 of 55 slices shown]
[im 19/55  brain]
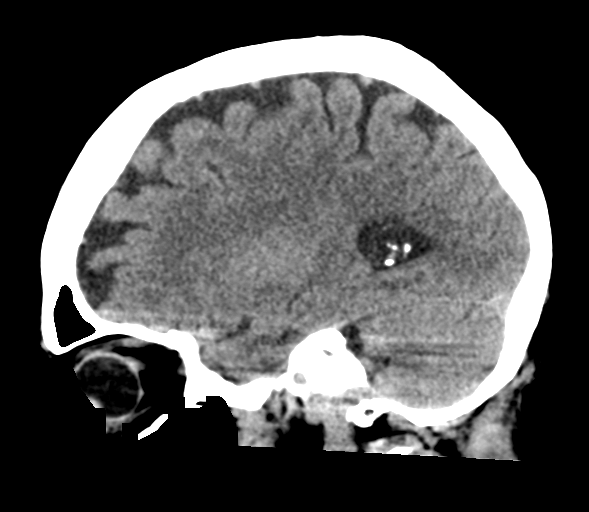
[im 28/55  brain]
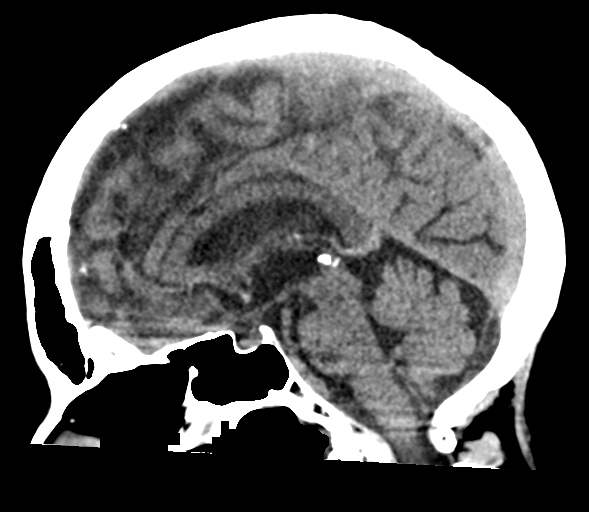
[im 37/55  brain]
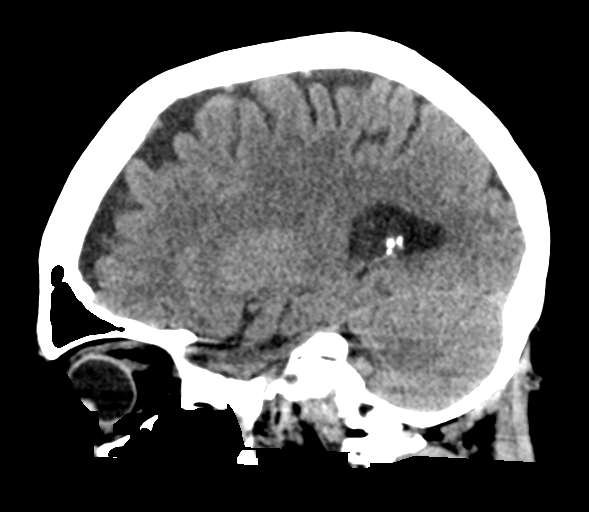

[16 of 47 positions shown; findings below may reference images not displayed]

FINDINGS: Brain: Normal anatomic configuration. No abnormal intra or
extra-axial mass lesion or fluid collection. No abnormal mass effect
or midline shift. No evidence of acute intracranial hemorrhage or
infarct. Ventricular size is normal. Cerebellum unremarkable.

Vascular: Unremarkable

Skull: Intact

Sinuses/Orbits: Paranasal sinuses are clear. Orbits are
unremarkable.

Other: Mastoid air cells and middle ear cavities are clear.
IMPRESSION: No acute intracranial abnormality.  Normal exam.

## 2020-08-05 IMAGING — CR DG CHEST 2V
2 series · 2 of 2 positions shown · non-contrast
Comparison: None.

CLINICAL DATA: Chest pain

EXAM:
CHEST - 2 VIEW

[chest lat]
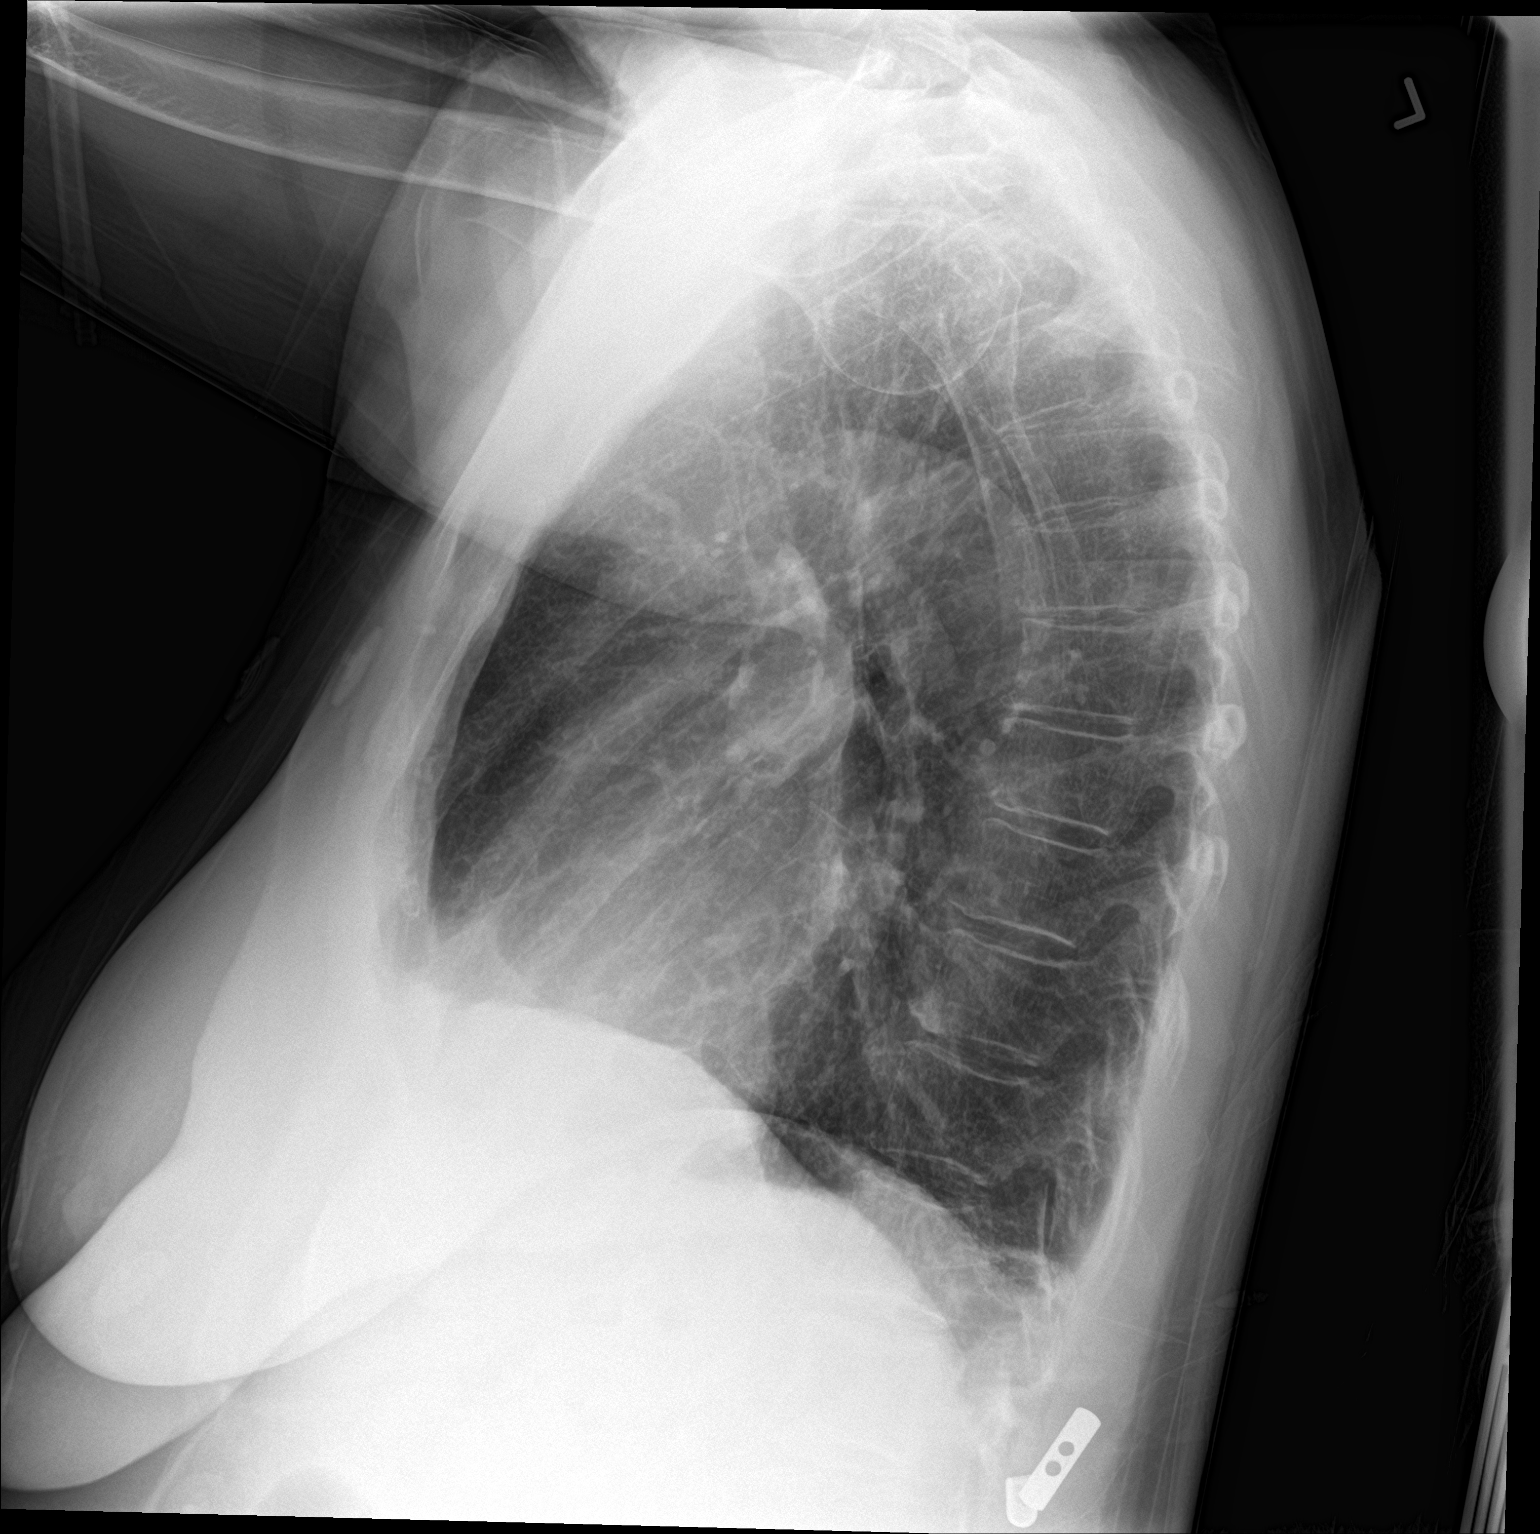

[chest ap]
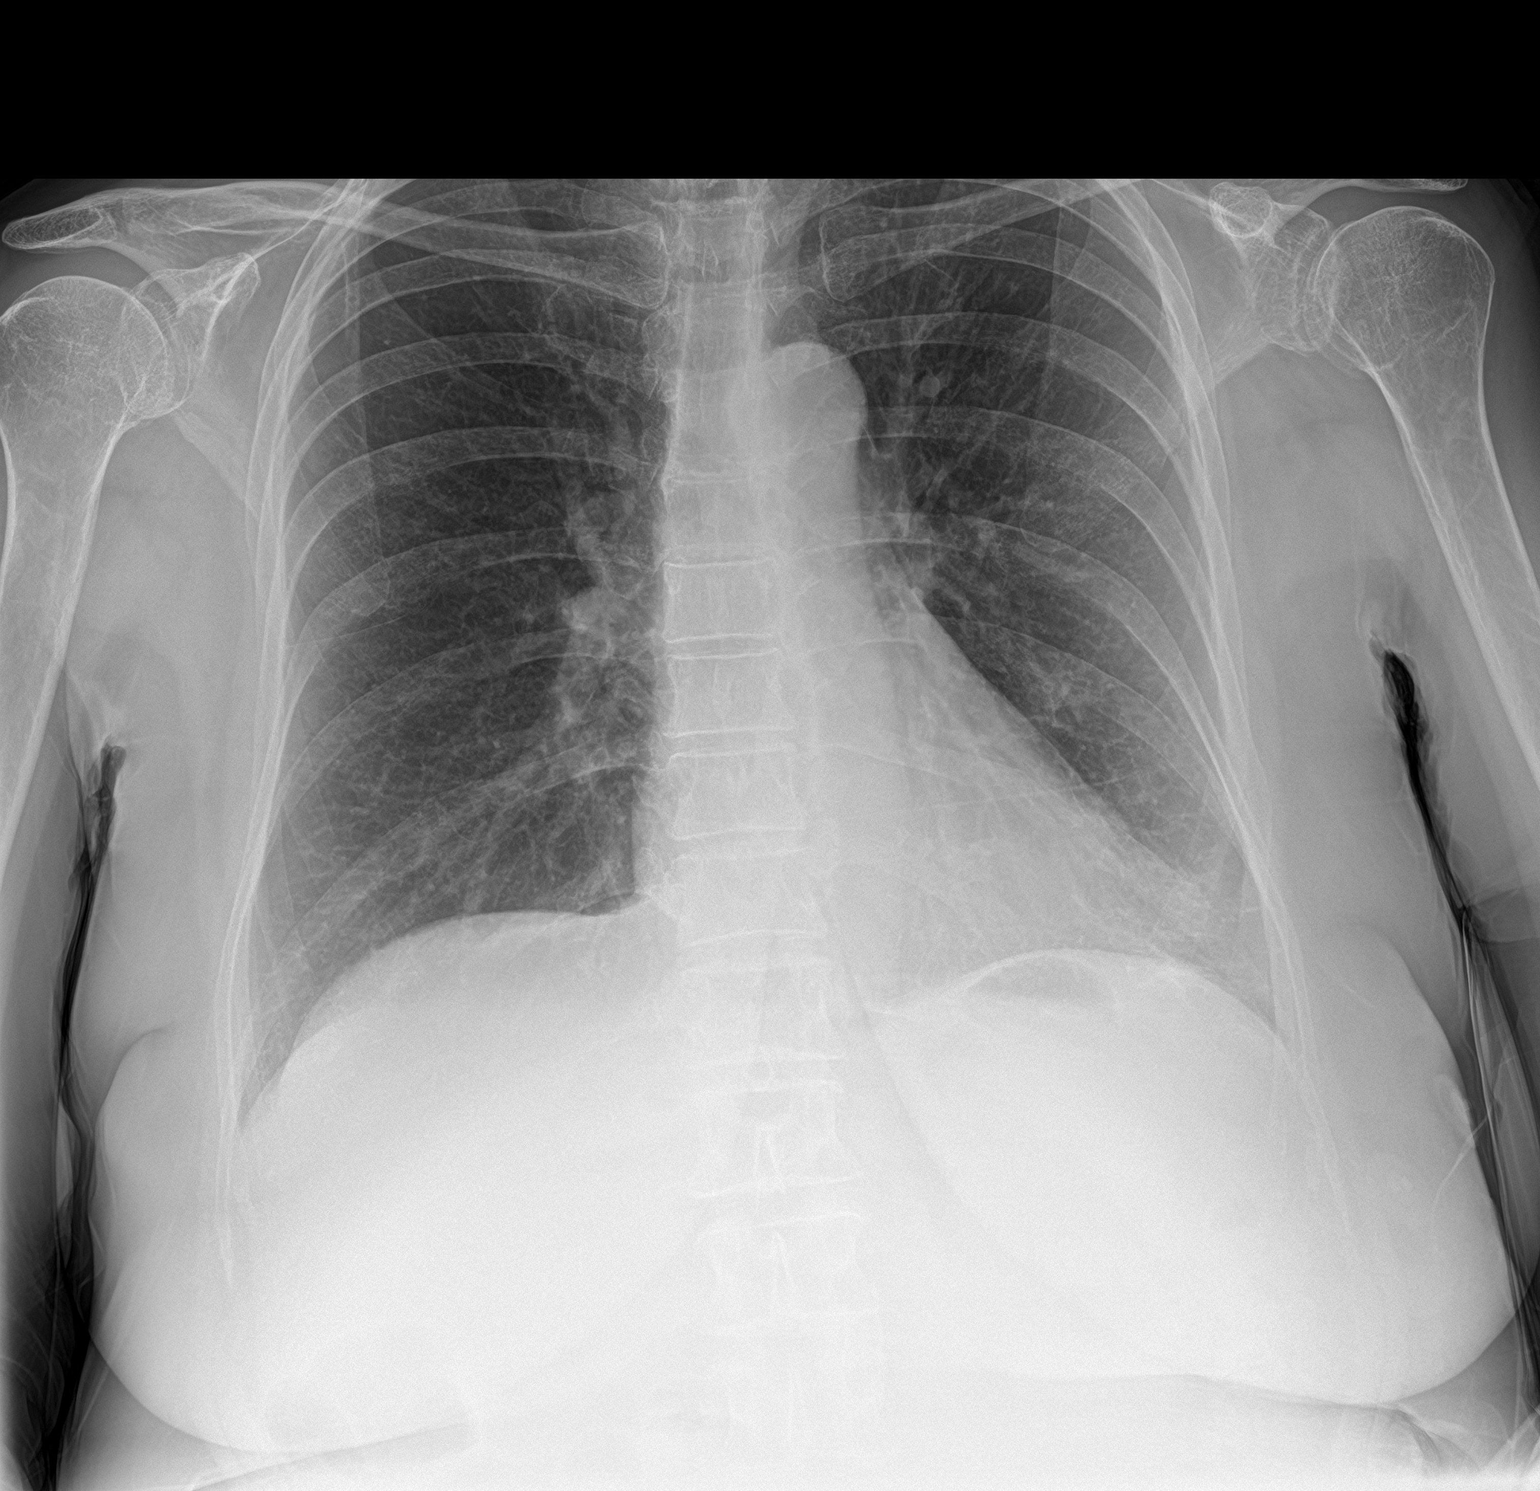

[2 of 2 positions shown; findings below may reference images not displayed]

FINDINGS: The heart size and mediastinal contours are within normal limits.
Both lungs are clear. The visualized skeletal structures are
unremarkable.
IMPRESSION: No active cardiopulmonary disease.

## 2020-08-05 MED ORDER — MECLIZINE HCL 25 MG PO TABS: 25.0000 mg | ORAL_TABLET | Freq: Three times a day (TID) | ORAL | 0 refills | Status: AC | PRN

## 2020-08-05 MED ORDER — ONDANSETRON HCL 4 MG PO TABS: 4.0000 mg | ORAL_TABLET | Freq: Three times a day (TID) | ORAL | 0 refills | Status: DC | PRN

## 2020-08-05 MED ORDER — ONDANSETRON HCL 4 MG/2ML IJ SOLN
4.0000 mg | Freq: Once | INTRAMUSCULAR | Status: AC
  Administered 2020-08-05: 4 mg via INTRAVENOUS
  Filled 2020-08-05: qty 2

## 2020-08-05 MED ORDER — MECLIZINE HCL 25 MG PO TABS
25.0000 mg | ORAL_TABLET | Freq: Once | ORAL | Status: AC
  Administered 2020-08-05: 25 mg via ORAL
  Filled 2020-08-05: qty 1

## 2020-08-05 MED ORDER — LACTATED RINGERS IV BOLUS
1000.0000 mL | Freq: Once | INTRAVENOUS | Status: AC
  Administered 2020-08-05: 1000 mL via INTRAVENOUS

## 2020-08-05 NOTE — ED Provider Notes (Signed)
[facility] Emergency Department Provider Note  ____________________________________________   Event Date/Time   First MD Initiated Contact with Patient 08/05/20 [phone]     (approximate)  I have reviewed the triage vital signs and the nursing notes.   HISTORY  Chief Complaint Dizziness   HPI Tonya Green is a 70 y.o. female with a past medical history of HTN, HDL, OSA on CPAP at night, regular extremity radiculopathy and DVT with known history of factor V Leiden, chronic lower back pain, anxiety and diverticulosis who presents calmly by her husband after she woke up around 3 AM feeling nauseous and vertiginous.  She also states she had some chest discomfort and shortness of breath and went to use the bathroom and had 3 episodes of nonbloody diarrhea.  She states she does not have any headache, earache, sore throat, vision changes, cough, fever, Donnell pain, actual vomiting, urinary symptoms or acute back pain or extremity pain.  States she did feel very unsteady on her feet secondary to her vertigo and lightheadedness and dizziness but otherwise did not fall or injure herself.  No prior similar episodes.  No clear alleviating or aggravating factors.  She does note she never eats dinner and had a dinner last night out with some friends and had a large chocolate cake.  She is not sure if this is related or if she could have gotten food poisoning.         Past Medical History:  Diagnosis Date  . Basal cell carcinoma    left nasal sidewall [facility] Winifred [facility]  . Carotid artery stenosis    mild L>R 03/26/16 [place]   . Diverticulosis    colonoscopy 10/13/16 [place]  . Factor 5 Leiden mutation, heterozygous ([place])   . Fatty liver   . History of blood transfusion    birth Rh factor   . Hyperlipidemia   . Hypertension   . OSA on CPAP   . Radiculopathy   . Right leg DVT ([place])    2010 s/p back surgery was on Coumadin x 6 months   . Thyroid  disease    hypothyroidism     Patient Active Problem List   Diagnosis Date Noted  . Neuropathy 05/24/2020  . Anxiety 09/22/2019  . Fatty liver 09/22/2019  . UTI (urinary tract infection) 08/21/2018  . Chronic low back pain with sciatica 06/17/2018  . Lumbar radiculopathy 12/18/2017  . Hyponatremia 12/18/2017  . Leukocytosis 12/18/2017  . Muscle spasm 12/18/2017  . OSA on CPAP 11/01/2017  . History of skin cancer 11/01/2017  . Carotid artery disease ([place]) 11/01/2017  . Essential hypertension 11/01/2017  . Hyperlipidemia 11/01/2017  . Heterozygous factor V Leiden mutation ([place]) 11/01/2017  . Hypothyroidism 11/01/2017  . Osteopenia 11/01/2017  . Osteoporosis 11/01/2017    Past Surgical History:  Procedure Laterality Date  . APPENDECTOMY     2000  . BREAST EXCISIONAL BIOPSY Left 2005   for infection  . BREAST SURGERY     biospy ? year   . MOHS SURGERY  05/11/2019   left nasal side wall bcc Dr. Winifred Olive in [place]   . ruptured disc repair     L4/5 07/4194 with complications residual right leg weakness/reduced sensation and DVT in 2010 post surgery     Prior to Admission medications   Medication Sig Start Date End Date Taking? Authorizing Provider  meclizine (ANTIVERT) 25 MG tablet Take 1 tablet (25 mg total) by mouth 3 (three) times daily  as needed for up to 3 days for dizziness. 08/05/20 08/08/20 Yes Lucrezia Starch, MD  ondansetron (ZOFRAN) 4 MG tablet Take 1 tablet (4 mg total) by mouth every 8 (eight) hours as needed for up to 10 doses for nausea or vomiting. 08/05/20  Yes Lucrezia Starch, MD  ALPHA LIPOIC ACID PO Take 250 mg by mouth.    [provider]  amLODipine (NORVASC) 2.5 MG tablet Take 1 tablet (2.5 mg total) by mouth daily. In am if BP>140/?90 take another tablet 06/21/20   McLean-Scocuzza, Nino Glow, MD  Ascorbic Acid (VITAMIN C) 1000 MG tablet Take 1,000 mg by mouth 3 (three) times daily.    [provider]  Cholecalciferol (VITAMIN D3) 5000 units CAPS  Take by mouth.    [provider]  diazepam (VALIUM) 2 MG tablet 1/2 pill to 1 pill qhs prn 09/22/19   McLean-Scocuzza, Nino Glow, MD  gabapentin (NEURONTIN) 100 MG capsule Take 3 capsules (300 mg total) by mouth 3 (three) times daily. 06/21/20   McLean-Scocuzza, Nino Glow, MD  HYDROcodone-acetaminophen (NORCO/VICODIN) 5-325 MG tablet Take 1 tablet by mouth 2 (two) times daily as needed (only for flare ups). 03/31/20   McLean-Scocuzza, Nino Glow, MD  levothyroxine (SYNTHROID) 50 MCG tablet Take 1 tablet (50 mcg total) by mouth daily before breakfast. 06/21/20   McLean-Scocuzza, Nino Glow, MD  MAGNESIUM MALATE PO Take 625 mg by mouth.    [provider]  Nattokinase 100 MG CAPS Take 100 mg by mouth daily.    [provider]  NON FORMULARY Mannose 500 mg qid  nattokinase 100 mg qd    [provider]  NON FORMULARY Bergamot orange extract 500mg / tocotricenol and tocophile complex 100mg     [provider]  Strontium Chloride POWD Use as directed 500 mg in the mouth or throat.     [provider]  UNABLE TO FIND Med Name: Gigi Gin plus Max calamarine    [provider]    Allergies Macrobid [nitrofurantoin macrocrystal] and Sulfa antibiotics  Family History  Problem Relation Age of Onset  . Hypertension Mother   . Stroke Mother        in 39s   . Heart disease Father   . Stroke Father   . Cancer Brother        esophageal cancer   . Stroke Paternal Grandfather   . Hyperlipidemia Other   . Breast cancer Paternal Aunt     Social History Social History   Tobacco Use  . Smoking status: Never Smoker  . Smokeless tobacco: Never Used  Vaping Use  . Vaping Use: Never used  Substance Use Topics  . Alcohol use: Yes    Comment: occ  . Drug use: Never    Review of Systems  Review of Systems  Constitutional: Negative for chills and fever.  HENT: Negative for sore throat.   Eyes: Negative for pain.  Respiratory: Negative for cough and  stridor.   Cardiovascular: Negative for chest pain.  Gastrointestinal: Positive for diarrhea. Negative for vomiting.  Genitourinary: Negative for dysuria.  Musculoskeletal: Negative for myalgias.  Skin: Negative for rash.  Neurological: Positive for dizziness. Negative for seizures, loss of consciousness and headaches.  Psychiatric/Behavioral: Negative for suicidal ideas.  All other systems reviewed and are negative.     ____________________________________________   PHYSICAL EXAM:  VITAL SIGNS: ED Triage Vitals  Enc Vitals Group     BP 08/05/20 0522 (!) 163/91     Pulse Rate  08/05/20 0522 97     Resp 08/05/20 0522 20     Temp 08/05/20 0522 98.2 F (36.8 C)     Temp Source 08/05/20 0522 Oral     SpO2 08/05/20 0522 98 %     Weight 08/05/20 0523 155 lb (70.3 kg)     Height 08/05/20 0523 5\' 5"  (1.651 m)     Head Circumference --      Peak Flow --      Pain Score 08/05/20 0523 0     Pain Loc --      Pain Edu? --      Excl. in [place]? --    Vitals:   08/05/20 0938 08/05/20 1030  BP: 140/60 137/67  Pulse: 71 85  Resp: 18 (!) 24  Temp: 98.2 F (36.8 C)   SpO2: 95% 96%   Physical Exam Vitals and nursing note reviewed.  Constitutional:      General: She is not in acute distress.    Appearance: She is well-developed.  HENT:     Head: Normocephalic and atraumatic.     Right Ear: External ear normal.     Left Ear: External ear normal.     Nose: Nose normal.  Eyes:     Conjunctiva/sclera: Conjunctivae normal.  Cardiovascular:     Rate and Rhythm: Normal rate and regular rhythm.     Heart sounds: No murmur heard.   Pulmonary:     Effort: Pulmonary effort is normal. No respiratory distress.     Breath sounds: Normal breath sounds.  Abdominal:     Palpations: Abdomen is soft.     Tenderness: There is no abdominal tenderness.  Musculoskeletal:     Cervical back: Neck supple.  Skin:    General: Skin is warm and dry.  Neurological:     Mental Status: She is alert.      Cranial nerves II through XII grossly intact.  No pronator drift.  No finger dysmetria.  Symmetric 5/5 strength in b/l upper extremities. 5/5 throughout LLE. 3/5 at R hip and knee which is baseline per patient. 5/5 on plantar and dosri flexion.  Sensation intact to light touch in all extremities.  Unremarkable unassisted gait.  ____________________________________________   LABS (all labs ordered are listed, but only abnormal results are displayed)  Labs Reviewed  COMPREHENSIVE METABOLIC PANEL - Abnormal; Notable for the following components:      Result Value   CO2 21 (*)    Glucose, Bld 112 (*)    All other components within normal limits  URINALYSIS, ROUTINE W REFLEX MICROSCOPIC - Abnormal; Notable for the following components:   Color, Urine STRAW (*)    APPearance CLEAR (*)    All other components within normal limits  RESP PANEL BY RT-PCR (FLU A&B, COVID) ARPGX2  CBC  TROPONIN I (HIGH SENSITIVITY)  TROPONIN I (HIGH SENSITIVITY)   ____________________________________________  EKG  Sinus rhythm with ventricular rate of 78, normal axis, unremarkable intervals and nonspecific ST change in V5 without other clear evidence of acute ischemia or other significant underlying arrhythmia. ____________________________________________  RADIOLOGY  ED MD interpretation: Chest x-ray shows no effusion, overt edema, focal consolidation or other clear acute intrathoracic process.  CT head is unremarkable.  Official radiology report(s): DG Chest 2 View  Result Date: 08/05/2020 CLINICAL DATA:  Chest pain EXAM: CHEST - 2 VIEW COMPARISON:  None. FINDINGS: The heart size and mediastinal contours are within normal limits. Both lungs are clear. The visualized skeletal structures are unremarkable.  IMPRESSION: No active cardiopulmonary disease. Electronically Signed   By: Fidela Salisbury MD   On: 08/05/2020 06:32   CT Head Wo Contrast  Result Date: 08/05/2020 CLINICAL DATA:  Dizziness, nausea  EXAM: CT HEAD WITHOUT CONTRAST TECHNIQUE: Contiguous axial images were obtained from the base of the skull through the vertex without intravenous contrast. COMPARISON:  None. FINDINGS: Brain: Normal anatomic configuration. No abnormal intra or extra-axial mass lesion or fluid collection. No abnormal mass effect or midline shift. No evidence of acute intracranial hemorrhage or infarct. Ventricular size is normal. Cerebellum unremarkable. Vascular: Unremarkable Skull: Intact Sinuses/Orbits: Paranasal sinuses are clear. Orbits are unremarkable. Other: Mastoid air cells and middle ear cavities are clear. IMPRESSION: No acute intracranial abnormality.  Normal exam. Electronically Signed   By: Fidela Salisbury MD   On: 08/05/2020 06:35   MR BRAIN WO CONTRAST  Result Date: 08/05/2020 CLINICAL DATA:  Acute neuro deficit.  Dizziness. EXAM: MRI HEAD WITHOUT CONTRAST TECHNIQUE: Multiplanar, multiecho pulse sequences of the brain and surrounding structures were obtained without intravenous contrast. COMPARISON:  CT head 08/05/2020 FINDINGS: Brain: No acute infarction, hemorrhage, hydrocephalus, extra-axial collection or mass lesion. Minimal white matter changes. Vascular: Normal arterial flow voids Skull and upper cervical spine: No focal skeletal abnormality. Sinuses/Orbits: Paranasal sinuses clear.  Negative orbit Other: None IMPRESSION: No significant abnormality.  Negative for acute infarct. Electronically Signed   By: Franchot Gallo M.D.   On: 08/05/2020 11:42    ____________________________________________   PROCEDURES  Procedure(s) performed (including Critical Care):  .1-3 Lead EKG Interpretation Performed by: Lucrezia Starch, MD Authorized by: Lucrezia Starch, MD     Interpretation: normal     ECG rate assessment: normal     Rhythm: sinus rhythm     Ectopy: none     Conduction: normal       ____________________________________________   INITIAL IMPRESSION / ASSESSMENT AND PLAN / ED  COURSE      Patient presents with above-stated history exam for assessment of cute onset of nausea, vertigo and lightheadedness associated with chest pain shortness of breath and some diarrhea.  On arrival patient is slightly hypertensive with a BP of 162/79 with otherwise stable vital signs on room air.  She does have a nonfocal supine neuro exam but did appear quite dizzy when moving her head.  Primary differential includes possible acute infectious process, BPPV, atypical presentation of ACS, pneumonia, arrhythmia, symptomatic anemia, and arrhythmia.  Very low suspicion despite history of factor V Leiden of PE as patient is not tachycardic, tachypneic or hypoxic  Chest x-ray has no findings to suggest acute heart failure or pneumonia.  In addition patient has no fever or elevated white blood cell count.  CT head shows no evidence of SAH or other acute intracranial process.  CMP shows no significant electrolyte or metabolic derangements.  CBC shows no leukocytosis or acute anemia.  Initial troponin is nonelevated at 3 and given repeat nonelevated with otherwise reassuring EKG patient stating her chest pain improved with her lower suspicion for ACS at this time.  It is certainly possible patient may have had some esophagitis from reflux given reported dinner last night which is very unusual for the patient she states she typically fast 18 hours a day.  UA does not appear infected.  Covid and influenza was negative.   With regard to her vertigo after trial of Antivert and Zofran and IV fluids she states she felt much better but still felt a little bit unsteady on her  feet.  Given persistent unsteadiness MR obtained that shows no evidence of stroke.  Suspect likely persistent vertigo from likely peripheral etiology.  Certainly possible she has some labyrinthitis although she does not describe other clear symptoms such as tinnitus or decreased hearing.  However given otherwise nonfocal neuro exam and  reassuring MRI brain and metabolic and cardiac work-up with patient stating she feels much better wishes to go home I think she is safe for discharge with plan for outpatient follow-up.  Rx written for Zofran and Antivert.  Discharged stable condition.  Strict precautions advised and discussed.  ____________________________________________   FINAL CLINICAL IMPRESSION(S) / ED DIAGNOSES  Final diagnoses:  Dizziness  Chest pain, unspecified type  Nausea    Medications  ondansetron (ZOFRAN) injection 4 mg (4 mg Intravenous Given 08/05/20 0736)  lactated ringers bolus 1,000 mL (1,000 mLs Intravenous New Bag/Given 08/05/20 0747)  meclizine (ANTIVERT) tablet 25 mg (25 mg Oral Given 08/05/20 0730)     ED Discharge Orders         Ordered    ondansetron (ZOFRAN) 4 MG tablet  Every 8 hours PRN        08/05/20 1150    meclizine (ANTIVERT) 25 MG tablet  3 times daily PRN        08/05/20 1150           Note:  This document was prepared using Dragon voice recognition software and may include unintentional dictation errors.   Lucrezia Starch, MD 08/05/20 [phone]

## 2020-08-05 NOTE — ED Triage Notes (Addendum)
Pt woke up at 0300 with do dizziness, has 3 episodes of diarrhea since. Pt also co nausea, and some left sided chest pain. Pt denies any hx of heart disease. Pt states went to bed at 2200 asymptomatic and woke up at 0300 with symptoms.

## 2020-08-05 NOTE — ED Notes (Signed)
Discussed symptoms with Dr. Quentin Cornwall and protocols done as ordered.

## 2020-08-05 NOTE — ED Notes (Signed)
Pt c/o dizziness, nausea, diaphoresis, and chest heaviness that started upon waking at 0300. Pt sts chest discomfort has resolved. Pt reports she is "off balance" and had to hold onto furniture to get around her home this morning.  Pt denies recent illness, cough, fever, vomiting. Pt reports 3 loose stools this morning. Pt has RLE weakness x months r/t lower back problems. No other focal weakness present.

## 2020-08-05 NOTE — ED Notes (Signed)
Pt alert and oriented X 4, stable for discharge. RR even and unlabored, color WNL. Discussed discharge instructions and follow-up as directed. Discharge medications discussed if prescribed. Pt had opportunity to ask questions, and RN to provide patient/family eduction.  

## 2020-08-05 NOTE — ED Notes (Signed)
Pt in CT prior to coming to room.

## 2020-11-23 ENCOUNTER — Encounter: Payer: Self-pay | Admitting: Internal Medicine

## 2020-11-23 ENCOUNTER — Ambulatory Visit (INDEPENDENT_AMBULATORY_CARE_PROVIDER_SITE_OTHER): Payer: Medicare Other | Admitting: Internal Medicine

## 2020-11-23 ENCOUNTER — Other Ambulatory Visit: Payer: Self-pay

## 2020-11-23 VITALS — BP 134/70 | HR 71 | Temp 98.7°F | Ht 65.0 in | Wt 154.2 lb

## 2020-11-23 DIAGNOSIS — G8929 Other chronic pain: Secondary | ICD-10-CM

## 2020-11-23 DIAGNOSIS — E785 Hyperlipidemia, unspecified: Secondary | ICD-10-CM | POA: Diagnosis not present

## 2020-11-23 DIAGNOSIS — Z1231 Encounter for screening mammogram for malignant neoplasm of breast: Secondary | ICD-10-CM

## 2020-11-23 DIAGNOSIS — R739 Hyperglycemia, unspecified: Secondary | ICD-10-CM | POA: Diagnosis not present

## 2020-11-23 DIAGNOSIS — Z1329 Encounter for screening for other suspected endocrine disorder: Secondary | ICD-10-CM | POA: Diagnosis not present

## 2020-11-23 DIAGNOSIS — M5416 Radiculopathy, lumbar region: Secondary | ICD-10-CM

## 2020-11-23 DIAGNOSIS — I10 Essential (primary) hypertension: Secondary | ICD-10-CM

## 2020-11-23 DIAGNOSIS — M810 Age-related osteoporosis without current pathological fracture: Secondary | ICD-10-CM | POA: Diagnosis not present

## 2020-11-23 DIAGNOSIS — N3000 Acute cystitis without hematuria: Secondary | ICD-10-CM

## 2020-11-23 DIAGNOSIS — M62838 Other muscle spasm: Secondary | ICD-10-CM

## 2020-11-23 LAB — LIPID PANEL
Cholesterol: 333 mg/dL — ABNORMAL HIGH (ref 0–200)
HDL: 53.1 mg/dL (ref >39.00)
LDL Cholesterol: 241 mg/dL — ABNORMAL HIGH (ref 0–99)
NonHDL: 279.8
Total CHOL/HDL Ratio: 6
Triglycerides: 194 mg/dL — ABNORMAL HIGH (ref 0.0–149.0)
VLDL: 38.8 mg/dL (ref 0.0–40.0)

## 2020-11-23 LAB — HEMOGLOBIN A1C: Hgb A1c MFr Bld: 5.9 % (ref 4.6–6.5)

## 2020-11-23 LAB — TSH: TSH: 3.18 u[IU]/mL (ref 0.35–4.50)

## 2020-11-23 MED ORDER — GABAPENTIN 100 MG PO CAPS: 300.0000 mg | ORAL_CAPSULE | Freq: Three times a day (TID) | ORAL | 11 refills | Status: DC

## 2020-11-23 MED ORDER — HYDROCODONE-ACETAMINOPHEN 5-325 MG PO TABS: 1.0000 | ORAL_TABLET | Freq: Two times a day (BID) | ORAL | 0 refills | Status: DC | PRN

## 2020-11-23 MED ORDER — DIAZEPAM 2 MG PO TABS: ORAL_TABLET | ORAL | 5 refills | Status: DC

## 2020-11-23 NOTE — Progress Notes (Signed)
Chief Complaint  Patient presents with   Follow-up   F/u  1. Htn controlled on norvasc 2.5 mg bid BP at goal home log <130/<80  2. Chronic pain low back and neuropathy right leg numbness motor intact chronically s/p back surgery takes valium 2 mg prn and prn norco contract signed today  3. Vertigo likely 08/05/20 took meclizine and improved for now no further episodes    Review of Systems  Constitutional:  Negative for weight loss.  HENT:  Negative for hearing loss.   Eyes:  Negative for blurred vision.  Respiratory:  Negative for shortness of breath.   Cardiovascular:  Negative for chest pain.  Gastrointestinal:  Negative for abdominal pain.  Musculoskeletal:  Positive for back pain.  Skin:  Negative for rash.  Neurological:  Positive for sensory change.  Psychiatric/Behavioral:  Negative for depression.   Past Medical History:  Diagnosis Date   Basal cell carcinoma    left nasal sidewall [facility]   Carotid artery stenosis    mild L>R 03/26/16 [place]    Diverticulosis    colonoscopy 10/13/16 [place]   Factor 5 Leiden mutation, heterozygous ([place])    Fatty liver    History of blood transfusion    birth Rh factor    Hyperlipidemia    Hypertension    OSA on CPAP    Radiculopathy    Right leg DVT ([place])    2010 s/p back surgery was on Coumadin x 6 months    Thyroid disease    hypothyroidism    Past Surgical History:  Procedure Laterality Date   APPENDECTOMY     2000   BREAST EXCISIONAL BIOPSY Left 2005   for infection   BREAST SURGERY     biospy ? year    MOHS SURGERY  05/11/2019   left nasal side wall bcc Dr. Winifred Olive in [place]    ruptured disc repair     L4/5 05/6107 with complications residual right leg weakness/reduced sensation and DVT in 2010 post surgery    Family History  Problem Relation Age of Onset   Hypertension Mother    Stroke Mother        in 35s    Heart disease Father    Stroke Father    Cancer Brother         esophageal cancer    Stroke Paternal Grandfather    Other Daughter        chronic back pain nerve sheath abnormal in spine s/p surgery   Breast cancer Paternal Aunt    Hyperlipidemia Other    Social History   Socioeconomic History   Marital status: Married    Spouse name: Not on file   Number of children: Not on file   Years of education: Not on file   Highest education level: Not on file  Occupational History   Not on file  Tobacco Use   Smoking status: Never   Smokeless tobacco: Never  Vaping Use   Vaping Use: Never used  Substance and Sexual Activity   Alcohol use: Yes    Comment: occ   Drug use: Never   Sexual activity: Not on file  Other Topics Concern   Not on file  Social History Narrative   BSN CCM, RN   Moved from [place] recently    2 daughters    Retired and married    No guns, wears seat belt, safe in relationship    Lives at [employer]  Social Determinants of Health   Financial Resource Strain: Low Risk    Difficulty of Paying Living Expenses: Not hard at all  Food Insecurity: No Food Insecurity   Worried About Charity fundraiser in the Last Year: Never true   [place] in the Last Year: Never true  Transportation Needs: No Transportation Needs   Lack of Transportation (Medical): No   Lack of Transportation (Non-Medical): No  Physical Activity: Sufficiently Active   Days of Exercise per Week: 7 days   Minutes of Exercise per Session: 60 min  Stress: No Stress Concern Present   Feeling of Stress : Not at all  Social Connections: Unknown   Frequency of Communication with Friends and Family: More than three times a week   Frequency of Social Gatherings with Friends and Family: Not on file   Attends Religious Services: Not on Electrical engineer or Organizations: Not on file   Attends Archivist Meetings: Not on file   Marital Status: Married  Human resources officer Violence: Not At Risk   Fear of Current or Ex-Partner: No    Emotionally Abused: No   Physically Abused: No   Sexually Abused: No   Current Meds  Medication Sig   ALPHA LIPOIC ACID PO Take 250 mg by mouth.   amLODipine (NORVASC) 2.5 MG tablet Take 1 tablet (2.5 mg total) by mouth daily. In am if BP>140/?90 take another tablet   Ascorbic Acid (VITAMIN C) 1000 MG tablet Take 1,000 mg by mouth 3 (three) times daily.   Cholecalciferol (VITAMIN D3) 5000 units CAPS Take by mouth.   levothyroxine (SYNTHROID) 50 MCG tablet Take 1 tablet (50 mcg total) by mouth daily before breakfast.   MAGNESIUM MALATE PO Take 625 mg by mouth.   Nattokinase 100 MG CAPS Take 100 mg by mouth daily.   Strontium Chloride POWD Use as directed 500 mg in the mouth or throat.    [DISCONTINUED] diazepam (VALIUM) 2 MG tablet 1/2 pill to 1 pill qhs prn   [DISCONTINUED] gabapentin (NEURONTIN) 100 MG capsule Take 3 capsules (300 mg total) by mouth 3 (three) times daily.   [DISCONTINUED] HYDROcodone-acetaminophen (NORCO/VICODIN) 5-325 MG tablet Take 1 tablet by mouth 2 (two) times daily as needed (only for flare ups).   Allergies  Allergen Reactions   Macrobid [Nitrofurantoin Macrocrystal]     Nausea and sob    Sulfa Antibiotics     Swelling     No results found for this or any previous visit (from the past 2160 hour(s)). Objective  Body mass index is 25.66 kg/m. Wt Readings from Last 3 Encounters:  11/23/20 154 lb 3.2 oz (69.9 kg)  08/05/20 155 lb (70.3 kg)  06/21/20 155 lb (70.3 kg)   Temp Readings from Last 3 Encounters:  11/23/20 98.7 F (37.1 C) (Oral)  08/05/20 98.2 F (36.8 C) (Oral)  03/31/20 97.7 F (36.5 C) (Oral)   BP Readings from Last 3 Encounters:  11/23/20 134/70  08/05/20 (!) 143/66  03/31/20 134/82   Pulse Readings from Last 3 Encounters:  11/23/20 71  08/05/20 69  03/31/20 (!) 58    Physical Exam Vitals and nursing note reviewed.  Constitutional:      Appearance: Normal appearance. She is well-developed and well-groomed.  HENT:      Head: Normocephalic and atraumatic.  Eyes:     Conjunctiva/sclera: Conjunctivae normal.     Pupils: Pupils are equal, round, and reactive to light.  Cardiovascular:  Rate and Rhythm: Normal rate and regular rhythm.     Heart sounds: Normal heart sounds. No murmur heard. Pulmonary:     Effort: Pulmonary effort is normal.     Breath sounds: Normal breath sounds.  Abdominal:     Tenderness: There is no abdominal tenderness.  Skin:    General: Skin is warm and dry.  Neurological:     General: No focal deficit present.     Mental Status: She is alert and oriented to person, place, and time. Mental status is at baseline.     Gait: Gait normal.  Psychiatric:        Attention and Perception: Attention and perception normal.        Mood and Affect: Mood and affect normal.        Speech: Speech normal.        Behavior: Behavior normal. Behavior is cooperative.        Thought Content: Thought content normal.        Cognition and Memory: Cognition and memory normal.        Judgment: Judgment normal.    Assessment  Plan  Hyperlipidemia, unspecified hyperlipidemia type - Plan: Lipid panel  Screening mammogram, encounter for - Plan: MM 3D SCREEN BREAST BILATERAL Osteoporosis, unspecified osteoporosis type, unspecified pathological fracture presence - Plan: DG Bone Density Will due after 06/21/20 orders in   Hyperglycemia - Plan: Hemoglobin A1c  Acute cystitis without hematuria - Plan: Urinalysis, Routine w reflex microscopic, Urine Culture  Other chronic pain - Plan: HYDROcodone-acetaminophen (NORCO/VICODIN) 5-325 MG tablet Pain contract today Muscle spasm - Plan: diazepam (VALIUM) 2 MG tablet Lumbar radiculopathy - Plan: gabapentin (NEURONTIN) 100 MG capsule   Htn controlled on norvasc 2.5 mg qd  HM Declined, prevnar, pna 23 vaccine in the past reassess in future Flu vaccine had 02/18/20  Had zostervax 2012 shingrix 2/2 had 2nd dose which caused flare of lumbar radiculopathy   Pfizer 4/4 Tdap had 06/29/15 Hep c neg 01/10/17 A1C 5.5 01/10/17    Out of age window pap h/o abnormal pap in 20s but normal since then   Colonoscopy had 10/13/16 diverticulosis reviewed letter will need report in future -Colonoscopy 10/13/16 diverticulosis [facility] Dr. Cathlean Cower fax # [phone]    Mammogram had 01/28/19 normal wait until 06/21/21 per pt request with DEXA ordered    DEXA 05/2017 (h/o osteopenia hip and osteoporosis spine) get records prior PCP -per pt this is why taking Strontium 5000 mg qd and had helped with osteoporosis improved to osteopenia over 3 years  -DEXA -3.0 left radius ostepenia lumbar/hip 06/22/2019 wait 2 years for repeat  -declines pharm tx    Dr. Santiago Glad tbse h/o BCC forehead and SCC nose needs tbse appt sch 02/03/18 no bxs normal exam appt upcoming 2021   eye exam in the future h/o flashes of light left eye resolved but negative retina issues/detachment per pt will see eye exam yearly Mohs surgical site 05/11/19 trouble hearing      Never smoker    Life line screening 03/26/16 left mild CAS No Afib Nl ABI TC 298, HDL 52, LDL 212, TG 170 declines statin Echo had in 2014 normal   Has MOST form     Provider: Dr. Olivia Mackie McLean-[facility]

## 2020-11-23 NOTE — Patient Instructions (Signed)
Call to schedule mammogram and bone density 06/21/21 [place] or after

## 2020-11-24 LAB — URINALYSIS, ROUTINE W REFLEX MICROSCOPIC
Bilirubin Urine: NEGATIVE
Glucose, UA: NEGATIVE
Hgb urine dipstick: NEGATIVE
Ketones, ur: NEGATIVE
Leukocytes,Ua: NEGATIVE
Nitrite: NEGATIVE
Protein, ur: NEGATIVE
Specific Gravity, Urine: 1.013 (ref 1.001–1.035)
pH: 6 (ref 5.0–8.0)

## 2020-11-24 LAB — URINE CULTURE
MICRO NUMBER:: 12064987
Result:: NO GROWTH
SPECIMEN QUALITY:: ADEQUATE

## 2020-11-30 ENCOUNTER — Encounter: Payer: Self-pay | Admitting: Internal Medicine

## 2020-11-30 NOTE — Telephone Encounter (Signed)
For your information  

## 2021-03-03 ENCOUNTER — Telehealth: Payer: Self-pay | Admitting: Internal Medicine

## 2021-03-03 NOTE — Telephone Encounter (Signed)
err

## 2021-04-12 ENCOUNTER — Ambulatory Visit (INDEPENDENT_AMBULATORY_CARE_PROVIDER_SITE_OTHER): Payer: Medicare Other | Admitting: Adult Health

## 2021-04-12 ENCOUNTER — Other Ambulatory Visit: Payer: Self-pay

## 2021-04-12 ENCOUNTER — Encounter: Payer: Self-pay | Admitting: Adult Health

## 2021-04-12 VITALS — BP 132/80 | HR 80 | Temp 97.5°F | Ht 65.0 in | Wt 162.4 lb

## 2021-04-12 DIAGNOSIS — N3000 Acute cystitis without hematuria: Secondary | ICD-10-CM

## 2021-04-12 LAB — COMPREHENSIVE METABOLIC PANEL
ALT: 23 U/L (ref 0–35)
AST: 19 U/L (ref 0–37)
Albumin: 4.5 g/dL (ref 3.5–5.2)
Alkaline Phosphatase: 80 U/L (ref 39–117)
BUN: 14 mg/dL (ref 6–23)
CO2: 24 mEq/L (ref 19–32)
Calcium: 9.4 mg/dL (ref 8.4–10.5)
Chloride: 101 mEq/L (ref 96–112)
Creatinine, Ser: 0.75 mg/dL (ref 0.40–1.20)
GFR: 80.22 mL/min (ref >60.00)
Glucose, Bld: 98 mg/dL (ref 70–99)
Potassium: 4.1 mEq/L (ref 3.5–5.1)
Sodium: 133 mEq/L — ABNORMAL LOW (ref 135–145)
Total Bilirubin: 0.5 mg/dL (ref 0.2–1.2)
Total Protein: 7.8 g/dL (ref 6.0–8.3)

## 2021-04-12 LAB — URINALYSIS, MICROSCOPIC ONLY: RBC / HPF: NONE SEEN (ref 0–?)

## 2021-04-12 LAB — CBC WITH DIFFERENTIAL/PLATELET
Basophils Absolute: 0.1 10*3/uL (ref 0.0–0.1)
Basophils Relative: 1 % (ref 0.0–3.0)
Eosinophils Absolute: 0.1 10*3/uL (ref 0.0–0.7)
Eosinophils Relative: 1.4 % (ref 0.0–5.0)
HCT: 39.9 % (ref 36.0–46.0)
Hemoglobin: 13.3 g/dL (ref 12.0–15.0)
Lymphocytes Relative: 40.7 % (ref 12.0–46.0)
Lymphs Abs: 2.8 10*3/uL (ref 0.7–4.0)
MCHC: 33.4 g/dL (ref 30.0–36.0)
MCV: 92.8 fl (ref 78.0–100.0)
Monocytes Absolute: 0.5 10*3/uL (ref 0.1–1.0)
Monocytes Relative: 7.2 % (ref 3.0–12.0)
Neutro Abs: 3.4 10*3/uL (ref 1.4–7.7)
Neutrophils Relative %: 49.7 % (ref 43.0–77.0)
Platelets: 277 10*3/uL (ref 150.0–400.0)
RBC: 4.3 Mil/uL (ref 3.87–5.11)
RDW: 12.7 % (ref 11.5–15.5)
WBC: 6.9 10*3/uL (ref 4.0–10.5)

## 2021-04-12 LAB — POCT URINALYSIS DIPSTICK
Bilirubin, UA: NEGATIVE
Blood, UA: NEGATIVE
Glucose, UA: NEGATIVE
Ketones, UA: NEGATIVE
Leukocytes, UA: NEGATIVE
Nitrite, UA: NEGATIVE
Protein, UA: NEGATIVE
Spec Grav, UA: 1.01 (ref 1.010–1.025)
Urobilinogen, UA: 0.2 E.U./dL
pH, UA: 7 (ref 5.0–8.0)

## 2021-04-12 MED ORDER — AMOXICILLIN-POT CLAVULANATE 875-125 MG PO TABS: 1.0000 | ORAL_TABLET | Freq: Two times a day (BID) | ORAL | 0 refills | Status: DC

## 2021-04-12 NOTE — Progress Notes (Signed)
Urine negative, she did finish Cipro yesterday morning. Sent for urine culture.

## 2021-04-12 NOTE — Progress Notes (Signed)
Acute Office Visit  Subjective:    Patient ID: Tonya Green, female    DOB: 03/01/1951, 70 y.o.   MRN: [id number]  Chief Complaint  Patient presents with   Recurrent UTI    Pt has symptoms of burning while urinating, aching bladder, extreme fatigue and no appetite    Urinary Tract Infection  This is a recurrent problem. The current episode started 1 to 4 weeks ago (last dose of cipro was yesterday morning and she started back last night with urinary burning, and cloudy urine.). The problem has been gradually worsening. The quality of the pain is described as burning and aching. The pain is mild. There has been no fever. Associated symptoms include frequency, hesitancy and urgency. Pertinent negatives include no chills, discharge, flank pain, hematuria, nausea, possible pregnancy, sweats or vomiting. She has tried antibiotics for the symptoms. The treatment provided no relief. Her past medical history is significant for recurrent UTIs.   Treated with Cipro 04/04/21 to 04/11/21- e coli on culture and noted to be sensitive to Cipro. At [facility] clinic.  She reports today she has dysuria like she had when she was seen at [facility], urine is cloudy, odor. Denies any hematuria.  Denies any pain in abdomen or flank.  Denies any vaginal discomfort or discharge.   Patient  denies any fever, body aches,chills, rash, chest pain, shortness of breath, nausea, vomiting, or diarrhea.    Past Medical History:  Diagnosis Date   Basal cell carcinoma    left nasal sidewall [facility]   Carotid artery stenosis    mild L>R 03/26/16 [place]    Diverticulosis    colonoscopy 10/13/16 [place]   Factor 5 Leiden mutation, heterozygous ([place])    Fatty liver    History of blood transfusion    birth Rh factor    Hyperlipidemia    Hypertension    OSA on CPAP    Radiculopathy    Right leg DVT ([place])    2010 s/p back surgery was on Coumadin x 6 months    Thyroid disease     hypothyroidism     Past Surgical History:  Procedure Laterality Date   APPENDECTOMY     2000   BREAST EXCISIONAL BIOPSY Left 2005   for infection   BREAST SURGERY     biospy ? year    MOHS SURGERY  05/11/2019   left nasal side wall bcc Dr. Winifred Olive in [place]    ruptured disc repair     L4/5 12/5275 with complications residual right leg weakness/reduced sensation and DVT in 2010 post surgery     Family History  Problem Relation Age of Onset   Hypertension Mother    Stroke Mother        in 34s    Heart disease Father    Stroke Father    Cancer Brother        esophageal cancer    Stroke Paternal Grandfather    Other Daughter        chronic back pain nerve sheath abnormal in spine s/p surgery   Breast cancer Paternal Aunt    Hyperlipidemia Other     Social History   Socioeconomic History   Marital status: Married    Spouse name: Not on file   Number of children: Not on file   Years of education: Not on file   Highest education level: Not on file  Occupational History   Not on file  Tobacco Use  Smoking status: Never   Smokeless tobacco: Never  Vaping Use   Vaping Use: Never used  Substance and Sexual Activity   Alcohol use: Yes    Comment: occ   Drug use: Never   Sexual activity: Not on file  Other Topics Concern   Not on file  Social History Narrative   BSN CCM, RN   Moved from [place] recently    2 daughters    Retired and married    No guns, wears seat belt, safe in relationship    Lives at [place] Determinants of Health   Financial Resource Strain: Low Risk    Difficulty of Paying Living Expenses: Not hard at all  Food Insecurity: No Food Insecurity   Worried About Charity fundraiser in the Last Year: Never true   Arboriculturist in the Last Year: Never true  Transportation Needs: No Transportation Needs   Lack of Transportation (Medical): No   Lack of Transportation (Non-Medical): No  Physical Activity: Sufficiently Active   Days  of Exercise per Week: 7 days   Minutes of Exercise per Session: 60 min  Stress: No Stress Concern Present   Feeling of Stress : Not at all  Social Connections: Unknown   Frequency of Communication with Friends and Family: More than three times a week   Frequency of Social Gatherings with Friends and Family: Not on file   Attends Religious Services: Not on Electrical engineer or Organizations: Not on file   Attends Archivist Meetings: Not on file   Marital Status: Married  Human resources officer Violence: Not At Risk   Fear of Current or Ex-Partner: No   Emotionally Abused: No   Physically Abused: No   Sexually Abused: No    Outpatient Medications Prior to Visit  Medication Sig Dispense Refill   ALPHA LIPOIC ACID PO Take 250 mg by mouth.     amLODipine (NORVASC) 2.5 MG tablet Take 1 tablet (2.5 mg total) by mouth daily. In am if BP>140/?90 take another tablet 180 tablet 3   Ascorbic Acid (VITAMIN C) 1000 MG tablet Take 1,000 mg by mouth 3 (three) times daily.     Cholecalciferol (VITAMIN D3) 5000 units CAPS Take by mouth.     diazepam (VALIUM) 2 MG tablet 1/2 pill to 1 pill qhs prn 30 tablet 5   gabapentin (NEURONTIN) 100 MG capsule Take 3 capsules (300 mg total) by mouth 3 (three) times daily. 560 capsule 11   HYDROcodone-acetaminophen (NORCO/VICODIN) 5-325 MG tablet Take 1 tablet by mouth 2 (two) times daily as needed (only for flare ups). 30 tablet 0   levothyroxine (SYNTHROID) 50 MCG tablet Take 1 tablet (50 mcg total) by mouth daily before breakfast. 90 tablet 3   MAGNESIUM MALATE PO Take 625 mg by mouth.     Nattokinase 100 MG CAPS Take 100 mg by mouth daily.     Strontium Chloride POWD Use as directed 500 mg in the mouth or throat.      UNABLE TO FIND Med Name: Omega Q plus Max calamarine     No facility-administered medications prior to visit.    Allergies  Allergen Reactions   Macrobid [Nitrofurantoin Macrocrystal]     Nausea and sob    Sulfa Antibiotics      Swelling      Review of Systems  Constitutional:  Positive for fatigue. Negative for activity change, appetite change, chills, diaphoresis, fever and  unexpected weight change.  HENT: Negative.    Respiratory: Negative.    Cardiovascular: Negative.   Gastrointestinal: Negative.  Negative for nausea and vomiting.  Endocrine: Negative for polydipsia, polyphagia and polyuria.  Genitourinary:  Positive for dysuria, frequency, hesitancy and urgency. Negative for decreased urine volume, difficulty urinating, dyspareunia, enuresis, flank pain, genital sores, hematuria, menstrual problem, pelvic pain, vaginal bleeding, vaginal discharge and vaginal pain.  Neurological: Negative.   Psychiatric/Behavioral: Negative.        Objective:    Physical Exam Vitals reviewed.  Constitutional:      General: She is not in acute distress.    Appearance: Normal appearance. She is not ill-appearing, toxic-appearing or diaphoretic.  HENT:     Head: Normocephalic and atraumatic.     Right Ear: External ear normal.     Left Ear: External ear normal.     Nose: Nose normal.     Mouth/Throat:     Mouth: Mucous membranes are moist.  Eyes:     Conjunctiva/sclera: Conjunctivae normal.     Pupils: Pupils are equal, round, and reactive to light.  Cardiovascular:     Rate and Rhythm: Normal rate and regular rhythm.     Pulses: Normal pulses.     Heart sounds: Normal heart sounds.  Pulmonary:     Effort: Pulmonary effort is normal. No respiratory distress.     Breath sounds: Normal breath sounds. No stridor. No wheezing, rhonchi or rales.  Chest:     Chest wall: No tenderness.  Abdominal:     General: There is no distension.     Palpations: Abdomen is soft.     Tenderness: There is abdominal tenderness (patient reports mild tenderness with palpation) in the suprapubic area. There is no right CVA tenderness, left CVA tenderness or guarding.    Musculoskeletal:        General: Normal range of motion.      Cervical back: Normal range of motion and neck supple.     Right lower leg: No edema.     Left lower leg: No edema.  Lymphadenopathy:     Cervical: No cervical adenopathy.  Skin:    General: Skin is warm.     Findings: No erythema or rash.  Neurological:     Mental Status: She is alert and oriented to person, place, and time.     Cranial Nerves: No cranial nerve deficit.     Motor: No weakness.     Gait: Gait normal.  Psychiatric:        Mood and Affect: Mood normal.        Behavior: Behavior normal.        Thought Content: Thought content normal.        Judgment: Judgment normal.    BP 132/80   Pulse 80   Temp (!) 97.5 F (36.4 C)   Ht 5\' 5"  (1.651 m)   Wt 162 lb 6.4 oz (73.7 kg)   SpO2 98%   BMI 27.02 kg/m  Wt Readings from Last 3 Encounters:  04/12/21 162 lb 6.4 oz (73.7 kg)  11/23/20 154 lb 3.2 oz (69.9 kg)  08/05/20 155 lb (70.3 kg)    Health Maintenance Due  Topic Date Due   Pneumonia Vaccine 45+ Years old (1 - PCV) Never done   COVID-19 Vaccine (5 - Booster for Pfizer series) 09/04/2020   INFLUENZA VACCINE  12/26/2020    There are no preventive care reminders to display for this patient.   Lab Results  Component Value Date   TSH 3.18 11/23/2020   Lab Results  Component Value Date   WBC 6.9 08/05/2020   HGB 13.3 08/05/2020   HCT 39.8 08/05/2020   MCV 93.0 08/05/2020   PLT 271 08/05/2020   Lab Results  Component Value Date   NA 137 08/05/2020   K 3.8 08/05/2020   CO2 21 (L) 08/05/2020   GLUCOSE 112 (H) 08/05/2020   BUN 16 08/05/2020   CREATININE 0.69 08/05/2020   BILITOT 0.6 08/05/2020   ALKPHOS 81 08/05/2020   AST 20 08/05/2020   ALT 20 08/05/2020   PROT 7.2 08/05/2020   ALBUMIN 4.1 08/05/2020   CALCIUM 9.1 08/05/2020   ANIONGAP 8 08/05/2020   GFR 69.04 07/03/2019   Lab Results  Component Value Date   CHOL 333 (H) 11/23/2020   Lab Results  Component Value Date   HDL 53.10 11/23/2020   Lab Results  Component Value Date    LDLCALC 241 (H) 11/23/2020   Lab Results  Component Value Date   TRIG 194.0 (H) 11/23/2020   Lab Results  Component Value Date   CHOLHDL 6 11/23/2020   Lab Results  Component Value Date   HGBA1C 5.9 11/23/2020       Assessment & Plan:   Problem List Items Addressed This Visit       Genitourinary   UTI (urinary tract infection) - Primary   Relevant Medications   amoxicillin-clavulanate (AUGMENTIN) 875-125 MG tablet   Other Relevant Orders   Urine Microscopic Only (Completed)   POCT Urinalysis Dipstick (Completed)   CBC with Differential/Platelet   Comprehensive metabolic panel   Urine Culture     Meds ordered this encounter  Medications   amoxicillin-clavulanate (AUGMENTIN) 875-125 MG tablet    Sig: Take 1 tablet by mouth 2 (two) times daily.    Dispense:  10 tablet    Refill:  0  Discussed signs of C- difficile, she is doing probiotic yogurt. Last culture showed sensitivity to Augmentin, clear urine on POCT today however she finished Cipro yesterday morning so results likely skewed.   Red Flags discussed. The patient was given clear instructions to go to ER or return to medical center if any red flags develop, symptoms do not improve, worsen or new problems develop. They verbalized understanding.  Return in 5 days (on 04/17/2021), or if symptoms worsen or fail to improve, for at any time for any worsening symptoms.   Marcille Buffy, FNP

## 2021-04-12 NOTE — Patient Instructions (Signed)
Urinary Tract Infection, Adult A urinary tract infection (UTI) is an infection of any part of the urinary tract. The urinary tract includes the kidneys, ureters, bladder, and urethra. These organs make, store, and get rid of urine in the body. An upper UTI affects the ureters and kidneys. A lower UTI affects the bladder and urethra. What are the causes? Most urinary tract infections are caused by bacteria in your genital area around your urethra, where urine leaves your body. These bacteria grow and cause inflammation of your urinary tract. What increases the risk? You are more likely to develop this condition if: You have a urinary catheter that stays in place. You are not able to control when you urinate or have a bowel movement (incontinence). You are female and you: Use a spermicide or diaphragm for birth control. Have low estrogen levels. Are pregnant. You have certain genes that increase your risk. You are sexually active. You take antibiotic medicines. You have a condition that causes your flow of urine to slow down, such as: An enlarged prostate, if you are female. Blockage in your urethra. A kidney stone. A nerve condition that affects your bladder control (neurogenic bladder). Not getting enough to drink, or not urinating often. You have certain medical conditions, such as: Diabetes. A weak disease-fighting system (immunesystem). Sickle cell disease. Gout. Spinal cord injury. What are the signs or symptoms? Symptoms of this condition include: Needing to urinate right away (urgency). Frequent urination. This may include small amounts of urine each time you urinate. Pain or burning with urination. Blood in the urine. Urine that smells bad or unusual. Trouble urinating. Cloudy urine. Vaginal discharge, if you are female. Pain in the abdomen or the lower back. You may also have: Vomiting or a decreased appetite. Confusion. Irritability or tiredness. A fever or  chills. Diarrhea. The first symptom in older adults may be confusion. In some cases, they may not have any symptoms until the infection has worsened. How is this diagnosed? This condition is diagnosed based on your medical history and a physical exam. You may also have other tests, including: Urine tests. Blood tests. Tests for STIs (sexually transmitted infections). If you have had more than one UTI, a cystoscopy or imaging studies may be done to determine the cause of the infections. How is this treated? Treatment for this condition includes: Antibiotic medicine. Over-the-counter medicines to treat discomfort. Drinking enough water to stay hydrated. If you have frequent infections or have other conditions such as a kidney stone, you may need to see a health care provider who specializes in the urinary tract (urologist). In rare cases, urinary tract infections can cause sepsis. Sepsis is a life-threatening condition that occurs when the body responds to an infection. Sepsis is treated in the hospital with IV antibiotics, fluids, and other medicines. Follow these instructions at home: Medicines Take over-the-counter and prescription medicines only as told by your health care provider. If you were prescribed an antibiotic medicine, take it as told by your health care provider. Do not stop using the antibiotic even if you start to feel better. General instructions Make sure you: Empty your bladder often and completely. Do not hold urine for long periods of time. Empty your bladder after sex. Wipe from front to back after urinating or having a bowel movement if you are female. Use each tissue only one time when you wipe. Drink enough fluid to keep your urine pale yellow. Keep all follow-up visits. This is important. Contact a health care provider   if: Your symptoms do not get better after 1-2 days. Your symptoms go away and then return. Get help right away if: You have severe pain in your  back or your lower abdomen. You have a fever or chills. You have nausea or vomiting. Summary A urinary tract infection (UTI) is an infection of any part of the urinary tract, which includes the kidneys, ureters, bladder, and urethra. Most urinary tract infections are caused by bacteria in your genital area. Treatment for this condition often includes antibiotic medicines. If you were prescribed an antibiotic medicine, take it as told by your health care provider. Do not stop using the antibiotic even if you start to feel better. Keep all follow-up visits. This is important. This information is not intended to replace advice given to you by your health care provider. Make sure you discuss any questions you have with your health care provider. Document Revised: 12/25/2019 Document Reviewed: 12/25/2019 Elsevier Patient Education  [place]. Amoxicillin; Clavulanic Acid Tablets What is this medication? AMOXICILLIN; CLAVULANIC ACID (a mox i SIL in; KLAV yoo lan ic AS id) treats infections caused by bacteria. It belongs to a group of medications called penicillin antibiotics. It will not treat colds, the flu, or infections caused by viruses. This medicine may be used for other purposes; ask your health care provider or pharmacist if you have questions. COMMON BRAND NAME(S): Augmentin What should I tell my care team before I take this medication? They need to know if you have any of these conditions: Kidney disease Liver disease Mononucleosis Stomach or intestine problems such as colitis An unusual or allergic reaction to amoxicillin, other penicillin or cephalosporin antibiotics, clavulanic acid, other medications, foods, dyes, or preservatives Pregnant or trying to get pregnant Breast-feeding How should I use this medication? Take this medication by mouth. Take it as directed on the prescription label at the same time every day. Take it with food at the start of a meal or snack. Take  all of this medication unless your care team tells you to stop it early. Keep taking it even if you think you are better. Talk to your care team about the use of this medication in children. While it may be prescribed for selected conditions, precautions do apply. Overdosage: If you think you have taken too much of this medicine contact a poison control center or emergency room at once. NOTE: This medicine is only for you. Do not share this medicine with others. What if I miss a dose? If you miss a dose, take it as soon as you can. If it is almost time for your next dose, take only that dose. Do not take double or extra doses. What may interact with this medication? Allopurinol Anticoagulants Birth control pills Methotrexate Probenecid This list may not describe all possible interactions. Give your health care provider a list of all the medicines, herbs, non-prescription drugs, or dietary supplements you use. Also tell them if you smoke, drink alcohol, or use illegal drugs. Some items may interact with your medicine. What should I watch for while using this medication? Tell your care team if your symptoms do not start to get better or if they get worse. This medication may cause serious skin reactions. They can happen weeks to months after starting the medication. Contact your care team right away if you notice fevers or flu-like symptoms with a rash. The rash may be red or purple and then turn into blisters or peeling of the skin. Or, you  might notice a red rash with swelling of the face, lips or lymph nodes in your neck or under your arms. Do not treat diarrhea with over the counter products. Contact your care team if you have diarrhea that lasts more than 2 days or if it is severe and watery. If you have diabetes, you may get a false-positive result for sugar in your urine. Check with your care team. Birth control may not work properly while you are taking this medication. Talk to your care team  about using an extra method of birth control. What side effects may I notice from receiving this medication? Side effects that you should report to your care team as soon as possible: Allergic reactions--skin rash, itching, hives, swelling of the face, lips, tongue, or throat Liver injury--right upper belly pain, loss of appetite, nausea, light-colored stool, dark yellow or brown urine, yellowing skin or eyes, unusual weakness or fatigue Redness, blistering, peeling, or loosening of the skin, including inside the mouth Severe diarrhea, fever Unusual vaginal discharge, itching, or odor Side effects that usually do not require medical attention (report to your care team if they continue or are bothersome): Diarrhea Nausea Vomiting This list may not describe all possible side effects. Call your doctor for medical advice about side effects. You may report side effects to FDA at 1-800-FDA-1088. Where should I keep my medication? Keep out of the reach of children and pets. Store at room temperature between 20 and 25 degrees C (68 and 77 degrees F). Throw away any unused medication after the expiration date. NOTE: This sheet is a summary. It may not cover all possible information. If you have questions about this medicine, talk to your doctor, pharmacist, or health care provider.  2022 Elsevier/Gold Standard (2020-05-08 00:00:00)

## 2021-04-13 NOTE — Progress Notes (Signed)
Sodium is slightly low at 133, she may be over hydrating, can slightly decrease fluids and add on an electrolyte drink such as liquid IV, Gatorade for a few days. Recheck CMP add lab for 2 weeks.

## 2021-04-14 ENCOUNTER — Encounter: Payer: Self-pay | Admitting: Internal Medicine

## 2021-04-14 LAB — CULTURE, URINE COMPREHENSIVE
MICRO NUMBER:: 12645562
RESULT:: NO GROWTH
SPECIMEN QUALITY:: ADEQUATE

## 2021-04-14 NOTE — Addendum Note (Signed)
Addended by: Elpidio Galea T on: 04/14/2021 04:23 PM   Modules accepted: Orders

## 2021-04-16 NOTE — Progress Notes (Signed)
No urine bacterial growth.  Advised patient call the office or your primary care doctor for an appointment if no improvement within 72 hours or if any symptoms change or worsen at any time  Advised ER or urgent Care if after hours or on weekend. Call 911 for emergency symptoms at any time.Patinet verbalized understanding of all instructions given/reviewed and treatment plan and has no further questions or concerns at this time.

## 2021-05-01 ENCOUNTER — Other Ambulatory Visit: Payer: Self-pay

## 2021-05-01 ENCOUNTER — Other Ambulatory Visit (INDEPENDENT_AMBULATORY_CARE_PROVIDER_SITE_OTHER): Payer: Medicare Other

## 2021-05-01 DIAGNOSIS — N3000 Acute cystitis without hematuria: Secondary | ICD-10-CM

## 2021-05-01 LAB — COMPREHENSIVE METABOLIC PANEL WITH GFR
ALT: 20 U/L (ref 0–35)
AST: 18 U/L (ref 0–37)
Albumin: 4.3 g/dL (ref 3.5–5.2)
Alkaline Phosphatase: 69 U/L (ref 39–117)
BUN: 17 mg/dL (ref 6–23)
CO2: 27 meq/L (ref 19–32)
Calcium: 9.5 mg/dL (ref 8.4–10.5)
Chloride: 104 meq/L (ref 96–112)
Creatinine, Ser: 0.72 mg/dL (ref 0.40–1.20)
GFR: 84.22 mL/min
Glucose, Bld: 89 mg/dL (ref 70–99)
Potassium: 4.4 meq/L (ref 3.5–5.1)
Sodium: 138 meq/L (ref 135–145)
Total Bilirubin: 0.4 mg/dL (ref 0.2–1.2)
Total Protein: 7 g/dL (ref 6.0–8.3)

## 2021-05-01 NOTE — Progress Notes (Signed)
CMP is within normal limits.

## 2021-05-03 ENCOUNTER — Telehealth: Payer: Self-pay

## 2021-05-03 NOTE — Telephone Encounter (Signed)
-----   Message from Doreen Beam, [place] sent at 05/01/2021  4:36 PM EST ----- CMP is within normal limits.

## 2021-05-25 ENCOUNTER — Other Ambulatory Visit: Payer: Self-pay

## 2021-05-25 ENCOUNTER — Encounter: Payer: Self-pay | Admitting: Internal Medicine

## 2021-05-25 ENCOUNTER — Ambulatory Visit (INDEPENDENT_AMBULATORY_CARE_PROVIDER_SITE_OTHER): Payer: Medicare Other | Admitting: Internal Medicine

## 2021-05-25 VITALS — BP 138/80 | HR 82 | Temp 96.3°F | Ht 65.0 in | Wt 167.2 lb

## 2021-05-25 DIAGNOSIS — R7303 Prediabetes: Secondary | ICD-10-CM

## 2021-05-25 DIAGNOSIS — M5416 Radiculopathy, lumbar region: Secondary | ICD-10-CM

## 2021-05-25 DIAGNOSIS — N390 Urinary tract infection, site not specified: Secondary | ICD-10-CM | POA: Diagnosis not present

## 2021-05-25 DIAGNOSIS — H269 Unspecified cataract: Secondary | ICD-10-CM

## 2021-05-25 DIAGNOSIS — E039 Hypothyroidism, unspecified: Secondary | ICD-10-CM

## 2021-05-25 DIAGNOSIS — G8929 Other chronic pain: Secondary | ICD-10-CM

## 2021-05-25 DIAGNOSIS — M62838 Other muscle spasm: Secondary | ICD-10-CM

## 2021-05-25 DIAGNOSIS — I10 Essential (primary) hypertension: Secondary | ICD-10-CM

## 2021-05-25 DIAGNOSIS — E559 Vitamin D deficiency, unspecified: Secondary | ICD-10-CM

## 2021-05-25 DIAGNOSIS — E785 Hyperlipidemia, unspecified: Secondary | ICD-10-CM

## 2021-05-25 HISTORY — DX: Unspecified cataract: H26.9

## 2021-05-25 MED ORDER — HYDROCODONE-ACETAMINOPHEN 5-325 MG PO TABS: 1.0000 | ORAL_TABLET | Freq: Two times a day (BID) | ORAL | 0 refills | Status: DC | PRN

## 2021-05-25 MED ORDER — AMLODIPINE BESYLATE 2.5 MG PO TABS: 2.5000 mg | ORAL_TABLET | Freq: Every day | ORAL | 3 refills | Status: DC

## 2021-05-25 MED ORDER — GABAPENTIN 100 MG PO CAPS: 300.0000 mg | ORAL_CAPSULE | Freq: Three times a day (TID) | ORAL | 11 refills | Status: DC

## 2021-05-25 MED ORDER — DIAZEPAM 2 MG PO TABS: ORAL_TABLET | ORAL | 5 refills | Status: DC

## 2021-05-25 MED ORDER — LEVOTHYROXINE SODIUM 50 MCG PO TABS: 50.0000 ug | ORAL_TABLET | Freq: Every day | ORAL | 3 refills | Status: DC

## 2021-05-25 NOTE — Patient Instructions (Addendum)
[phone] Not available [place] [id number]      Specialties     Obstetrics and Gynecology           Dr. Sherlene Shams   Call and schedule mammogram and bone density 06/21/21 [place]

## 2021-05-25 NOTE — Progress Notes (Signed)
Chief Complaint  Patient presents with   Follow-up   F/u  1. Htn BP controlled on norvasc 2.5 mg qd  2. Lumbar radiculopathy on valium max 4-6 mg with flare and gabapentin 300 mg tid and extra 200 mg with flares and norco 5-325 mg qd to bid with flares 3. Hypothyroidism on levo 50 mcg qd  4. Recurrent uti E chol 04/04/21 cipro she took but did not like the way she felt but will try again in the future if needed then changed to augmentin  Hold urogyn for now disc today   Review of Systems  Constitutional:  Negative for weight loss.  HENT:  Negative for hearing loss.   Eyes:  Negative for blurred vision.  Respiratory:  Negative for shortness of breath.   Cardiovascular:  Negative for chest pain.  Gastrointestinal:  Negative for abdominal pain and blood in stool.  Genitourinary:  Negative for dysuria.  Musculoskeletal:  Negative for falls and joint pain.  Skin:  Negative for rash.  Neurological:  Negative for headaches.  Psychiatric/Behavioral:  Negative for depression.   Past Medical History:  Diagnosis Date   Basal cell carcinoma    left nasal sidewall [facility]/11/[facility] Win[facility]   Carotid artery stenosis    mild L>R 03/26/16 [place]    Diverticulosis    colonoscopy 10/13/16 [place]   Factor 5 Leiden mutation, heterozygous ([place])    Fatty liver    History of blood transfusion    birth Rh factor    Hyperlipidemia    Hypertension    OSA on CPAP    Radiculopathy    Right leg DVT ([place])    2010 s/p back surgery was on Coumadin x 6 months    Thyroid disease    hypothyroidism    Past Surgical History:  Procedure Laterality Date   APPENDECTOMY     2000   BREAST EXCISIONAL BIOPSY Left 2005   for infection   BREAST SURGERY     biospy ? year    MOHS SURGERY  05/11/2019   left nasal side wall bcc Dr. Winifred Olive in [place]    ruptured disc repair     L4/5 0/1749 with complications residual right leg weakness/reduced sensation and DVT in 2010 post surgery    Family  History  Problem Relation Age of Onset   Hypertension Mother    Stroke Mother        in 3s    Heart disease Father    Stroke Father    Cancer Brother        esophageal cancer    Stroke Paternal Grandfather    Other Daughter        chronic back pain nerve sheath abnormal in spine s/p surgery   Breast cancer Paternal Aunt    Hyperlipidemia Other    Social History   Socioeconomic History   Marital status: Married    Spouse name: Not on file   Number of children: Not on file   Years of education: Not on file   Highest education level: Not on file  Occupational History   Not on file  Tobacco Use   Smoking status: Never   Smokeless tobacco: Never  Vaping Use   Vaping Use: Never used  Substance and Sexual Activity   Alcohol use: Yes    Comment: occ   Drug use: Never   Sexual activity: Not on file  Other Topics Concern   Not on file  Social History Narrative  BSN CCM, RN   Moved from [place] recently    2 daughters    Retired and married    No guns, wears seat belt, safe in relationship    Lives at [facility] Strain: Low Risk    Difficulty of Paying Living Expenses: Not hard at all  Food Insecurity: No Food Insecurity   Worried About Charity fundraiser in the Last Year: Never true   Arboriculturist in the Last Year: Never true  Transportation Needs: No Transportation Needs   Lack of Transportation (Medical): No   Lack of Transportation (Non-Medical): No  Physical Activity: Sufficiently Active   Days of Exercise per Week: 7 days   Minutes of Exercise per Session: 60 min  Stress: No Stress Concern Present   Feeling of Stress : Not at all  Social Connections: Unknown   Frequency of Communication with Friends and Family: More than three times a week   Frequency of Social Gatherings with Friends and Family: Not on file   Attends Religious Services: Not on Electrical engineer or Organizations: Not  on file   Attends Archivist Meetings: Not on file   Marital Status: Married  Human resources officer Violence: Not At Risk   Fear of Current or Ex-Partner: No   Emotionally Abused: No   Physically Abused: No   Sexually Abused: No   Current Meds  Medication Sig   ALPHA LIPOIC ACID PO Take 250 mg by mouth.   amLODipine (NORVASC) 2.5 MG tablet Take 1 tablet (2.5 mg total) by mouth daily. In am if BP>140/?90 take another tablet   Ascorbic Acid (VITAMIN C) 1000 MG tablet Take 1,000 mg by mouth 3 (three) times daily.   Cholecalciferol (VITAMIN D3) 5000 units CAPS Take by mouth.   MAGNESIUM MALATE PO Take 625 mg by mouth.   Nattokinase 100 MG CAPS Take 100 mg by mouth daily.   Strontium Chloride POWD Use as directed 500 mg in the mouth or throat.    [DISCONTINUED] diazepam (VALIUM) 2 MG tablet 1/2 pill to 1 pill qhs prn   [DISCONTINUED] gabapentin (NEURONTIN) 100 MG capsule Take 3 capsules (300 mg total) by mouth 3 (three) times daily.   [DISCONTINUED] HYDROcodone-acetaminophen (NORCO/VICODIN) 5-325 MG tablet Take 1 tablet by mouth 2 (two) times daily as needed (only for flare ups).   [DISCONTINUED] levothyroxine (SYNTHROID) 50 MCG tablet Take 1 tablet (50 mcg total) by mouth daily before breakfast.   Allergies  Allergen Reactions   Macrobid [Nitrofurantoin Macrocrystal]     Nausea and sob    Sulfa Antibiotics     Swelling     Recent Results (from the past 2160 hour(s))  POCT Urinalysis Dipstick     Status: None   Collection Time: 04/12/21 11:54 AM  Result Value Ref Range   Color, UA Yellow    Clarity, UA Clear    Glucose, UA Negative Negative   Bilirubin, UA Negative    Ketones, UA Negative    Spec Grav, UA 1.010 1.010 - 1.025   Blood, UA Negative    pH, UA 7.0 5.0 - 8.0   Protein, UA Negative Negative   Urobilinogen, UA 0.2 0.2 or 1.0 E.U./dL   Nitrite, UA Negative    Leukocytes, UA Negative Negative   Appearance     Odor    Urine Microscopic Only     Status: None  Collection Time: 04/12/21 11:58 AM  Result Value Ref Range   WBC, UA 0-2/hpf 0-2/hpf   RBC / HPF none seen 0-2/hpf  CBC with Differential/Platelet     Status: None   Collection Time: 04/12/21 11:58 AM  Result Value Ref Range   WBC 6.9 4.0 - 10.5 K/uL   RBC 4.30 3.87 - 5.11 Mil/uL   Hemoglobin 13.3 12.0 - 15.0 g/dL   HCT 39.9 36.0 - 46.0 %   MCV 92.8 78.0 - 100.0 fl   MCHC 33.4 30.0 - 36.0 g/dL   RDW 12.7 11.5 - 15.5 %   Platelets 277.0 150.0 - 400.0 K/uL   Neutrophils Relative % 49.7 43.0 - 77.0 %   Lymphocytes Relative 40.7 12.0 - 46.0 %   Monocytes Relative 7.2 3.0 - 12.0 %   Eosinophils Relative 1.4 0.0 - 5.0 %   Basophils Relative 1.0 0.0 - 3.0 %   Neutro Abs 3.4 1.4 - 7.7 K/uL   Lymphs Abs 2.8 0.7 - 4.0 K/uL   Monocytes Absolute 0.5 0.1 - 1.0 K/uL   Eosinophils Absolute 0.1 0.0 - 0.7 K/uL   Basophils Absolute 0.1 0.0 - 0.1 K/uL  Comprehensive metabolic panel     Status: Abnormal   Collection Time: 04/12/21 11:58 AM  Result Value Ref Range   Sodium 133 (L) 135 - 145 mEq/L   Potassium 4.1 3.5 - 5.1 mEq/L   Chloride 101 96 - 112 mEq/L   CO2 24 19 - 32 mEq/L   Glucose, Bld 98 70 - 99 mg/dL   BUN 14 6 - 23 mg/dL   Creatinine, Ser 0.75 0.40 - 1.20 mg/dL   Total Bilirubin 0.5 0.2 - 1.2 mg/dL   Alkaline Phosphatase 80 39 - 117 U/L   AST 19 0 - 37 U/L   ALT 23 0 - 35 U/L   Total Protein 7.8 6.0 - 8.3 g/dL   Albumin 4.5 3.5 - 5.2 g/dL   GFR 80.22 >60.00 mL/min    Comment: Calculated using the CKD-EPI Creatinine Equation (2021)   Calcium 9.4 8.4 - 10.5 mg/dL  CULTURE, URINE COMPREHENSIVE     Status: None   Collection Time: 04/12/21 11:58 AM  Result Value Ref Range   MICRO NUMBER: [id number]    SPECIMEN QUALITY: Adequate    Source NOT GIVEN    STATUS: FINAL    RESULT: No Growth   Comprehensive metabolic panel     Status: None   Collection Time: 05/01/21 10:18 AM  Result Value Ref Range   Sodium 138 135 - 145 mEq/L   Potassium 4.4 3.5 - 5.1 mEq/L   Chloride 104 96 - 112  mEq/L   CO2 27 19 - 32 mEq/L   Glucose, Bld 89 70 - 99 mg/dL   BUN 17 6 - 23 mg/dL   Creatinine, Ser 0.72 0.40 - 1.20 mg/dL   Total Bilirubin 0.4 0.2 - 1.2 mg/dL   Alkaline Phosphatase 69 39 - 117 U/L   AST 18 0 - 37 U/L   ALT 20 0 - 35 U/L   Total Protein 7.0 6.0 - 8.3 g/dL   Albumin 4.3 3.5 - 5.2 g/dL   GFR 84.22 >60.00 mL/min    Comment: Calculated using the CKD-EPI Creatinine Equation (2021)   Calcium 9.5 8.4 - 10.5 mg/dL   Objective  Body mass index is 27.82 kg/m. Wt Readings from Last 3 Encounters:  05/25/21 167 lb 3.2 oz (75.8 kg)  04/12/21 162 lb 6.4 oz (73.7 kg)  11/23/20  154 lb 3.2 oz (69.9 kg)   Temp Readings from Last 3 Encounters:  05/25/21 (!) 96.3 F (35.7 C) (Temporal)  04/12/21 (!) 97.5 F (36.4 C)  11/23/20 98.7 F (37.1 C) (Oral)   BP Readings from Last 3 Encounters:  05/25/21 138/80  04/12/21 132/80  11/23/20 134/70   Pulse Readings from Last 3 Encounters:  05/25/21 82  04/12/21 80  11/23/20 71    Physical Exam Vitals and nursing note reviewed.  Constitutional:      Appearance: Normal appearance. She is well-developed and well-groomed.  HENT:     Head: Normocephalic and atraumatic.  Eyes:     Conjunctiva/sclera: Conjunctivae normal.     Pupils: Pupils are equal, round, and reactive to light.  Cardiovascular:     Rate and Rhythm: Normal rate and regular rhythm.     Heart sounds: Normal heart sounds. No murmur heard. Pulmonary:     Effort: Pulmonary effort is normal.     Breath sounds: Normal breath sounds.  Abdominal:     General: Abdomen is flat. Bowel sounds are normal.     Tenderness: There is no abdominal tenderness.  Musculoskeletal:        General: No tenderness.  Skin:    General: Skin is warm and dry.  Neurological:     General: No focal deficit present.     Mental Status: She is alert and oriented to person, place, and time. Mental status is at baseline.     Cranial Nerves: Cranial nerves 2-12 are intact.     Gait: Gait  is intact.  Psychiatric:        Attention and Perception: Attention and perception normal.        Mood and Affect: Mood and affect normal.        Speech: Speech normal.        Behavior: Behavior normal. Behavior is cooperative.        Thought Content: Thought content normal.        Cognition and Memory: Cognition and memory normal.        Judgment: Judgment normal.    Assessment  Plan  Recurrent UTI - Plan: Urine Culture  Muscle spasm - Plan: diazepam (VALIUM) 2 MG tablet See HPI   Lumbar radiculopathy - Plan: gabapentin (NEURONTIN) 300 MG capsule tid addes 200 mg qd   Other chronic pain - Plan: HYDROcodone-acetaminophen (NORCO/VICODIN) 5-325 MG tablet qd to bid   Hypothyroidism, unspecified type - Plan: levothyroxine (SYNTHROID) 50 MCG tablet, TSH  Cataract of left eye, unspecified cataract type  Essential hypertension - Plan: Comprehensive metabolic panel, Lipid panel, CBC with Differential/Platelet On norvasc 2.5 mg qd   Hyperlipidemia, unspecified hyperlipidemia type - Plan: Comprehensive metabolic panel, Lipid panel, CBC with Differential/Platelet  Prediabetes - Plan: Hemoglobin A1c  Vitamin D deficiency - Plan: Vitamin D (25 hydroxy)   HM   Declined, prevnar, pna 23 vaccine in the past reassess in future Flu vaccine had 02/18/20  Had zostervax 2012 shingrix 2/2 had 2nd dose which caused flare of lumbar radiculopathy  Pfizer 4/4 Declines pna vaccines Tdap had 06/29/15 Hep c neg 01/10/17 A1C 5.5 01/10/17    Out of age window pap h/o abnormal pap in 20s but normal since then   Colonoscopy had 10/13/16 diverticulosis reviewed letter will need report in future -Colonoscopy 10/13/16 diverticulosis [facility] Dr. Cathlean Cower fax # [phone]    Mammogram had 01/28/19 normal wait until 06/21/21 per pt request with DEXA ordered  Call to  sch   DEXA 05/2017 (h/o osteopenia hip and osteoporosis spine) get records prior PCP -per pt this is why taking Strontium  5000 mg qd and had helped with osteoporosis improved to osteopenia over 3 years  -DEXA -3.0 left radius ostepenia lumbar/hip 06/22/2019 wait 2 years for repeat  -declines pharm tx    Dr. Santiago Glad tbse h/o BCC forehead and SCC nose needs tbse appt sch 02/03/18 no bxs normal exam appt 2022 had utd    eye exam in the future h/o flashes of light left eye resolved but negative retina issues/detachment per pt will see eye exam yearly Left eye cataract  Mohs surgical site 05/11/19 trouble hearing      Never smoker    Life line screening 03/26/16 left mild CAS No Afib Nl ABI TC 298, HDL 52, LDL 212, TG 170 declines statin Echo had in 2014 normal   Has MOST form     Provider: Dr. Olivia Mackie McLean-Scocuzza-[facility]          Provider: Dr. Olivia Mackie McLean-[facility]oc[facility]

## 2021-05-26 LAB — URINE CULTURE
MICRO NUMBER:: 12808801
SPECIMEN QUALITY:: ADEQUATE

## 2021-06-22 ENCOUNTER — Ambulatory Visit: Payer: Medicare Other

## 2021-07-10 ENCOUNTER — Other Ambulatory Visit: Payer: Self-pay

## 2021-07-10 ENCOUNTER — Ambulatory Visit
Admission: RE | Admit: 2021-07-10 | Discharge: 2021-07-10 | Disposition: A | Discharge status: Home / Self Care | Payer: Medicare Other | Source: Ambulatory Visit | Attending: Internal Medicine | Admitting: Internal Medicine

## 2021-07-10 DIAGNOSIS — Z1231 Encounter for screening mammogram for malignant neoplasm of breast: Secondary | ICD-10-CM

## 2021-07-10 DIAGNOSIS — M810 Age-related osteoporosis without current pathological fracture: Secondary | ICD-10-CM | POA: Diagnosis present

## 2021-07-10 IMAGING — MG MM DIGITAL SCREENING BILAT W/ TOMO AND CAD
8 series · 8 of 24 positions shown · non-contrast
Comparison: Previous exam(s).

CLINICAL DATA: Screening.

EXAM:
DIGITAL SCREENING BILATERAL MAMMOGRAM WITH TOMOSYNTHESIS AND CAD
TECHNIQUE: Bilateral screening digital craniocaudal and mediolateral oblique
mammograms were obtained. Bilateral screening digital breast
tomosynthesis was performed. The images were evaluated with
computer-aided detection.

[L CC synth-2D]
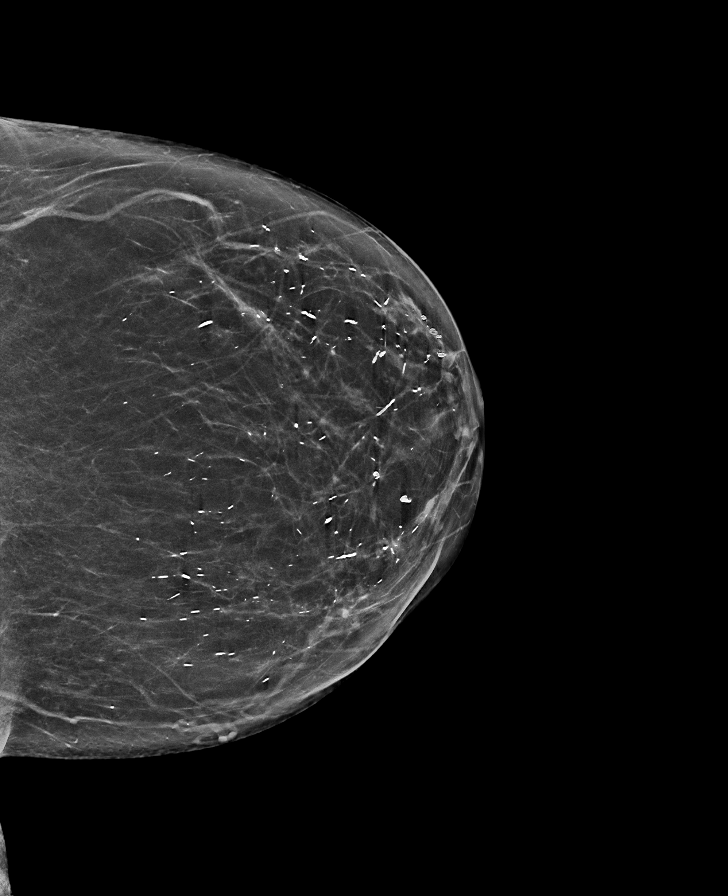

[L MLO synth-2D]
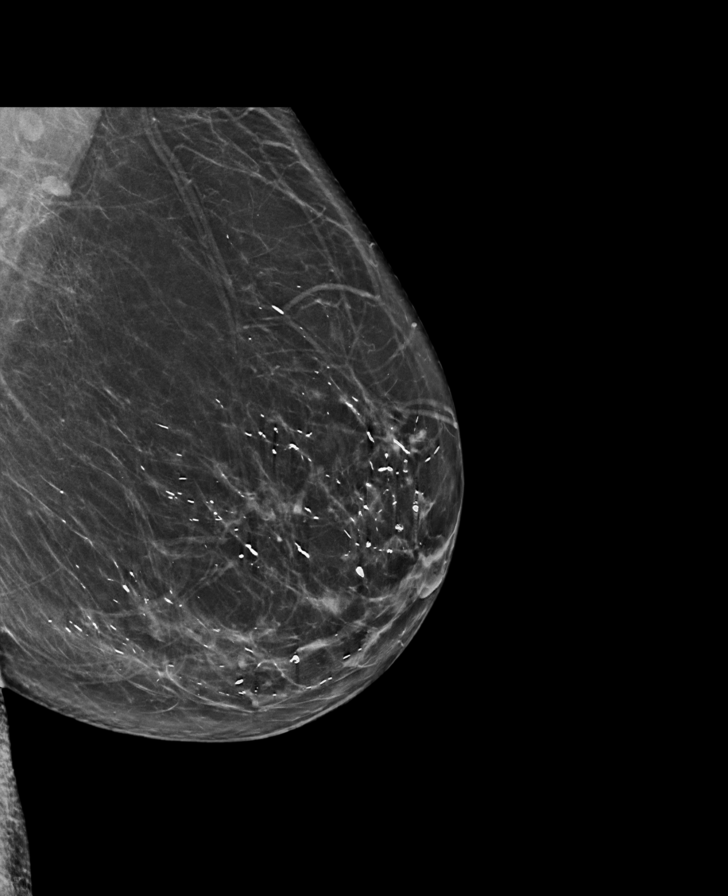

[R MLO synth-2D]
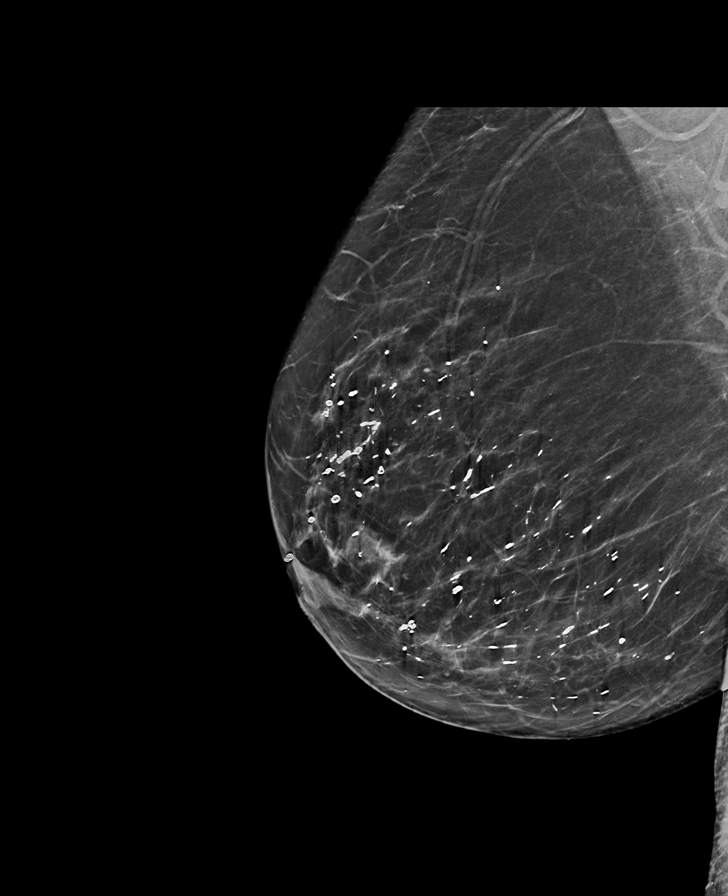

[R CC synth-2D]
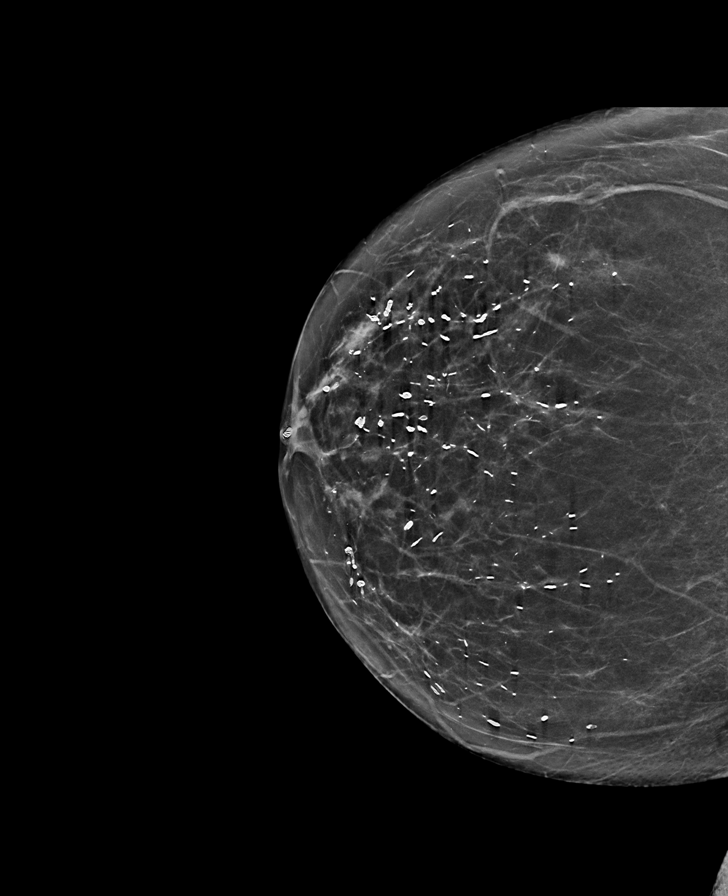

[L MLO tomo · tomo slice 37/74.0]
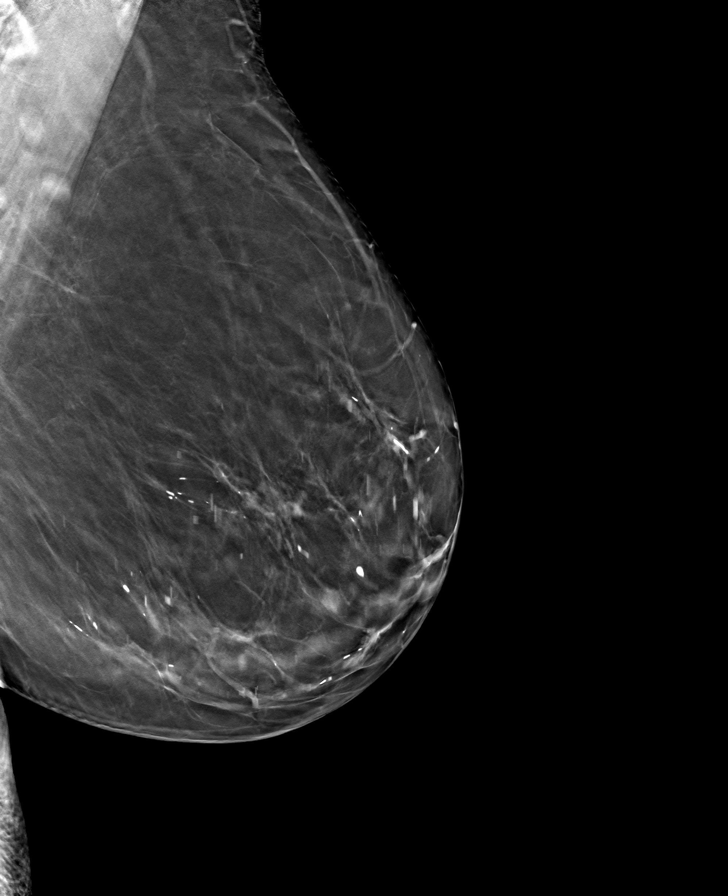

[R CC tomo · tomo slice 34/67.0]
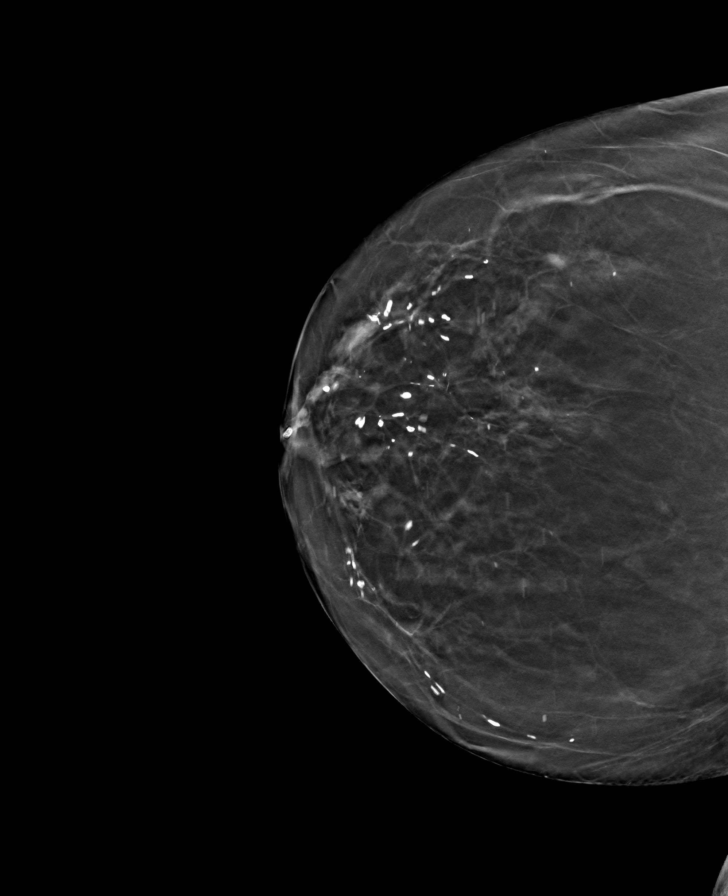

[L CC tomo · tomo slice 36/71.0]
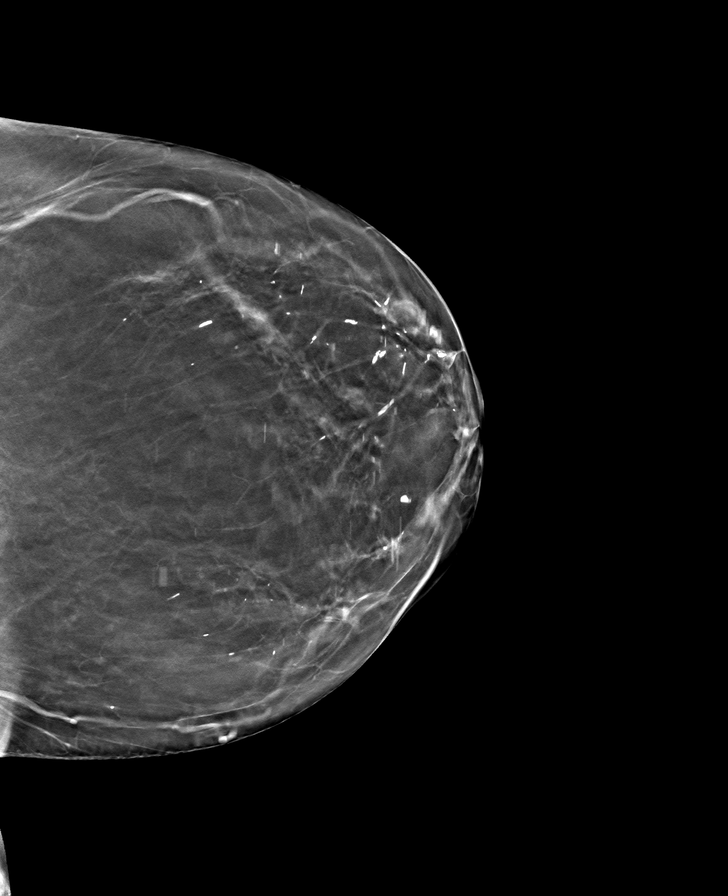

[R MLO tomo · tomo slice 38/75.0]
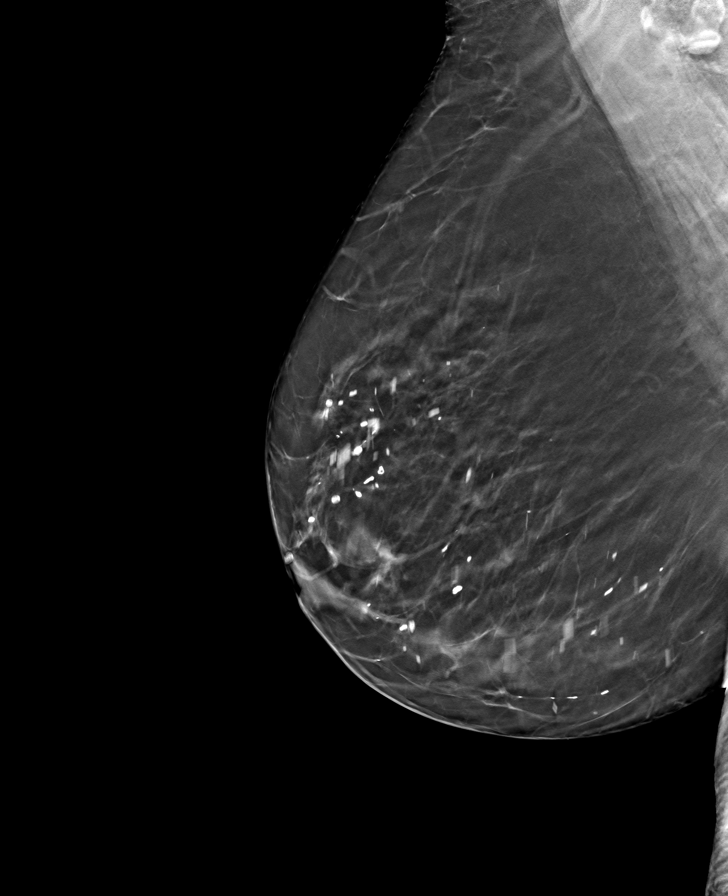

[8 of 24 positions shown; findings below may reference images not displayed]

ACR Breast Density Category b: There are scattered areas of
fibroglandular density.
FINDINGS: There are no findings suspicious for malignancy.
IMPRESSION: No mammographic evidence of malignancy. A result letter of this
screening mammogram will be mailed directly to the patient.

RECOMMENDATION:
Screening mammogram in one year. (Code:[BY])

BI-RADS CATEGORY  1: Negative.

## 2021-07-21 ENCOUNTER — Other Ambulatory Visit: Payer: Self-pay

## 2021-07-21 ENCOUNTER — Ambulatory Visit (INDEPENDENT_AMBULATORY_CARE_PROVIDER_SITE_OTHER): Payer: Medicare Other | Admitting: Internal Medicine

## 2021-07-21 ENCOUNTER — Encounter: Payer: Self-pay | Admitting: Internal Medicine

## 2021-07-21 VITALS — BP 134/88 | HR 86 | Temp 98.0°F | Resp 16 | Ht 60.0 in | Wt 166.5 lb

## 2021-07-21 DIAGNOSIS — R197 Diarrhea, unspecified: Secondary | ICD-10-CM | POA: Diagnosis not present

## 2021-07-21 NOTE — Progress Notes (Signed)
Chief Complaint  Patient presents with   Diarrhea    Off and on X 2 weeks no strong odor , brown to tan in color, watery but after urinating has pencil shaped stool, Afebrile.   Fu with husband  1. Diarrhea x 3 episodes yesterday brow liquid at times tan on and off x 2 weeks no fever, ab pain/cramps. Son in law cooked for them Sunday and husband had diarrhea following. She does have h/o Abx use with h/o UTI  Nothing tried  Today had 6 loose stools    Review of Systems  Constitutional:  Negative for weight loss.  HENT:  Negative for hearing loss.   Eyes:  Negative for blurred vision.  Respiratory:  Negative for shortness of breath.   Cardiovascular:  Negative for chest pain.  Gastrointestinal:  Positive for diarrhea. Negative for abdominal pain, blood in stool, nausea and vomiting.  Genitourinary:  Negative for dysuria.  Musculoskeletal:  Negative for falls and joint pain.  Skin:  Negative for rash.  Neurological:  Negative for headaches.  Psychiatric/Behavioral:  Negative for depression.   Past Medical History:  Diagnosis Date   Basal cell carcinoma    left nasal sidewall 05/11/[facility] Dr. Mitkov   Carotid artery stenosis    mild L>R 03/26/16 [place]    Diverticulosis    colonoscopy 10/13/16 [place]   Factor 5 Leiden mutation, heterozygous (HCC)    Fatty liver    History of blood transfusion    birth Rh factor    Hyperlipidemia    Hypertension    OSA on CPAP    Radiculopathy    Right leg DVT (HCC)    2010 s/p back surgery was on Coumadin x 6 months    Thyroid disease    hypothyroidism    Past Surgical History:  Procedure Laterality Date   APPENDECTOMY     20 00   BREAST EXCISIONAL BIOPSY Left 2005   for infection   BREAST SURGERY     biospy ? year    MOHS SURGERY  05/11/2019   left nasal side wall bcc Dr. Winifred Olive in [place]    ruptured disc repair     L4/5 06/7739 with complications residual right leg weakness/reduced sensation and DVT in 2010 post  surgery    Family History  Problem Relation Age of Onset   Hypertension Mother    Stroke Mother        in 36s    Heart disease Father    Stroke Father    Cancer Brother        esophageal cancer    Stroke Paternal Grandfather    Other Daughter        chronic back pain nerve sheath abnormal in spine s/p surgery   Breast cancer Paternal Aunt    Hyperlipidemia Other    Social History   Socioeconomic History   Marital status: Married    Spouse name: Not on file   Number of children: Not on file   Years of education: Not on file   Highest education level: Not on file  Occupational History   Not on file  Tobacco Use   Smoking status: Never   Smokeless tobacco: Never  Vaping Use   Vaping Use: Never used  Substance and Sexual Activity   Alcohol use: Yes    Comment: occ   Drug use: Never   Sexual activity: Not on file  Other Topics Concern   Not on file  Social History Narrative  BSN CCM, RN   Moved from [place] recently    2 daughters    Retired and married    No guns, wears seat belt, safe in relationship    Lives at [place] Strain: Not on file  Food Insecurity: Not on file  Transportation Needs: Not on file  Physical Activity: Not on file  Stress: Not on file  Social Connections: Not on file  Intimate Partner Violence: Not on file   Current Meds  Medication Sig   ALPHA LIPOIC ACID PO Take 250 mg by mouth.   amLODipine (NORVASC) 2.5 MG tablet Take 1 tablet (2.5 mg total) by mouth daily. In am if BP>140/?90 take another tablet   Ascorbic Acid (VITAMIN C) 1000 MG tablet Take 1,000 mg by mouth 3 (three) times daily.   Cholecalciferol (VITAMIN D3) 5000 units CAPS Take by mouth.   diazepam (VALIUM) 2 MG tablet 1/2 pill to 1 pill qhs prn   gabapentin (NEURONTIN) 100 MG capsule Take 3 capsules (300 mg total) by mouth 3 (three) times daily.   HYDROcodone-acetaminophen (NORCO/VICODIN) 5-325 MG tablet Take 1  tablet by mouth 2 (two) times daily as needed (only for flare ups).   levothyroxine (SYNTHROID) 50 MCG tablet Take 1 tablet (50 mcg total) by mouth daily before breakfast.   Nattokinase 100 MG CAPS Take 100 mg by mouth daily.   Strontium Chloride POWD Use as directed 500 mg in the mouth or throat.    Allergies  Allergen Reactions   Macrobid [Nitrofurantoin Macrocrystal]     Nausea and sob    Sulfa Antibiotics     Swelling     Recent Results (from the past 2160 hour(s))  Comprehensive metabolic panel     Status: None   Collection Time: 05/01/21 10:18 AM  Result Value Ref Range   Sodium 138 135 - 145 mEq/L   Potassium 4.4 3.5 - 5.1 mEq/L   Chloride 104 96 - 112 mEq/L   CO2 27 19 - 32 mEq/L   Glucose, Bld 89 70 - 99 mg/dL   BUN 17 6 - 23 mg/dL   Creatinine, Ser 0.72 0.40 - 1.20 mg/dL   Total Bilirubin 0.4 0.2 - 1.2 mg/dL   Alkaline Phosphatase 69 39 - 117 U/L   AST 18 0 - 37 U/L   ALT 20 0 - 35 U/L   Total Protein 7.0 6.0 - 8.3 g/dL   Albumin 4.3 3.5 - 5.2 g/dL   GFR 84.22 >60.00 mL/min    Comment: Calculated using the CKD-EPI Creatinine Equation (2021)   Calcium 9.5 8.4 - 10.5 mg/dL  Urine Culture     Status: None   Collection Time: 05/25/21  8:42 AM   Specimen: Urine  Result Value Ref Range   MICRO NUMBER: [id number]    SPECIMEN QUALITY: Adequate    Sample Source NOT GIVEN    STATUS: FINAL    ISOLATE 1:      Mixed genital flora isolated. These superficial bacteria are not indicative of a urinary tract infection. No further organism identification is warranted on this specimen. If clinically indicated, recollect clean-catch, mid-stream urine and transfer  immediately to Urine Culture Transport Tube.    Objective  Body mass index is 32.52 kg/m. Wt Readings from Last 3 Encounters:  07/21/21 166 lb 8 oz (75.5 kg)  05/25/21 167 lb 3.2 oz (75.8 kg)  04/12/21 162 lb 6.4 oz (73.7 kg)   Temp Readings  from Last 3 Encounters:  07/21/21 98 F (36.7 C) (Oral)  05/25/21 (!)  96.3 F (35.7 C) (Temporal)  04/12/21 (!) 97.5 F (36.4 C)   BP Readings from Last 3 Encounters:  07/21/21 134/88  05/25/21 138/80  04/12/21 132/80   Pulse Readings from Last 3 Encounters:  07/21/21 86  05/25/21 82  04/12/21 80    Physical Exam Vitals and nursing note reviewed.  Constitutional:      Appearance: Normal appearance. She is well-developed and well-groomed.  HENT:     Head: Normocephalic and atraumatic.  Eyes:     Conjunctiva/sclera: Conjunctivae normal.     Pupils: Pupils are equal, round, and reactive to light.  Cardiovascular:     Rate and Rhythm: Normal rate and regular rhythm.     Heart sounds: Normal heart sounds. No murmur heard. Pulmonary:     Effort: Pulmonary effort is normal.     Breath sounds: Normal breath sounds.  Abdominal:     General: Abdomen is flat. Bowel sounds are normal.     Tenderness: There is no abdominal tenderness.     Comments: Lower ab pressure w/o pain   Musculoskeletal:        General: No tenderness.  Skin:    General: Skin is warm and dry.  Neurological:     General: No focal deficit present.     Mental Status: She is alert and oriented to person, place, and time. Mental status is at baseline.     Cranial Nerves: Cranial nerves 2-12 are intact.     Motor: Motor function is intact.     Coordination: Coordination is intact.     Gait: Gait is intact.  Psychiatric:        Attention and Perception: Attention and perception normal.        Mood and Affect: Mood and affect normal.        Speech: Speech normal.        Behavior: Behavior normal. Behavior is cooperative.        Thought Content: Thought content normal.        Cognition and Memory: Cognition and memory normal.        Judgment: Judgment normal.    Assessment  Plan  Diarrhea, unspecified type - Plan: C Difficile Quick Screen w PCR reflex  If does not resolve will refer to GI and can test further O&P, GI path panel but due to costs of these test will hold for  now  Peptobismol  Zero sugar gatorade  Bananas Rice  Broth Applesauce  Crackers  Toast  Oatmeal  Eggs   Align or culturelle    Provider: Dr. Olivia Mackie McLean-Scocuzza-Internal Medicine

## 2021-07-21 NOTE — Patient Instructions (Signed)
Peptobismol  Zero sugar gatorade  Bananas Rice  Broth Applesauce  Crackers  Toast  Oatmeal  Eggs   Align or culturelle    Go through medical mall at hospital lab   [place] Diet A bland diet consists of foods that are often soft and do not have a lot of fat, fiber, or extra seasonings. Foods without fat, fiber, or seasoning are easier for the body to digest. They are also less likely to irritate your mouth, throat, stomach, and other parts of your digestive system. A bland diet is sometimes called a BRAT diet. What is my plan? Your health care provider or food and nutrition specialist (dietitian) may recommend specific changes to your diet to prevent symptoms or to treat your symptoms. These changes may include: Eating small meals often. Cooking food until it is soft enough to chew easily. Chewing your food well. Drinking fluids slowly. Not eating foods that are very spicy, sour, or fatty. Not eating citrus fruits, such as oranges and grapefruit. What do I need to know about this diet? Eat a variety of foods from the bland diet food list. Do not follow a bland diet longer than needed. Ask your health care provider whether you should take vitamins or supplements. What foods can I eat? Grains Hot cereals, such as cream of wheat. Rice. Bread, crackers, or tortillas made from refined white flour. Vegetables Canned or cooked vegetables. Mashed or boiled potatoes. Fruits Bananas. Applesauce. Other types of cooked or canned fruit with the skin and seeds removed, such as canned peaches or pears. Meats and other proteins Scrambled eggs. Creamy peanut butter or other nut butters. Lean, well-cooked meats, such as chicken or fish. Tofu. Soups or broths. Dairy Low-fat dairy products, such as milk, cottage cheese, or yogurt. Beverages Water. Herbal tea. Apple juice. Fats and oils Mild salad dressings. Canola or olive oil. Sweets and desserts Pudding. Custard. Fruit gelatin. Ice  cream. The items listed above may not be a complete list of recommended foods and beverages. Contact a dietitian for more options. What foods are not recommended? Grains Whole grain breads and cereals. Vegetables Raw vegetables. Fruits Raw fruits, especially citrus, berries, or dried fruits. Dairy Whole fat dairy foods. Beverages Caffeinated drinks. Alcohol. Seasonings and condiments Strongly flavored seasonings or condiments. Hot sauce. Salsa. Other foods Spicy foods. Fried foods. Sour foods, such as pickled or fermented foods. Foods with high sugar content. Foods high in fiber. The items listed above may not be a complete list of foods and beverages to avoid. Contact a dietitian for more information. Summary A bland diet consists of foods that are often soft and do not have a lot of fat, fiber, or extra seasonings. Foods without fat, fiber, or seasoning are easier for the body to digest. Check with your health care provider to see how long you should follow this diet plan. It is not meant to be followed for long periods. This information is not intended to replace advice given to you by your health care provider. Make sure you discuss any questions you have with your health care provider. Document Revised: 06/12/2017 Document Reviewed: 06/12/2017 Elsevier Patient Education  [place].  Diarrhea, Adult Diarrhea is frequent loose and watery bowel movements. Diarrhea can make you feel weak and cause you to become dehydrated. Dehydration can make you tired and thirsty, cause you to have a dry mouth, and decrease how often you urinate. Diarrhea typically lasts 2-3 days. However, it can last longer if it is a  sign of something more serious. It is important to treat your diarrhea as told by your health care provider. Follow these instructions at home: Eating and drinking   Follow these recommendations as told by your health care provider: Take an oral rehydration solution (ORS).  This is an over-the-counter medicine that helps return your body to its normal balance of nutrients and water. It is found at pharmacies and retail stores. Drink plenty of fluids, such as water, ice chips, diluted fruit juice, and low-calorie sports drinks. You can drink milk also, if desired. Avoid drinking fluids that contain a lot of sugar or caffeine, such as energy drinks, sports drinks, and soda. Eat bland, easy-to-digest foods in small amounts as you are able. These foods include bananas, applesauce, rice, lean meats, toast, and crackers. Avoid alcohol. Avoid spicy or fatty foods.  Medicines Take over-the-counter and prescription medicines only as told by your health care provider. If you were prescribed an antibiotic medicine, take it as told by your health care provider. Do not stop using the antibiotic even if you start to feel better. General instructions  Wash your hands often using soap and water. If soap and water are not available, use a hand sanitizer. Others in the household should wash their hands as well. Hands should be washed: After using the toilet or changing a diaper. Before preparing, cooking, or serving food. While caring for a sick person or while visiting someone in a hospital. Drink enough fluid to keep your urine pale yellow. Rest at home while you recover. Watch your condition for any changes. Take a warm bath to relieve any burning or pain from frequent diarrhea episodes. Keep all follow-up visits as told by your health care provider. This is important. Contact a health care provider if: You have a fever. Your diarrhea gets worse. You have new symptoms. You cannot keep fluids down. You feel light-headed or dizzy. You have a headache. You have muscle cramps. Get help right away if: You have chest pain. You feel extremely weak or you faint. You have bloody or black stools or stools that look like tar. You have severe pain, cramping, or bloating in your  abdomen. You have trouble breathing or you are breathing very quickly. Your heart is beating very quickly. Your skin feels cold and clammy. You feel confused. You have signs of dehydration, such as: Dark urine, very little urine, or no urine. Cracked lips. Dry mouth. Sunken eyes. Sleepiness. Weakness. Summary Diarrhea is frequent loose and sometimes watery bowel movements. Diarrhea can make you feel weak and cause you to become dehydrated. Drink enough fluids to keep your urine pale yellow. Make sure that you wash your hands after using the toilet. If soap and water are not available, use hand sanitizer. Contact a health care provider if your diarrhea gets worse or you have new symptoms. Get help right away if you have signs of dehydration. This information is not intended to replace advice given to you by your health care provider. Make sure you discuss any questions you have with your health care provider. Document Revised: 11/23/2020 Document Reviewed: 11/23/2020 Elsevier Patient Education  [place].

## 2021-07-22 ENCOUNTER — Other Ambulatory Visit
Admission: RE | Admit: 2021-07-22 | Discharge: 2021-07-22 | Disposition: A | Discharge status: Home / Self Care | Payer: Medicare Other | Source: Ambulatory Visit | Attending: Internal Medicine | Admitting: Internal Medicine

## 2021-07-22 DIAGNOSIS — R197 Diarrhea, unspecified: Secondary | ICD-10-CM | POA: Diagnosis present

## 2021-07-22 LAB — C DIFFICILE QUICK SCREEN W PCR REFLEX
C Diff antigen: NEGATIVE
C Diff interpretation: NEGATIVE
C Diff toxin: NEGATIVE

## 2021-09-05 ENCOUNTER — Other Ambulatory Visit: Payer: Self-pay | Admitting: Internal Medicine

## 2021-09-05 DIAGNOSIS — G8929 Other chronic pain: Secondary | ICD-10-CM

## 2021-09-06 ENCOUNTER — Other Ambulatory Visit: Payer: Self-pay | Admitting: Internal Medicine

## 2021-09-06 DIAGNOSIS — G8929 Other chronic pain: Secondary | ICD-10-CM

## 2021-09-06 MED ORDER — HYDROCODONE-ACETAMINOPHEN 5-325 MG PO TABS: 1.0000 | ORAL_TABLET | Freq: Two times a day (BID) | ORAL | 0 refills | Status: DC | PRN

## 2021-09-13 ENCOUNTER — Ambulatory Visit (INDEPENDENT_AMBULATORY_CARE_PROVIDER_SITE_OTHER): Payer: Medicare Other

## 2021-09-13 VITALS — Ht 60.0 in | Wt 166.0 lb

## 2021-09-13 DIAGNOSIS — Z0000 Encounter for general adult medical examination without abnormal findings: Secondary | ICD-10-CM

## 2021-09-13 NOTE — Progress Notes (Signed)
Subjective:   Tonya Green is a 70 y.o. female who presents for Medicare Annual (Subsequent) preventive examination.  Review of Systems    No ROS.  Medicare Wellness Virtual Visit.  Visual/audio telehealth visit, UTA vital signs.   See social history for additional risk factors.         Objective:    Today's Vitals   09/13/21 0819  Weight: 166 lb (75.3 kg)   Body mass index is 32.42 kg/m.     08/05/2020    5:24 AM 08/05/2020    5:23 AM 06/21/2020    1:30 PM 06/19/2019    1:07 PM 11/15/2018    4:58 AM 07/23/2018   12:27 PM 06/17/2018    8:46 AM  Advanced Directives  Does Patient Have a Medical Advance Directive? Yes No Yes Yes Yes Yes Yes  Type of Advance Directive Out of facility DNR (pink MOST or yellow form);Healthcare Power of [place];Living will  Healthcare Power of [place];Living will Out of facility DNR (pink MOST or yellow form)   Healthcare Power of [place];Living will  Does patient want to make changes to medical advance directive?   No - Patient declined No - Patient declined   No - Patient declined  Copy of Healthcare Power of Attorney in Chart?   Yes - validated most recent copy scanned in chart (See row information) Yes - validated most recent copy scanned in chart (See row information)   Yes - validated most recent copy scanned in chart (See row information)    Current Medications (verified) Outpatient Encounter Medications as of 09/13/2021  Medication Sig   ALPHA LIPOIC ACID PO Take 250 mg by mouth.   amLODipine (NORVASC) 2.5 MG tablet Take 1 tablet (2.5 mg total) by mouth daily. In am if BP>140/?90 take another tablet   Ascorbic Acid (VITAMIN C) 1000 MG tablet Take 1,000 mg by mouth 3 (three) times daily.   Cholecalciferol (VITAMIN D3) 5000 units CAPS Take by mouth.   diazepam (VALIUM) 2 MG tablet 1/2 pill to 1 pill qhs prn   gabapentin (NEURONTIN) 100 MG capsule Take 3 capsules (300 mg total) by mouth 3 (three) times daily.   HYDROcodone-acetaminophen  (NORCO/VICODIN) 5-325 MG tablet Take 1 tablet by mouth 2 (two) times daily as needed (only for flare ups).   levothyroxine (SYNTHROID) 50 MCG tablet Take 1 tablet (50 mcg total) by mouth daily before breakfast.   MAGNESIUM MALATE PO Take 625 mg by mouth. (Patient not taking: Reported on 07/21/2021)   Nattokinase 100 MG CAPS Take 100 mg by mouth daily.   Strontium Chloride POWD Use as directed 500 mg in the mouth or throat.    No facility-administered encounter medications on file as of 09/13/2021.    Allergies (verified) Macrobid [nitrofurantoin macrocrystal] and Sulfa antibiotics   History: Past Medical History:  Diagnosis Date   Basal cell carcinoma    left nasal sidewall [facility]   Carotid artery stenosis    mild L>R 03/26/16 [place]    Diverticulosis    colonoscopy 10/13/16 [place]   Factor 5 Leiden mutation, heterozygous (HCC)    Fatty liver    History of blood transfusion    birth Rh factor    Hyperlipidemia    Hypertension    OSA on CPAP    Radiculopathy    Right leg DVT (HCC)    2010 s/p back surgery was on Coumadin x 6 months    Thyroid disease    hypothyroidism  Past Surgical History:  Procedure Laterality Date   APPENDECTOMY     2000   BREAST EXCISIONAL BIOPSY Left 2005   for infection   BREAST SURGERY     biospy ? year    MOHS SURGERY  05/11/2019   left nasal side wall bcc Dr. Jeannine Boga in [place]    ruptured disc repair     L4/5 10/2008 with complications residual right leg weakness/reduced sensation and DVT in 2010 post surgery    Family History  Problem Relation Age of Onset   Hypertension Mother    Stroke Mother        in 59s    Heart disease Father    Stroke Father    Cancer Brother        esophageal cancer    Stroke Paternal Grandfather    Other Daughter        chronic back pain nerve sheath abnormal in spine s/p surgery   Breast cancer Paternal Aunt    Hyperlipidemia Other    Social History   Socioeconomic  History   Marital status: Married    Spouse name: Not on file   Number of children: Not on file   Years of education: Not on file   Highest education level: Not on file  Occupational History   Not on file  Tobacco Use   Smoking status: Never   Smokeless tobacco: Never  Vaping Use   Vaping Use: Never used  Substance and Sexual Activity   Alcohol use: Yes    Comment: occ   Drug use: Never   Sexual activity: Not on file  Other Topics Concern   Not on file  Social History Narrative   BSN CCM, RN   Moved from [place] recently    2 daughters    Retired and married    No guns, wears seat belt, safe in relationship    Lives at [employer] [facility]    Social Determinants of Health   Financial Resource Strain: Not on file  Food Insecurity: Not on file  Transportation Needs: Not on file  Physical Activity: Not on file  Stress: Not on file  Social Connections: Not on file    Tobacco Counseling Counseling given: Not Answered   Clinical Intake:     09/13/2021   4540  Pre-visit preparation   Pre-visit preparation completed Yes       Nutrition Screen            Diabetes No  Functional Status   Activities of Daily Living Independent  Ambulation Independent with device- listed below  Home Assistive Devices/Equipment Eyeglasses  Medication Administration Independent  Home Management Independent          Abuse/Neglect   Do you feel unsafe in your current relationship? No  Do you feel physically threatened by others? No  Anyone hurting you at home, work, or school? No  Unable to ask? No  Information provided on Community resources No  Patient Literacy   How often do you need to have someone help you when you read instructions, pamphlets, or other written materials from your doctor or pharmacy? 1 - Never     Web designer Needed? No                      Activities of Daily Living     View : No data to display.           Patient Care Team: McLean-Scocuzza, French Ana  N, MD as PCP - General (Internal Medicine)  Indicate any recent Medical Services you may have received from other than [facility] providers in the past year (date may be approximate).     Assessment:   This is a routine wellness examination for [name].   Virtual Visit via Telephone Note  I connected with  Robinette Haines on 09/13/21 at  8:15 AM EDT by telephone and verified that I am speaking with the correct person using two identifiers.  Persons participating in the virtual visit: patient/Nurse Health Advisor   I discussed the limitations of performing an evaluation and management service by telehealth. The patient expressed understanding and agreed to proceed. We continued and completed visit with audio only. Some vital signs may be absent or patient reported.   Hearing/Vision screen No difficulty with hearing.  Wears corrective lenses.   Dietary issues and exercise activities discussed:  Healthy diet Good water intake   Goals Addressed   Maintain healthy lifestyle with healthy diet and exercise.     Depression Screen No depression to report    07/21/2021    2:29 PM 04/12/2021   11:46 AM 06/21/2020    1:23 PM 06/19/2019    8:45 AM 06/17/2018    8:48 AM 12/18/2017    9:06 AM  PHQ 2/9 Scores  PHQ - 2 Score 0 1 0 0 0 0    Fall Risk No fall in last 12 months    07/21/2021    2:28 PM 04/12/2021   11:46 AM 11/23/2020   11:02 AM 06/21/2020    1:31 PM 03/31/2020    8:59 AM  Fall Risk   Falls in the past year? 0 0 0 0 1  Number falls in past yr: 0 0 0 0 0  Injury with Fall? 0 0 0 0 0  Risk for fall due to : No Fall Risks      Follow up Falls evaluation completed Falls evaluation completed Falls evaluation completed Falls evaluation completed Falls evaluation completed    FALL RISK PREVENTION PERTAINING TO THE HOME: Home free of loose throw rugs in walkways, pet beds, electrical cords, etc? Yes  Adequate lighting in your home to reduce  risk of falls? Yes   ASSISTIVE DEVICES UTILIZED TO PREVENT FALLS: Life alert? Yes  Use of a cane, walker or w/c? No   TIMED UP AND GO: Was the test performed? No .   Cognitive Function:  Patient is alert and oriented x3.       06/19/2019    1:17 PM 06/17/2018    8:50 AM  6CIT Screen  What Year? 0 points 0 points  What month? 0 points 0 points  What time? 0 points 0 points  Count back from 20  0 points  Months in reverse  0 points  Repeat phrase  0 points  Total Score  0 points    Immunizations Immunization History  Administered Date(s) Administered   Influenza, High Dose Seasonal PF 03/18/2018, 02/01/2019, 02/18/2020   Influenza-Unspecified 03/08/2021   Moderna Covid-19 Vaccine Bivalent Booster 26yrs & up 02/07/2021   PFIZER Comirnaty(Gray Top)Covid-19 Tri-Sucrose Vaccine 07/10/2020   PFIZER(Purple Top)SARS-COV-2 Vaccination 06/03/2019, 07/01/2019, 01/17/2020   Tdap 06/29/2015   Zoster Recombinat (Shingrix) 06/30/2018, 10/06/2018, 11/06/2018   Screening Tests Health Maintenance  Topic Date Due   Pneumonia Vaccine 29+ Years old (1 - PCV) 05/25/2022 (Originally 01/28/2015)   INFLUENZA VACCINE  12/26/2021   MAMMOGRAM  07/11/2023   TETANUS/TDAP  06/28/2025   COLONOSCOPY (Pts 45-57yrs  Insurance coverage will need to be confirmed)  10/14/2026   DEXA SCAN  Completed   COVID-19 Vaccine  Completed   Hepatitis C Screening  Completed   Zoster Vaccines- Shingrix  Completed   HPV VACCINES  Aged Out   Health Maintenance There are no preventive care reminders to display for this patient.  Lung Cancer Screening: (Low Dose CT Chest recommended if Age 65-80 years, 30 pack-year currently smoking OR have quit w/in 15years.) does not qualify.   Vision Screening: Recommended annual ophthalmology exams for early detection of glaucoma and other disorders of the eye.  Dental Screening: Recommended annual dental exams for proper oral hygiene. Visit every 4 months.   Community Resource  Referral / Chronic Care Management: CRR required this visit?  No   CCM required this visit?  No      Plan:   Keep all routine maintenance appointments.   I have personally reviewed and noted the following in the patient's chart:   Medical and social history Use of alcohol, tobacco or illicit drugs  Current medications and supplements including opioid prescriptions. Taking hydrocodone, followed by PCP.  Functional ability and status Nutritional status Physical activity Advanced directives List of other physicians Hospitalizations, surgeries, and ER visits in previous 12 months Vitals Screenings to include cognitive, depression, and falls Referrals and appointments  In addition, I have reviewed and discussed with patient certain preventive protocols, quality metrics, and best practice recommendations. A written personalized care plan for preventive services as well as general preventive health recommendations were provided to patient.     Ashok Pall, LPN   1/61/0960

## 2021-09-13 NOTE — Patient Instructions (Addendum)
Tonya Green , Thank you for taking time to come for your Medicare Wellness Visit. I appreciate your ongoing commitment to your health goals. Please review the following plan we discussed and let me know if I can assist you in the future.   These are the goals we discussed:  Goals      Maintain Healthy Lifestyle     Stay active walking as  tolerated Healthy diet        This is a list of the screening recommended for you and due dates:  Health Maintenance  Topic Date Due   Pneumonia Vaccine (1 - PCV) 05/25/2022*   Flu Shot  12/26/2021   Mammogram  07/11/2023   Tetanus Vaccine  06/28/2025   Colon Cancer Screening  10/14/2026   DEXA scan (bone density measurement)  Completed   COVID-19 Vaccine  Completed   Hepatitis C Screening: USPSTF Recommendation to screen - Ages 41-79 yo.  Completed   Zoster (Shingles) Vaccine  Completed   HPV Vaccine  Aged Out  *Topic was postponed. The date shown is not the original due date.    Opioid Pain Medicine Management Opioids are powerful medicines that are used to treat moderate to severe pain. When used for short periods of time, they can help you to: Sleep better. Do better in physical or occupational therapy. Feel better in the first few days after an injury. Recover from surgery. Opioids should be taken with the supervision of a trained health care provider. They should be taken for the shortest period of time possible. This is because opioids can be addictive, and the longer you take opioids, the greater your risk of addiction. This addiction can also be called opioid use disorder. What are the risks? Using opioid pain medicines for longer than 3 days increases your risk of side effects. Side effects include: Constipation. Nausea and vomiting. Breathing difficulties (respiratory depression). Drowsiness. Confusion. Opioid use disorder. Itching. Taking opioid pain medicine for a long period of time can affect your ability to do daily tasks.  It also puts you at risk for: Motor vehicle crashes. Depression. Suicide. Heart attack. Overdose, which can be life-threatening. What is a pain treatment plan? A pain treatment plan is an agreement between you and your health care provider. Pain is unique to each person, and treatments vary depending on your condition. To manage your pain, you and your health care provider need to work together. To help you do this: Discuss the goals of your treatment, including how much pain you might expect to have and how you will manage the pain. Review the risks and benefits of taking opioid medicines. Remember that a good treatment plan uses more than one approach and minimizes the chance of side effects. Be honest about the amount of medicines you take and about any drug or alcohol use. Get pain medicine prescriptions from only one health care provider. Pain can be managed with many types of alternative treatments. Ask your health care provider to refer you to one or more specialists who can help you manage pain through: Physical or occupational therapy. Counseling (cognitive behavioral therapy). Good nutrition. Biofeedback. Massage. Meditation. Non-opioid medicine. Following a gentle exercise program. How to use opioid pain medicine Taking medicine Take your pain medicine exactly as told by your health care provider. Take it only when you need it. If your pain gets less severe, you may take less than your prescribed dose if your health care provider approves. If you are not having pain, do  nottake pain medicine unless your health care provider tells you to take it. If your pain is severe, do nottry to treat it yourself by taking more pills than instructed on your prescription. Contact your health care provider for help. Write down the times when you take your pain medicine. It is easy to become confused while on pain medicine. Writing the time can help you avoid overdose. Take other  over-the-counter or prescription medicines only as told by your health care provider. Keeping yourself and others safe  While you are taking opioid pain medicine: Do not drive, use machinery, or power tools. Do not sign legal documents. Do not drink alcohol. Do not take sleeping pills. Do not supervise children by yourself. Do not do activities that require climbing or being in high places. Do not go to a lake, river, ocean, spa, or swimming pool. Do not share your pain medicine with anyone. Keep pain medicine in a locked cabinet or in a secure area where pets and children cannot reach it. Stopping your use of opioids If you have been taking opioid medicine for more than a few weeks, you may need to slowly decrease (taper) how much you take until you stop completely. Tapering your use of opioids can decrease your risk of symptoms of withdrawal, such as: Pain and cramping in the abdomen. Nausea. Sweating. Sleepiness. Restlessness. Uncontrollable shaking (tremors). Cravings for the medicine. Do not attempt to taper your use of opioids on your own. Talk with your health care provider about how to do this. Your health care provider may prescribe a step-down schedule based on how much medicine you are taking and how long you have been taking it. Getting rid of leftover pills Do not save any leftover pills. Get rid of leftover pills safely by: Taking the medicine to a prescription take-back program. This is usually offered by the county or law enforcement. Bringing them to a pharmacy that has a drug disposal container. Flushing them down the toilet. Check the label or package insert of your medicine to see whether this is safe to do. Throwing them out in the trash. Check the label or package insert of your medicine to see whether this is safe to do. If it is safe to throw it out, remove the medicine from the original container, put it into a sealable bag or container, and mix it with used  coffee grounds, food scraps, dirt, or cat litter before putting it in the trash. Follow these instructions at home: Activity Do exercises as told by your health care provider. Avoid activities that make your pain worse. Return to your normal activities as told by your health care provider. Ask your health care provider what activities are safe for you. General instructions You may need to take these actions to prevent or treat constipation: Drink enough fluid to keep your urine pale yellow. Take over-the-counter or prescription medicines. Eat foods that are high in fiber, such as beans, whole grains, and fresh fruits and vegetables. Limit foods that are high in fat and processed sugars, such as fried or sweet foods. Keep all follow-up visits. This is important. Where to find support If you have been taking opioids for a long time, you may benefit from receiving support for quitting from a local support group or counselor. Ask your health care provider for a referral to these resources in your area. Where to find more information Centers for Disease Control and Prevention (CDC): FootballExhibition.com.br [place] Food and Drug Administration (FDA): PumpkinSearch.com.ee Get  help right away if: You may have taken too much of an opioid (overdosed). Common symptoms of an overdose: Your breathing is slower or more shallow than normal. You have a very slow heartbeat (pulse). You have slurred speech. You have nausea and vomiting. Your pupils become very small. You have other potential symptoms: You are very confused. You faint or feel like you will faint. You have cold, clammy skin. You have blue lips or fingernails. You have thoughts of harming yourself or harming others. These symptoms may represent a serious problem that is an emergency. Do not wait to see if the symptoms will go away. Get medical help right away. Call your local emergency services (911 in the [place] Do not drive yourself to the hospital.  If you  ever feel like you may hurt yourself or others, or have thoughts about taking your own life, get help right away. Go to your nearest emergency department or: Call your local emergency services (911 in the [place] Call the [facility] (604-395-9196 in the [place] Call a suicide crisis helpline, such as the National Suicide Prevention Lifeline at 573 673 2136 or 988 in the [place] This is open 24 hours a day in the [place] Text the Crisis Text Line at (334) 333-2626 (in the [place] Summary Opioid medicines can help you manage moderate to severe pain for a short period of time. A pain treatment plan is an agreement between you and your health care provider. Discuss the goals of your treatment, including how much pain you might expect to have and how you will manage the pain. If you think that you or someone else may have taken too much of an opioid, get medical help right away. This information is not intended to replace advice given to you by your health care provider. Make sure you discuss any questions you have with your health care provider. Document Revised: 12/07/2020 Document Reviewed: 08/24/2020 Elsevier Patient Education  2023 [employer].

## 2021-09-27 ENCOUNTER — Telehealth: Payer: Self-pay | Admitting: Internal Medicine

## 2021-09-27 NOTE — Telephone Encounter (Signed)
Referred to [facility] for diarrhea since 06/2021  ? ?

## 2021-09-28 ENCOUNTER — Encounter: Payer: Self-pay | Admitting: Internal Medicine

## 2021-09-28 ENCOUNTER — Ambulatory Visit (INDEPENDENT_AMBULATORY_CARE_PROVIDER_SITE_OTHER): Payer: Medicare Other | Admitting: Internal Medicine

## 2021-09-28 VITALS — BP 130/84 | HR 77 | Temp 97.9°F | Resp 14 | Ht 60.0 in | Wt 164.4 lb

## 2021-09-28 DIAGNOSIS — R197 Diarrhea, unspecified: Secondary | ICD-10-CM

## 2021-09-28 NOTE — Progress Notes (Signed)
Chief Complaint  ?Patient presents with  ? Acute Visit  ?  Disc about ongoing diarrhea since feb, episodes are on/off more constant. Has to go up to 8 times daily. Denies any N/V or abdominal pain.   ? ?F/u with daughter ?Diarrhea since 06/2021 up to 8 x per day mushy c diff negative 06/2021 no n/v/ab pain or blood in the stool h/o diverticulosis she does have slight llq ab pain Tuesday had 8 episodes of diarrhea  ? ? ?Review of Systems  ?Constitutional:  Negative for weight loss.  ?HENT:  Negative for hearing loss.   ?Eyes:  Negative for blurred vision.  ?Respiratory:  Negative for shortness of breath.   ?Cardiovascular:  Negative for chest pain.  ?Gastrointestinal:  Positive for abdominal pain and diarrhea. Negative for nausea and vomiting.  ?Genitourinary:  Negative for dysuria.  ?Musculoskeletal:  Negative for falls and joint pain.  ?Skin:  Negative for rash.  ?Neurological:  Negative for headaches.  ?Psychiatric/Behavioral:  Negative for depression.   ?Past Medical History:  ?Diagnosis Date  ? Basal cell carcinoma   ? [facility]  ? Carotid artery stenosis   ? mild L>R 03/26/16 [place]   ? Diverticulosis   ? colonoscopy 10/13/16 [place]  ? Factor 5 Leiden mutation, heterozygous ([place])   ? Fatty liver   ? History of blood transfusion   ? birth Rh factor   ? Hyperlipidemia   ? Hypertension   ? OSA on CPAP   ? Radiculopathy   ? Right leg DVT ([place])   ? 2010 s/p back surgery was on Coumadin x 6 months   ? Thyroid disease   ? hypothyroidism   ? ?Past Surgical History:  ?Procedure Laterality Date  ? APPENDECTOMY    ? 2000  ? BREAST EXCISIONAL BIOPSY Left 2005  ? for infection  ? BREAST SURGERY    ? biospy ? year   ? MOHS SURGERY  05/11/2019  ? left nasal side wall bcc Dr. Winifred Olive in [place]   ? ruptured disc repair    ? L4/5 09/1882 with complications residual right leg weakness/reduced sensation and DVT in 2010 post surgery   ? ?Family History  ?Problem Relation Age of  Onset  ? Hypertension Mother   ? Stroke Mother   ?     in 49s   ? Heart disease Father   ? Stroke Father   ? Cancer Brother   ?     esophageal cancer   ? Stroke Paternal Grandfather   ? Other Daughter   ?     chronic back pain nerve sheath abnormal in spine s/p surgery  ? Breast cancer Paternal Aunt   ? Hyperlipidemia Other   ? ?Social History  ? ?Socioeconomic History  ? Marital status: Married  ?  Spouse name: Not on file  ? Number of children: Not on file  ? Years of education: Not on file  ? Highest education level: Not on file  ?Occupational History  ? Not on file  ?Tobacco Use  ? Smoking status: Never  ? Smokeless tobacco: Never  ?Vaping Use  ? Vaping Use: Never used  ?Substance and Sexual Activity  ? Alcohol use: Yes  ?  Comment: occ  ? Drug use: Never  ? Sexual activity: Not on file  ?Other Topics Concern  ? Not on file  ?Social History Narrative  ? BSN CCM, RN  ? Moved from [place] recently   ?  2 daughters   ? Retired and married   ? No guns, wears seat belt, safe in relationship   ? Lives at [employer]   ? ?Social Determinants of Health  ? ?Financial Resource Strain: Low Risk   ? Difficulty of Paying Living Expenses: Not hard at all  ?Food Insecurity: No Food Insecurity  ? Worried About Charity fundraiser in the Last Year: Never true  ? Ran Out of Food in the Last Year: Never true  ?Transportation Needs: No Transportation Needs  ? Lack of Transportation (Medical): No  ? Lack of Transportation (Non-Medical): No  ?Physical Activity: Sufficiently Active  ? Days of Exercise per Week: 7 days  ? Minutes of Exercise per Session: 60 min  ?Stress: No Stress Concern Present  ? Feeling of Stress : Not at all  ?Social Connections: Unknown  ? Frequency of Communication with Friends and Family: More than three times a week  ? Frequency of Social Gatherings with Friends and Family: Not on file  ? Attends Religious Services: Not on file  ? Active Member of Clubs or Organizations: Not on file  ? Attends Theatre manager Meetings: Not on file  ? Marital Status: Married  ?Intimate Partner Violence: Not At Risk  ? Fear of Current or Ex-Partner: No  ? Emotionally Abused: No  ? Physically Abused: No  ? Sexually Abused: No  ? ?Current Meds  ?Medication Sig  ? ALPHA LIPOIC ACID PO Take 250 mg by mouth.  ? amLODipine (NORVASC) 2.5 MG tablet Take 1 tablet (2.5 mg total) by mouth daily. In am if BP>140/?90 take another tablet  ? Ascorbic Acid (VITAMIN C) 1000 MG tablet Take 1,000 mg by mouth 3 (three) times daily.  ? Cholecalciferol (VITAMIN D3) 5000 units CAPS Take by mouth.  ? diazepam (VALIUM) 2 MG tablet 1/2 pill to 1 pill qhs prn  ? gabapentin (NEURONTIN) 100 MG capsule Take 3 capsules (300 mg total) by mouth 3 (three) times daily.  ? HYDROcodone-acetaminophen (NORCO/VICODIN) 5-325 MG tablet Take 1 tablet by mouth 2 (two) times daily as needed (only for flare ups).  ? levothyroxine (SYNTHROID) 50 MCG tablet Take 1 tablet (50 mcg total) by mouth daily before breakfast.  ? Nattokinase 100 MG CAPS Take 100 mg by mouth daily.  ? Strontium Chloride POWD Use as directed 500 mg in the mouth or throat.   ? ?Allergies  ?Allergen Reactions  ? Macrobid [Nitrofurantoin Macrocrystal]   ?  Nausea and sob   ? Sulfa Antibiotics   ?  Swelling  ?  ? ?Recent Results (from the past 2160 hour(s))  ?C Difficile Quick Screen w PCR reflex     Status: None  ? Collection Time: 07/22/21  7:38 AM  ? Specimen: STOOL  ?Result Value Ref Range  ? C Diff antigen NEGATIVE NEGATIVE  ? C Diff toxin NEGATIVE NEGATIVE  ? C Diff interpretation NEGATIVE   ?  Comment: Performed at [facility], [address], [place], [place] [address]  ? ?Objective  ?Body mass index is 32.11 kg/m?. ?Wt Readings from Last 3 Encounters:  ?09/28/21 164 lb 6.4 oz (74.6 kg)  ?09/13/21 166 lb (75.3 kg)  ?07/21/21 166 lb 8 oz (75.5 kg)  ? ?Temp Readings from Last 3 Encounters:  ?09/28/21 97.9 ?F (36.6 ?C) (Oral)  ?07/21/21 98 ?F (36.7 ?C) (Oral)  ?05/25/21 (!) 96.3 ?F (35.7  ?C) (Temporal)  ? ?BP Readings from Last 3 Encounters:  ?09/28/21 130/84  ?07/21/21 134/88  ?  05/25/21 138/80  ? ?Pulse Readings from Last 3 Encounters:  ?09/28/21 77  ?07/21/21 86  ?05/25/21 82  ? ? ?Physical Exam ?Vitals and nursing note reviewed.  ?Constitutional:   ?   Appearance: Normal appearance. She is well-developed and well-groomed.  ?HENT:  ?   Head: Normocephalic and atraumatic.  ?Eyes:  ?   Conjunctiva/sclera: Conjunctivae normal.  ?   Pupils: Pupils are equal, round, and reactive to light.  ?Cardiovascular:  ?   Rate and Rhythm: Normal rate and regular rhythm.  ?   Heart sounds: Normal heart sounds. No murmur heard. ?Pulmonary:  ?   Effort: Pulmonary effort is normal.  ?   Breath sounds: Normal breath sounds.  ?Abdominal:  ?   General: Abdomen is flat. Bowel sounds are normal.  ?   Tenderness: There is abdominal tenderness in the left lower quadrant.  ?Musculoskeletal:     ?   General: No tenderness.  ?Skin: ?   General: Skin is warm and dry.  ?Neurological:  ?   General: No focal deficit present.  ?   Mental Status: She is alert and oriented to person, place, and time. Mental status is at baseline.  ?   Cranial Nerves: Cranial nerves 2-12 are intact.  ?   Motor: Motor function is intact.  ?   Coordination: Coordination is intact.  ?   Gait: Gait is intact.  ?Psychiatric:     ?   Attention and Perception: Attention and perception normal.     ?   Mood and Affect: Mood and affect normal.     ?   Speech: Speech normal.     ?   Behavior: Behavior normal. Behavior is cooperative.     ?   Thought Content: Thought content normal.     ?   Cognition and Memory: Cognition and memory normal.     ?   Judgment: Judgment normal.  ? ? ?Assessment  ?Plan  ?Diarrhea, unspecified type  ?Referred [facility] may need colonoscopy r/o lymphocytic colitis  ? ? ? ?HM ?Declined, prevnar, pna 23 vaccine in the past reassess in future ?Flu vaccine had 02/18/20  ?Had zostervax 2012 ?shingrix 2/2 had 2nd dose which caused flare of lumbar  radiculopathy  ?Pfizer 4/4 ?Declines pna vaccines ?Tdap had 06/29/15 ?Hep c neg 01/10/17 ?A1C 5.5 01/10/17  ?  ?Out of age window pap h/o abnormal pap in 43s but normal since then ?  ?Colonoscopy had 10/13/16 divertic

## 2021-09-28 NOTE — Patient Instructions (Addendum)
Dr.Toledo  ?I am ok with immodium  ? ?Phone Fax E-mail Address  ?[phone] Not available [address]  ? [place] [address]  ?   ? ?Work up for lymphocytic colitis  ? ?Diarrhea, Adult ?Diarrhea is frequent loose and watery bowel movements. Diarrhea can make you feel weak and cause you to become dehydrated. Dehydration can make you tired and thirsty, cause you to have a dry mouth, and decrease how often you urinate. ?Diarrhea typically lasts 2-3 days. However, it can last longer if it is a sign of something more serious. It is important to treat your diarrhea as told by your health care provider. ?Follow these instructions at home: ?Eating and drinking ? ?  ? ?Follow these recommendations as told by your health care provider: ?Take an oral rehydration solution (ORS). This is an over-the-counter medicine that helps return your body to its normal balance of nutrients and water. It is found at pharmacies and retail stores. ?Drink plenty of fluids, such as water, ice chips, diluted fruit juice, and low-calorie sports drinks. You can drink milk also, if desired. ?Avoid drinking fluids that contain a lot of sugar or caffeine, such as energy drinks, sports drinks, and soda. ?Eat bland, easy-to-digest foods in small amounts as you are able. These foods include bananas, applesauce, rice, lean meats, toast, and crackers. ?Avoid alcohol. ?Avoid spicy or fatty foods. ? ?Medicines ?Take over-the-counter and prescription medicines only as told by your health care provider. ?If you were prescribed an antibiotic medicine, take it as told by your health care provider. Do not stop using the antibiotic even if you start to feel better. ?General instructions ? ?Wash your hands often using soap and water. If soap and water are not available, use a hand sanitizer. Others in the household should wash their hands as well. Hands should be washed: ?After using the toilet or changing a diaper. ?Before preparing,  cooking, or serving food. ?While caring for a sick person or while visiting someone in a hospital. ?Drink enough fluid to keep your urine pale yellow. ?Rest at home while you recover. ?Watch your condition for any changes. ?Take a warm bath to relieve any burning or pain from frequent diarrhea episodes. ?Keep all follow-up visits as told by your health care provider. This is important. ?Contact a health care provider if: ?You have a fever. ?Your diarrhea gets worse. ?You have new symptoms. ?You cannot keep fluids down. ?You feel light-headed or dizzy. ?You have a headache. ?You have muscle cramps. ?Get help right away if: ?You have chest pain. ?You feel extremely weak or you faint. ?You have bloody or black stools or stools that look like tar. ?You have severe pain, cramping, or bloating in your abdomen. ?You have trouble breathing or you are breathing very quickly. ?Your heart is beating very quickly. ?Your skin feels cold and clammy. ?You feel confused. ?You have signs of dehydration, such as: ?Dark urine, very little urine, or no urine. ?Cracked lips. ?Dry mouth. ?Sunken eyes. ?Sleepiness. ?Weakness. ?Summary ?Diarrhea is frequent loose and sometimes watery bowel movements. Diarrhea can make you feel weak and cause you to become dehydrated. ?Drink enough fluids to keep your urine pale yellow. ?Make sure that you wash your hands after using the toilet. If soap and water are not available, use hand sanitizer. ?Contact a health care provider if your diarrhea gets worse or you have new symptoms. ?Get help right away if you have signs of dehydration. ?This information is not intended  to replace advice given to you by your health care provider. Make sure you discuss any questions you have with your health care provider. ?Document Revised: 11/23/2020 Document Reviewed: 11/23/2020 ?Elsevier Patient Education ? Gerald. ? ? ?

## 2021-09-29 ENCOUNTER — Encounter: Payer: Self-pay | Admitting: Internal Medicine

## 2021-11-23 ENCOUNTER — Encounter: Payer: Self-pay | Admitting: Internal Medicine

## 2021-11-24 ENCOUNTER — Encounter: Payer: Self-pay | Admitting: Internal Medicine

## 2021-11-24 ENCOUNTER — Other Ambulatory Visit: Payer: Self-pay | Admitting: Internal Medicine

## 2021-11-24 ENCOUNTER — Other Ambulatory Visit (INDEPENDENT_AMBULATORY_CARE_PROVIDER_SITE_OTHER): Payer: Medicare Other

## 2021-11-24 DIAGNOSIS — N3000 Acute cystitis without hematuria: Secondary | ICD-10-CM

## 2021-11-24 DIAGNOSIS — E785 Hyperlipidemia, unspecified: Secondary | ICD-10-CM | POA: Diagnosis not present

## 2021-11-24 DIAGNOSIS — I10 Essential (primary) hypertension: Secondary | ICD-10-CM

## 2021-11-24 DIAGNOSIS — R7303 Prediabetes: Secondary | ICD-10-CM | POA: Diagnosis not present

## 2021-11-24 DIAGNOSIS — E039 Hypothyroidism, unspecified: Secondary | ICD-10-CM | POA: Diagnosis not present

## 2021-11-24 DIAGNOSIS — E559 Vitamin D deficiency, unspecified: Secondary | ICD-10-CM | POA: Diagnosis not present

## 2021-11-24 LAB — CBC WITH DIFFERENTIAL/PLATELET
Basophils Absolute: 0.1 10*3/uL (ref 0.0–0.1)
Basophils Relative: 1.1 % (ref 0.0–3.0)
Eosinophils Absolute: 0.1 10*3/uL (ref 0.0–0.7)
Eosinophils Relative: 1.6 % (ref 0.0–5.0)
HCT: 38.3 % (ref 36.0–46.0)
Hemoglobin: 12.7 g/dL (ref 12.0–15.0)
Lymphocytes Relative: 39.2 % (ref 12.0–46.0)
Lymphs Abs: 2.4 10*3/uL (ref 0.7–4.0)
MCHC: 33.2 g/dL (ref 30.0–36.0)
MCV: 93.5 fl (ref 78.0–100.0)
Monocytes Absolute: 0.5 10*3/uL (ref 0.1–1.0)
Monocytes Relative: 8.5 % (ref 3.0–12.0)
Neutro Abs: 3 10*3/uL (ref 1.4–7.7)
Neutrophils Relative %: 49.6 % (ref 43.0–77.0)
Platelets: 277 10*3/uL (ref 150.0–400.0)
RBC: 4.1 Mil/uL (ref 3.87–5.11)
RDW: 13.2 % (ref 11.5–15.5)
WBC: 6.1 10*3/uL (ref 4.0–10.5)

## 2021-11-24 LAB — LIPID PANEL
Cholesterol: 330 mg/dL — ABNORMAL HIGH (ref 0–200)
HDL: 52.7 mg/dL (ref >39.00)
LDL Cholesterol: 245 mg/dL — ABNORMAL HIGH (ref 0–99)
NonHDL: 276.87
Total CHOL/HDL Ratio: 6
Triglycerides: 157 mg/dL — ABNORMAL HIGH (ref 0.0–149.0)
VLDL: 31.4 mg/dL (ref 0.0–40.0)

## 2021-11-24 LAB — COMPREHENSIVE METABOLIC PANEL
ALT: 22 U/L (ref 0–35)
AST: 17 U/L (ref 0–37)
Albumin: 4.3 g/dL (ref 3.5–5.2)
Alkaline Phosphatase: 76 U/L (ref 39–117)
BUN: 17 mg/dL (ref 6–23)
CO2: 26 mEq/L (ref 19–32)
Calcium: 9.4 mg/dL (ref 8.4–10.5)
Chloride: 105 mEq/L (ref 96–112)
Creatinine, Ser: 0.77 mg/dL (ref 0.40–1.20)
GFR: 77.39 mL/min (ref >60.00)
Glucose, Bld: 100 mg/dL — ABNORMAL HIGH (ref 70–99)
Potassium: 3.9 mEq/L (ref 3.5–5.1)
Sodium: 138 mEq/L (ref 135–145)
Total Bilirubin: 0.6 mg/dL (ref 0.2–1.2)
Total Protein: 7.5 g/dL (ref 6.0–8.3)

## 2021-11-24 LAB — HEMOGLOBIN A1C: Hgb A1c MFr Bld: 5.8 % (ref 4.6–6.5)

## 2021-11-24 LAB — VITAMIN D 25 HYDROXY (VIT D DEFICIENCY, FRACTURES): VITD: 66.34 ng/mL (ref 30.00–100.00)

## 2021-11-24 LAB — TSH: TSH: 2.54 u[IU]/mL (ref 0.35–5.50)

## 2021-11-25 LAB — URINE CULTURE
MICRO NUMBER:: 13594398
Result:: NO GROWTH
SPECIMEN QUALITY:: ADEQUATE

## 2021-12-15 ENCOUNTER — Other Ambulatory Visit: Payer: Self-pay | Admitting: Internal Medicine

## 2021-12-15 DIAGNOSIS — I10 Essential (primary) hypertension: Secondary | ICD-10-CM

## 2021-12-15 DIAGNOSIS — E039 Hypothyroidism, unspecified: Secondary | ICD-10-CM

## 2021-12-15 DIAGNOSIS — G8929 Other chronic pain: Secondary | ICD-10-CM

## 2021-12-15 MED ORDER — LEVOTHYROXINE SODIUM 50 MCG PO TABS: 50.0000 ug | ORAL_TABLET | Freq: Every day | ORAL | 3 refills | Status: DC

## 2021-12-15 MED ORDER — HYDROCODONE-ACETAMINOPHEN 5-325 MG PO TABS: 1.0000 | ORAL_TABLET | Freq: Two times a day (BID) | ORAL | 0 refills | Status: DC | PRN

## 2021-12-15 MED ORDER — AMLODIPINE BESYLATE 2.5 MG PO TABS: 2.5000 mg | ORAL_TABLET | Freq: Every day | ORAL | 3 refills | Status: DC

## 2021-12-29 ENCOUNTER — Encounter: Payer: Self-pay | Admitting: *Deleted

## 2022-01-01 ENCOUNTER — Ambulatory Visit: Payer: Medicare Other | Admitting: General Practice

## 2022-01-01 ENCOUNTER — Encounter: Payer: Self-pay | Admitting: Anesthesiology

## 2022-01-01 ENCOUNTER — Ambulatory Visit
Admission: RE | Admit: 2022-01-01 | Discharge: 2022-01-01 | Disposition: A | Discharge status: Home / Self Care | Payer: Medicare Other | Attending: Gastroenterology | Admitting: Gastroenterology

## 2022-01-01 ENCOUNTER — Encounter
Admission: RE | Disposition: A | Discharge status: Home / Self Care | Payer: Self-pay | Source: Home / Self Care | Attending: Gastroenterology

## 2022-01-01 ENCOUNTER — Other Ambulatory Visit: Payer: Self-pay

## 2022-01-01 DIAGNOSIS — E039 Hypothyroidism, unspecified: Secondary | ICD-10-CM | POA: Diagnosis not present

## 2022-01-01 DIAGNOSIS — Z85828 Personal history of other malignant neoplasm of skin: Secondary | ICD-10-CM | POA: Insufficient documentation

## 2022-01-01 DIAGNOSIS — K5730 Diverticulosis of large intestine without perforation or abscess without bleeding: Secondary | ICD-10-CM | POA: Diagnosis not present

## 2022-01-01 DIAGNOSIS — Z86718 Personal history of other venous thrombosis and embolism: Secondary | ICD-10-CM | POA: Insufficient documentation

## 2022-01-01 DIAGNOSIS — D6851 Activated protein C resistance: Secondary | ICD-10-CM | POA: Insufficient documentation

## 2022-01-01 DIAGNOSIS — K529 Noninfective gastroenteritis and colitis, unspecified: Secondary | ICD-10-CM | POA: Diagnosis present

## 2022-01-01 DIAGNOSIS — G4733 Obstructive sleep apnea (adult) (pediatric): Secondary | ICD-10-CM | POA: Insufficient documentation

## 2022-01-01 DIAGNOSIS — K52831 Collagenous colitis: Secondary | ICD-10-CM | POA: Diagnosis not present

## 2022-01-01 DIAGNOSIS — I10 Essential (primary) hypertension: Secondary | ICD-10-CM | POA: Diagnosis not present

## 2022-01-01 DIAGNOSIS — K760 Fatty (change of) liver, not elsewhere classified: Secondary | ICD-10-CM | POA: Diagnosis not present

## 2022-01-01 HISTORY — PX: COLONOSCOPY WITH PROPOFOL: SHX5780

## 2022-01-01 SURGERY — COLONOSCOPY WITH PROPOFOL
Anesthesia: General

## 2022-01-01 MED ORDER — PROPOFOL 500 MG/50ML IV EMUL
INTRAVENOUS | Status: DC | PRN
  Administered 2022-01-01: 120 ug/kg/min via INTRAVENOUS

## 2022-01-01 MED ORDER — PROPOFOL 1000 MG/100ML IV EMUL
INTRAVENOUS | Status: AC
  Filled 2022-01-01: qty 100

## 2022-01-01 MED ORDER — SODIUM CHLORIDE 0.9 % IV SOLN
INTRAVENOUS | Status: DC
Start: 2022-01-01 — End: 2022-01-01

## 2022-01-01 NOTE — Anesthesia Procedure Notes (Signed)
Date/Time: 01/01/2022 12:21 PM  Performed by: Donalda Ewings, CindyPre-anesthesia Checklist: Patient identified, Emergency Drugs available, Suction available, Patient being monitored and Timeout performed Patient Re-evaluated:Patient Re-evaluated prior to induction Oxygen Delivery Method: Nasal cannula Preoxygenation: Pre-oxygenation with 100% oxygen Induction Type: IV induction Placement Confirmation: positive ETCO2 and CO2 detector

## 2022-01-01 NOTE — Op Note (Signed)
[facility]astroenter[facility] Patient Name: Tonya Green Procedure Date: 01/01/2022 11:29 AM MRN: [id number] Account #: [id number] Date of Birth: Feb 12, 1951 Admit Type: Outpatient Age: 71 Room: [facility] ENDO ROOM 3 Gender: Female Note Status: Finalized Instrument Name: Jasper Riling [id number] Procedure:             Colonoscopy Indications:           Chronic diarrhea Providers:             Andrey Farmer MD, MD Referring MD:          Andrey Farmer MD, MD (Referring MD), Nino Glow                         Mclean-Scocuzza MD, MD (Referring MD) Medicines:             Monitored Anesthesia Care Complications:         No immediate complications. Estimated blood loss:                         Minimal. Procedure:             Pre-Anesthesia Assessment:                        - Prior to the procedure, a History and Physical was                         performed, and patient medications and allergies were                         reviewed. The patient is competent. The risks and                         benefits of the procedure and the sedation options and                         risks were discussed with the patient. All questions                         were answered and informed consent was obtained.                         Patient identification and proposed procedure were                         verified by the physician, the nurse, the                         anesthesiologist, the anesthetist and the technician                         in the endoscopy suite. Mental Status Examination:                         alert and oriented. Airway Examination: normal                         oropharyngeal airway and neck mobility. Respiratory  Examination: clear to auscultation. CV Examination:                         normal. Prophylactic Antibiotics: The patient does not                         require prophylactic antibiotics. Prior                          Anticoagulants: The patient has taken no previous                         anticoagulant or antiplatelet agents. ASA Grade                         Assessment: II - A patient with mild systemic disease.                         After reviewing the risks and benefits, the patient                         was deemed in satisfactory condition to undergo the                         procedure. The anesthesia plan was to use monitored                         anesthesia care (MAC). Immediately prior to                         administration of medications, the patient was                         re-assessed for adequacy to receive sedatives. The                         heart rate, respiratory rate, oxygen saturations,                         blood pressure, adequacy of pulmonary ventilation, and                         response to care were monitored throughout the                         procedure. The physical status of the patient was                         re-assessed after the procedure.                        After obtaining informed consent, the colonoscope was                         passed under direct vision. Throughout the procedure,                         the patient's blood pressure, pulse, and oxygen  saturations were monitored continuously. The                         Colonoscope was introduced through the anus and                         advanced to the the terminal ileum. The colonoscopy                         was performed without difficulty. The patient                         tolerated the procedure well. The quality of the bowel                         preparation was good. Findings:      The perianal and digital rectal examinations were normal.      The terminal ileum appeared normal.      Normal mucosa was found in the entire colon. Biopsies for histology were       taken with a cold forceps from the entire colon for evaluation of       microscopic  colitis. Estimated blood loss was minimal.      Multiple small-mouthed diverticula were found in the sigmoid colon.      The exam was otherwise without abnormality on direct and retroflexion       views. Impression:            - The examined portion of the ileum was normal.                        - Normal mucosa in the entire examined colon. Biopsied.                        - Diverticulosis in the sigmoid colon.                        - The examination was otherwise normal on direct and                         retroflexion views. Recommendation:        - Discharge patient to home.                        - Resume previous diet.                        - Continue present medications.                        - Await pathology results.                        - Repeat colonoscopy is not recommended due to current                         age (67 years or older) for screening purposes.                        - Return to referring physician as previously  scheduled. Procedure Code(s):     --- Professional ---                        [phone], Colonoscopy, flexible; with biopsy, single or                         multiple Diagnosis Code(s):     --- Professional ---                        K52.9, Noninfective gastroenteritis and colitis,                         unspecified                        K57.30, Diverticulosis of large intestine without                         perforation or abscess without bleeding CPT copyright 2019 American Medical Association. All rights reserved. The codes documented in this report are preliminary and upon coder review may  be revised to meet current compliance requirements. Andrey Farmer MD, MD 01/01/2022 12:45:05 PM Number of Addenda: 0 Note Initiated On: 01/01/2022 11:29 AM Scope Withdrawal Time: 0 hours 9 minutes 1 second  Total Procedure Duration: 0 hours 16 minutes 45 seconds  Estimated Blood Loss:  Estimated blood loss was minimal.       [facility]

## 2022-01-01 NOTE — Anesthesia Preprocedure Evaluation (Signed)
Anesthesia Evaluation  Patient identified by MRN, date of birth, ID band Patient awake    Reviewed: Allergy & Precautions, NPO status , Patient's Chart, lab work & pertinent test results  History of Anesthesia Complications Negative for: history of anesthetic complications  Airway Mallampati: III  TM Distance: >3 FB Neck ROM: full    Dental  (+) Chipped   Pulmonary neg pulmonary ROS, neg shortness of breath, neg COPD,    Pulmonary exam normal        Cardiovascular hypertension, negative cardio ROS Normal cardiovascular exam     Neuro/Psych negative neurological ROS  negative psych ROS   GI/Hepatic negative GI ROS, Neg liver ROS,   Endo/Other  Hypothyroidism   Renal/GU negative Renal ROS  negative genitourinary   Musculoskeletal   Abdominal   Peds  Hematology negative hematology ROS (+)   Anesthesia Other Findings Past Medical History: No date: Basal cell carcinoma     Comment:  left nasal sidewall [facility] No date: Carotid artery stenosis     Comment:  mild L>R 03/26/16 [place]  No date: Diverticulosis     Comment:  colonoscopy 10/13/16 [place] No date: Factor 5 Leiden mutation, heterozygous ([place]) No date: Fatty liver No date: History of blood transfusion     Comment:  birth Rh factor  No date: Hyperlipidemia No date: Hypertension No date: OSA on CPAP No date: Radiculopathy No date: Right leg DVT ([place])     Comment:  2010 s/p back surgery was on Coumadin x 6 months  No date: Thyroid disease     Comment:  hypothyroidism   Past Surgical History: No date: APPENDECTOMY     Comment:  2000 No date: BACK SURGERY 2005: BREAST EXCISIONAL BIOPSY; Left     Comment:  for infection No date: BREAST SURGERY     Comment:  biospy ? year  No date: COLONOSCOPY 05/11/2019: MOHS SURGERY     Comment:  left nasal side wall bcc Dr. Winifred Olive in [place]  No date: ruptured disc  repair     Comment:  L4/5 10/1605 with complications residual right leg               weakness/reduced sensation and DVT in 2010 post surgery   BMI    Body Mass Index: 22.80 kg/m      Reproductive/Obstetrics negative OB ROS                            Anesthesia Physical Anesthesia Plan  ASA: 2  Anesthesia Plan: General   Post-op Pain Management: Minimal or no pain anticipated   Induction: Intravenous  PONV Risk Score and Plan: 3 and Propofol infusion, TIVA and Ondansetron  Airway Management Planned: Nasal Cannula  Additional Equipment: None  Intra-op Plan:   Post-operative Plan:   Informed Consent: I have reviewed the patients History and Physical, chart, labs and discussed the procedure including the risks, benefits and alternatives for the proposed anesthesia with the patient or authorized representative who has indicated his/her understanding and acceptance.     Dental advisory given  Plan Discussed with: CRNA and Surgeon  Anesthesia Plan Comments: (Discussed risks of anesthesia with patient, including possibility of difficulty with spontaneous ventilation under anesthesia necessitating airway intervention, PONV, and rare risks such as cardiac or respiratory or neurological events, and allergic reactions. Discussed the role of CRNA in  patient's perioperative care. Patient understands.)        Anesthesia Quick Evaluation

## 2022-01-01 NOTE — Transfer of Care (Signed)
Immediate Anesthesia Transfer of Care Note  Patient: Tonya Green  Procedure(s) Performed: COLONOSCOPY WITH PROPOFOL  Patient Location: PACU  Anesthesia Type:General  Level of Consciousness: awake and sedated  Airway & Oxygen Therapy: Patient Spontanous Breathing and Patient connected to nasal cannula oxygen  Post-op Assessment: Report given to RN and Post -op Vital signs reviewed and stable  Post vital signs: Reviewed and stable  Last Vitals:  Vitals Value Taken Time  BP 125/70 01/01/22 1246  Temp 37 C 01/01/22 1246  Pulse 66 01/01/22 1248  Resp 17 01/01/22 1248  SpO2 96 % 01/01/22 1248  Vitals shown include unvalidated device data.  Last Pain:  Vitals:   01/01/22 1246  TempSrc: Temporal  PainSc: Asleep         Complications: No notable events documented.

## 2022-01-01 NOTE — Interval H&P Note (Signed)
History and Physical Interval Note:  01/01/2022 12:15 PM  Tonya Green  has presented today for surgery, with the diagnosis of Diarrhea.  The various methods of treatment have been discussed with the patient and family. After consideration of risks, benefits and other options for treatment, the patient has consented to  Procedure(s): COLONOSCOPY WITH PROPOFOL (N/A) as a surgical intervention.  The patient's history has been reviewed, patient examined, no change in status, stable for surgery.  I have reviewed the patient's chart and labs.  Questions were answered to the patient's satisfaction.     Lesly Rubenstein  Ok to proceed with colonoscopy

## 2022-01-01 NOTE — H&P (Signed)
Outpatient short stay form Pre-procedure 01/01/2022  Lesly Rubenstein, MD  Primary Physician: McLean-Scocuzza, Nino Glow, MD  Reason for visit:  Chronic diarrhea  History of present illness:    71 y/o lady with history of hypertension and hypothyroidism here for colonoscopy for loose stools. We had diagnosed her with an e. Coli stool infection and it seems her loose stools improved somewhat. No blood thinners. History of appendectomy.     Current Facility-Administered Medications:    0.9 %  sodium chloride infusion, , Intravenous, Continuous, Alisson Rozell, Hilton Cork, MD, Last Rate: 20 mL/hr at 01/01/22 1208, New Bag at 01/01/22 1208  Medications Prior to Admission  Medication Sig Dispense Refill Last Dose   ALPHA LIPOIC ACID PO Take 250 mg by mouth.   Past Week   amLODipine (NORVASC) 2.5 MG tablet Take 1 tablet (2.5 mg total) by mouth daily. In am if BP>140/?90 take another tablet 180 tablet 3 Past Week   Ascorbic Acid (VITAMIN C) 1000 MG tablet Take 1,000 mg by mouth 3 (three) times daily.   Past Week   Cholecalciferol (VITAMIN D3) 5000 units CAPS Take by mouth.   Past Week   diazepam (VALIUM) 2 MG tablet 1/2 pill to 1 pill qhs prn 30 tablet 5 Past Week   gabapentin (NEURONTIN) 100 MG capsule Take 3 capsules (300 mg total) by mouth 3 (three) times daily. 560 capsule 11 Past Week   HYDROcodone-acetaminophen (NORCO/VICODIN) 5-325 MG tablet Take 1 tablet by mouth 2 (two) times daily as needed (only for flare ups). 30 tablet 0 Past Week   levothyroxine (SYNTHROID) 50 MCG tablet Take 1 tablet (50 mcg total) by mouth daily before breakfast. 90 tablet 3 01/01/2022 at 0200   MAGNESIUM MALATE PO Take 625 mg by mouth.   Past Week   Nattokinase 100 MG CAPS Take 100 mg by mouth daily.   Past Week   Strontium Chloride POWD Use as directed 500 mg in the mouth or throat.    Past Week     Allergies  Allergen Reactions   Macrobid [Nitrofurantoin Macrocrystal]     Nausea and sob    Sulfa Antibiotics      Swelling       Past Medical History:  Diagnosis Date   Basal cell carcinoma    left nasal sidewall [facility]   Carotid artery stenosis    mild L>R 03/26/16 [place]    Diverticulosis    colonoscopy 10/13/16 [place]   Factor 5 Leiden mutation, heterozygous ([place])    Fatty liver    History of blood transfusion    birth Rh factor    Hyperlipidemia    Hypertension    OSA on CPAP    Radiculopathy    Right leg DVT ([place])    2010 s/p back surgery was on Coumadin x 6 months    Thyroid disease    hypothyroidism     Review of systems:  Otherwise negative.    Physical Exam  Gen: Alert, oriented. Appears stated age.  HEENT: PERRLA. Lungs: No respiratory distress CV: RRR Abd: soft, benign, no masses Ext: No edema    Planned procedures: Proceed with colonoscopy. The patient understands the nature of the planned procedure, indications, risks, alternatives and potential complications including but not limited to bleeding, infection, perforation, damage to internal organs and possible oversedation/side effects from anesthesia. The patient agrees and gives consent to proceed.  Please refer to procedure notes for findings, recommendations and patient disposition/instructions.  Lesly Rubenstein, MD [facility]

## 2022-01-02 ENCOUNTER — Encounter: Payer: Self-pay | Admitting: Gastroenterology

## 2022-01-02 NOTE — Anesthesia Postprocedure Evaluation (Signed)
Anesthesia Post Note  Patient: Tonya Green  Procedure(s) Performed: COLONOSCOPY WITH PROPOFOL  Anesthesia Type: General Anesthetic complications: no   No notable events documented.   Last Vitals:  Vitals:   01/01/22 1256 01/01/22 1306  BP: (!) 107/56 127/71  Pulse:  (!) 58  Resp:    Temp:    SpO2:  99%    Last Pain:  Vitals:   01/01/22 1306  TempSrc:   PainSc: 0-No pain                 Dimas Millin

## 2022-01-03 LAB — SURGICAL PATHOLOGY

## 2022-01-03 LAB — HM COLONOSCOPY

## 2022-01-07 ENCOUNTER — Other Ambulatory Visit: Payer: Self-pay | Admitting: Internal Medicine

## 2022-01-07 ENCOUNTER — Encounter: Payer: Self-pay | Admitting: Internal Medicine

## 2022-01-07 DIAGNOSIS — M62838 Other muscle spasm: Secondary | ICD-10-CM

## 2022-02-04 ENCOUNTER — Encounter: Payer: Self-pay | Admitting: Internal Medicine

## 2022-02-16 ENCOUNTER — Other Ambulatory Visit: Payer: Self-pay | Admitting: Family

## 2022-02-16 DIAGNOSIS — M62838 Other muscle spasm: Secondary | ICD-10-CM

## 2022-03-23 ENCOUNTER — Ambulatory Visit: Payer: Medicare Other | Admitting: Internal Medicine

## 2022-03-30 ENCOUNTER — Encounter: Payer: Self-pay | Admitting: Family Medicine

## 2022-03-30 NOTE — Telephone Encounter (Signed)
Updated both Patient's medical records

## 2022-04-03 ENCOUNTER — Encounter: Payer: Self-pay | Admitting: Adult Health

## 2022-04-03 ENCOUNTER — Ambulatory Visit (INDEPENDENT_AMBULATORY_CARE_PROVIDER_SITE_OTHER): Payer: Medicare Other | Admitting: Adult Health

## 2022-04-03 ENCOUNTER — Ambulatory Visit
Admission: RE | Admit: 2022-04-03 | Discharge: 2022-04-03 | Disposition: A | Discharge status: Home / Self Care | Payer: Medicare Other | Source: Ambulatory Visit | Attending: Adult Health | Admitting: Adult Health

## 2022-04-03 VITALS — BP 120/72 | HR 84 | Temp 98.2°F | Ht 65.0 in | Wt 177.4 lb

## 2022-04-03 DIAGNOSIS — R053 Chronic cough: Secondary | ICD-10-CM

## 2022-04-03 DIAGNOSIS — G4733 Obstructive sleep apnea (adult) (pediatric): Secondary | ICD-10-CM

## 2022-04-03 HISTORY — DX: Chronic cough: R05.3

## 2022-04-03 NOTE — Assessment & Plan Note (Signed)
Chronic rhinitis and chronic cough.  Suspect secondary to postnasal drip.  Patient is to begin saline nasal spray twice daily.  Saline nasal gel at bedtime.  Trial of Zyrtec 10 mg at bedtime.   Check chest x-ray today.  If not improved on return visit can consider spirometry and more aggressive chronic rhinitis/chronic cough regimen  Plan  Patient Instructions  Very nice to meet you today, keep up the great work Continue on CPAP at bedtime Work on healthy weight Do not drive if sleepy  Begin Zyrtec '10mg'$  At bedtime   Saline nasal Twice daily As needed   Saline nasal gel At bedtime  As needed   Delsym 2 tsp Twice daily  As needed  cough  Chlorpheniramine '4mg'$  At bedtime  As needed  severe nasal drip.  Chest xray today .   Follow up in 6 weeks and As needed   Please contact office for sooner follow up if symptoms do not improve or worsen or seek emergency care

## 2022-04-03 NOTE — Assessment & Plan Note (Addendum)
Excellent control compliance on nocturnal CPAP.  Patient education was given.  Continue on CPAP at bedtime - discussed how weight can impact sleep and risk for sleep disordered breathing - discussed options to assist with weight loss: combination of diet modification, cardiovascular and strength training exercises   - had an extensive discussion regarding the adverse health consequences related to untreated sleep disordered breathing - specifically discussed the risks for hypertension, coronary artery disease, cardiac dysrhythmias, cerebrovascular disease, and diabetes - lifestyle modification discussed   - discussed how sleep disruption can increase risk of accidents, particularly when driving - safe driving practices were discussed     Plan  Patient Instructions  Very nice to meet you today, keep up the great work Continue on CPAP at bedtime Work on healthy weight Do not drive if sleepy Follow-up in 1 year with Dr. Halford Chessman  or Parrrett NP and As needed

## 2022-04-03 NOTE — Progress Notes (Signed)
$'[name]$  ID: Tonya Green, female    DOB: 1950/12/19, 71 y.o.   MRN: [id number]  Chief Complaint  Patient presents with   sleep consult    Referring provider: McLean-Scocuzza, Olivia Mackie *  HPI: 71 year old female seen for sleep consult to establish for OSA  Former patient of Dr. Ashby Dawes   TEST/EVENTS :   split-night in 2014, at that time she had mild obstructive sleep apnea with an AHI of 11.9, optimal control on 6 cm H2O.  04/03/2022 Sleep Consult  Patient presents today for a sleep consult.  She is here to reestablish for sleep apnea.  She was a former patient of Dr. Felicie Morn and on CPAP therapy.  She was last seen April 2020.  Patient says she was diagnosed with sleep apnea in 2014.  Previous sleep study showed mild obstructive sleep apnea the AHI 11.9/hour with optimal CPAP control on 6 cm H2O.  Patient says she wears her CPAP every single night.  She needs an order for new supplies.  She uses [facility].  Typically goes to bed about 9 PM.  Takes only about 10 minutes ago sleep.  Wakes up in the [place] usually about 3 AM.  And then gets up about 4:30 AM.  Weight is up about 10 pounds over the last 2 years.  Current weight is at 177 pounds with a BMI at 29.  Caffeine intake.  No symptoms suspicious for Lexer sleep paralysis.  No history of congestive heart failure or stroke.  Epworth score is 6 out of 24.  Typically gets sleepy if she sits down to watch TV and after noon hours. CPAP download shows excellent compliance with 100% usage.  Daily average usage at 7 hours.  Patient is on auto CPAP 5 to 20 cm H2O.  AHI is 0.5.  Daily average pressure at 9.7 cm H2O. Uses dream wear nasal mask. Needs supplies to [facility]  Epworth 6 out 24 , gets sleepy watching TV and evening hours.   Complains over the last 3 to 4 years since moving from [place] to the local area that she has had this intermittent episodes of watery eyes postnasal drip nasal drainage and intermittent dry cough.  She denies any  fever, discolored mucus, chest pain, dyspnea.  She has tried multiple over-the-counter products including Dimetapp, Benadryl, Allegra without any significant treatment.  Has not taken any of these products on a regular basis.  She is a never smoker.  She is retired Marine scientist.  She has no pets.  No basement no hot tub no unusual hobbies no birds or chickens.  No recent travel.  No history of known lung disease.     Allergies  Allergen Reactions   Macrobid [Nitrofurantoin Macrocrystal]     Nausea and sob    Sulfa Antibiotics     Swelling      Immunization History  Administered Date(s) Administered   Fluad Quad(high Dose 65+) 03/29/2022   Influenza, High Dose Seasonal PF 03/18/2018, 02/01/2019, 02/18/2020   Influenza-Unspecified 03/08/2021   Moderna Covid-19 Vaccine Bivalent Booster 31yr & up 02/07/2021, 11/21/2021   PFIZER Comirnaty(Gray Top)Covid-19 Tri-Sucrose Vaccine 07/10/2020   PFIZER(Purple Top)SARS-COV-2 Vaccination 06/03/2019, 07/01/2019, 01/17/2020   RSV IGIV 01/04/2022   Tdap 06/29/2015   Unspecified SARS-COV-2 Vaccination 03/07/2022   Zoster Recombinat (Shingrix) 06/30/2018, 10/06/2018, 11/06/2018    Past Medical History:  Diagnosis Date   Basal cell carcinoma    left nasal sidewall [facility]  Carotid artery stenosis  mild L>R 03/26/16 [place]    Diverticulosis    colonoscopy 10/13/16 [place]   Factor 5 Leiden mutation, heterozygous ([place])    Fatty liver    History of blood transfusion    birth Rh factor    Hyperlipidemia    Hypertension    OSA on CPAP    Radiculopathy    Right leg DVT ([place])    2010 s/p back surgery was on Coumadin x 6 months    Thyroid disease    hypothyroidism     Tobacco History: Social History   Tobacco Use  Smoking Status Never  Smokeless Tobacco Never   Counseling given: Not Answered   Outpatient Medications Prior to Visit  Medication Sig Dispense Refill   ALPHA LIPOIC ACID PO Take 250 mg by  mouth.     amLODipine (NORVASC) 2.5 MG tablet Take 1 tablet (2.5 mg total) by mouth daily. In am if BP>140/?90 take another tablet 180 tablet 3   Ascorbic Acid (VITAMIN C) 1000 MG tablet Take 1,000 mg by mouth 3 (three) times daily.     Cholecalciferol (VITAMIN D3) 5000 units CAPS Take by mouth.     diazepam (VALIUM) 2 MG tablet TAKE 1/2 TO 1 TABLET BY MOUTH EVERY NIGHT AT BEDTIME AS NEEDED 30 tablet 0   gabapentin (NEURONTIN) 100 MG capsule Take 3 capsules (300 mg total) by mouth 3 (three) times daily. 560 capsule 11   HYDROcodone-acetaminophen (NORCO/VICODIN) 5-325 MG tablet Take 1 tablet by mouth 2 (two) times daily as needed (only for flare ups). 30 tablet 0   levothyroxine (SYNTHROID) 50 MCG tablet Take 1 tablet (50 mcg total) by mouth daily before breakfast. 90 tablet 3   MAGNESIUM MALATE PO Take 625 mg by mouth.     Nattokinase 100 MG CAPS Take 100 mg by mouth daily.     Strontium Chloride POWD Use as directed 500 mg in the mouth or throat.      No facility-administered medications prior to visit.     Review of Systems:   Constitutional:   No  weight loss, night sweats,  Fevers, chills, fatigue, or  lassitude.  HEENT:   No headaches,  Difficulty swallowing,  Tooth/dental problems, or  Sore throat,                No sneezing, itching, ear Green,  +nasal congestion, post nasal drip,   CV:  No chest pain,  Orthopnea, PND, swelling in lower extremities, anasarca, dizziness, palpitations, syncope.   GI  No heartburn, indigestion, abdominal pain, nausea, vomiting, diarrhea, change in bowel habits, loss of appetite, bloody stools.   Resp: .  No chest wall deformity  Skin: no rash or lesions.  GU: no dysuria, change in color of urine, no urgency or frequency.  No flank pain, no hematuria   MS:  No joint pain or swelling.  No decreased range of motion.  No back pain.    Physical Exam  BP 120/72 (BP Location: Left Arm, Cuff Size: Normal)   Pulse 84   Temp 98.2 F (36.8 C)  (Temporal)   Ht '5\' 5"'$  (1.651 m)   Wt 177 lb 6.4 oz (80.5 kg)   SpO2 94%   BMI 29.52 kg/m   GEN: A/Ox3; pleasant , NAD, well nourished    HEENT:  [place]/AT,  EACs-clear, TMs-wnl, NOSE-clear, THROAT-clear, no lesions, no postnasal drip or exudate noted.   NECK:  Supple w/ fair ROM; no JVD; normal carotid impulses w/o bruits; no thyromegaly or nodules  palpated; no lymphadenopathy.    RESP  Clear  P & A; w/o, wheezes/ rales/ or rhonchi. no accessory muscle use, no dullness to percussion  CARD:  RRR, no m/r/g, no peripheral edema, pulses intact, no cyanosis or clubbing.  GI:   Soft & nt; nml bowel sounds; no organomegaly or masses detected.   Musco: Warm bil, no deformities or joint swelling noted.   Neuro: alert, no focal deficits noted.    Skin: Warm, no lesions or rashes    Lab Results:  CBC    Component Value Date/Time   WBC 6.1 11/24/2021 0731   RBC 4.10 11/24/2021 0731   HGB 12.7 11/24/2021 0731   HCT 38.3 11/24/2021 0731   PLT 277.0 11/24/2021 0731   MCV 93.5 11/24/2021 0731   MCH 31.1 08/05/2020 0535   MCHC 33.2 11/24/2021 0731   RDW 13.2 11/24/2021 0731   LYMPHSABS 2.4 11/24/2021 0731   MONOABS 0.5 11/24/2021 0731   EOSABS 0.1 11/24/2021 0731   BASOSABS 0.1 11/24/2021 0731    BMET    Component Value Date/Time   NA 138 11/24/2021 0731   K 3.9 11/24/2021 0731   CL 105 11/24/2021 0731   CO2 26 11/24/2021 0731   GLUCOSE 100 (H) 11/24/2021 0731   BUN 17 11/24/2021 0731   CREATININE 0.77 11/24/2021 0731   CALCIUM 9.4 11/24/2021 0731   GFRNONAA >60 08/05/2020 0535   GFRAA >60 12/10/2017 1133    BNP No results found for: "BNP"  ProBNP No results found for: "PROBNP"  Imaging: No results found.        No data to display          No results found for: "NITRICOXIDE"      Assessment & Plan:   OSA on CPAP Excellent control compliance on nocturnal CPAP.  Patient education was given.  Continue on CPAP at bedtime - discussed how weight can  impact sleep and risk for sleep disordered breathing - discussed options to assist with weight loss: combination of diet modification, cardiovascular and strength training exercises   - had an extensive discussion regarding the adverse health consequences related to untreated sleep disordered breathing - specifically discussed the risks for hypertension, coronary artery disease, cardiac dysrhythmias, cerebrovascular disease, and diabetes - lifestyle modification discussed   - discussed how sleep disruption can increase risk of accidents, particularly when driving - safe driving practices were discussed     Plan  Patient Instructions  Very nice to meet you today, keep up the great work Continue on CPAP at bedtime Work on healthy weight Do not drive if sleepy Follow-up in 1 year with Dr. Halford Chessman  or Parrrett NP and As needed      Chronic cough Chronic rhinitis and chronic cough.  Suspect secondary to postnasal drip.  Patient is to begin saline nasal spray twice daily.  Saline nasal gel at bedtime.  Trial of Zyrtec 10 mg at bedtime.   Check chest x-ray today.  If not improved on return visit can consider spirometry and more aggressive chronic rhinitis/chronic cough regimen  Plan  Patient Instructions  Very nice to meet you today, keep up the great work Continue on CPAP at bedtime Work on healthy weight Do not drive if sleepy  Begin Zyrtec '10mg'$  At bedtime   Saline nasal Twice daily As needed   Saline nasal gel At bedtime  As needed   Delsym 2 tsp Twice daily  As needed  cough  Chlorpheniramine '4mg'$  At bedtime  As needed  severe nasal drip.  Chest xray today .   Follow up in 6 weeks and As needed   Please contact office for sooner follow up if symptoms do not improve or worsen or seek emergency care         Rexene Edison, NP 04/03/2022

## 2022-04-03 NOTE — Patient Instructions (Addendum)
Very nice to meet you today, keep up the great work Continue on CPAP at bedtime Work on healthy weight Do not drive if sleepy  Begin Zyrtec '10mg'$  At bedtime   Saline nasal Twice daily As needed   Saline nasal gel At bedtime  As needed   Delsym 2 tsp Twice daily  As needed  cough  Chlorpheniramine '4mg'$  At bedtime  As needed  severe nasal drip.  Chest xray today .   Follow up in 6 weeks and As needed   Please contact office for sooner follow up if symptoms do not improve or worsen or seek emergency care

## 2022-04-04 NOTE — Progress Notes (Signed)
Reviewed and agree with assessment/plan.   Chesley Mires, MD [facility] 04/04/2022, 8:14 AM Pager:  [phone]

## 2022-04-18 ENCOUNTER — Telehealth: Payer: Self-pay | Admitting: Adult Health

## 2022-04-18 NOTE — Telephone Encounter (Signed)
Called [facility] this morning and spoke to Leonard and she states that they do have the order and will get it processed.   Called patient and spoke to her and let her know that I called [facility] and they they do have the order for cpap supplies.. Nothing further needed

## 2022-04-29 NOTE — Patient Instructions (Incomplete)
It was a pleasure meeting you today. Thank you for allowing me to take part in your health care.  Our goals for today as we discussed include:  Will refill your Valium  If you have any questions or concerns, please do not hesitate to call the office at [phone].  I look forward to our next visit and until then take care and stay safe.  Regards,   Dana Allan, MD   [facility]

## 2022-04-29 NOTE — Progress Notes (Signed)
SUBJECTIVE:   Chief Complaint  Patient presents with   Transitions Of Care   HPI Patient presents to clinic to transfer care  Hypertension Asymptomatic. Takes amlodipine 2.5 mg daily and tolerating well.  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  Has previously seen pain management.  Takes Valium 1-2 mg for flareups which occur infrequently.  Also takes Norco 5-325 mg 1 tablet twice daily as needed for flareups only.  Patient reports flareups occur infrequently through the year but when they do happen they have been so severe that only Valium and Norco have worked for her in the past.  She is also using frequency specific microcurrent that was prescribed previously by thought physiatrist in [place].  She currently uses gabapentin 300 mg daily to help with pain.  PERTINENT PMH / PSH: Hypertension Hyperlipidemia OSA on CPAP Muscle spasms Lumbar radiculopathy Chronic low back pain Factor V Leiden mutation Chronic intermittent opioid use Chronic intermittent benzo use   OBJECTIVE:  BP 130/70   Pulse 69   Temp 98.2 F (36.8 C) (Oral)   Ht '5\' 5"'$  (1.651 m)   Wt 172 lb 6.4 oz (78.2 kg)   SpO2 97%   BMI 28.69 kg/m    Physical Exam Vitals reviewed.  Constitutional:      General: She is not in acute distress.    Appearance: She is not ill-appearing.  HENT:     Head: Normocephalic.     Nose: Nose normal.  Eyes:     Conjunctiva/sclera: Conjunctivae normal.  Cardiovascular:     Rate and Rhythm: Normal rate and regular rhythm.     Heart sounds: Normal heart sounds.  Pulmonary:     Effort: Pulmonary effort is normal.     Breath sounds: Normal breath sounds.  Abdominal:     General: Abdomen is flat. Bowel sounds are normal.     Palpations: Abdomen is soft.  Musculoskeletal:        General: Normal range of motion.     Cervical back: Normal range of motion.  Skin:    General: Skin is warm  and dry.  Neurological:     Mental Status: She is alert and oriented to person, place, and time. Mental status is at baseline.  Psychiatric:        Mood and Affect: Mood normal.        Behavior: Behavior normal.        Thought Content: Thought content normal.        Judgment: Judgment normal.     ASSESSMENT/PLAN:  Muscle spasm Assessment & Plan: Continue Valium 1 to 2 mg at night for severe spasm Refill x 30 tablets PDMP reviewed and appropriate  Orders: -     diazePAM; TAKE 1/2 TO 1 TABLET BY MOUTH EVERY NIGHT AT BEDTIME AS NEEDED  Dispense: 30 tablet; Refill: 0  Hyperlipidemia, unspecified hyperlipidemia type -     Lipid panel; Future  Prediabetes -     Hemoglobin A1c; Future -     CBC with Differential/Platelet; Future -     Vitamin B12; Future  Essential hypertension Assessment & Plan: Well-controlled per JNC 8 guidelines goal less than 140/90 for age Continue amlodipine 5 mg daily Follow-up at next visit  Orders: -     Comprehensive metabolic panel; Future  Vitamin D deficiency -     VITAMIN D 25 Hydroxy (Vit-D Deficiency, Fractures); Future  Hypothyroidism, unspecified type Assessment & Plan: Continue  Synthroid 50 mcg daily Repeat TSH annually    Chronic low back pain with sciatica, sciatica laterality unspecified, unspecified back pain laterality Assessment & Plan: PDMP reviewed and appropriate Refill diazepam 2 mg x 30 tablets Continue Norco 5-325 mg 1 tablet twice daily x 30 tablets.  Last filled 11/2021 Continue gabapentin 300 mg at night Plan for UDS at next visit   Neuropathy Assessment & Plan: Continue gabapentin 300 mg nightly   Other chronic pain Assessment & Plan: Chronic opioids Need pain management contract signed at next visit Plan to discuss Narcan  Orders: -     ToxASSURE Select 13 (MW), Urine; Future  Fatty liver Assessment & Plan: Patient had abdominal ultrasound in 2021.  It was notable for hepatic steatosis at that  time.    PDMP reviewed  Return in about 5 months (around 09/29/2022) for annual visit with fasting labs 1 week prior.  Carollee Leitz, MD

## 2022-04-30 ENCOUNTER — Ambulatory Visit (INDEPENDENT_AMBULATORY_CARE_PROVIDER_SITE_OTHER): Payer: Medicare Other | Admitting: Family Medicine

## 2022-04-30 ENCOUNTER — Encounter: Payer: Self-pay | Admitting: Family Medicine

## 2022-04-30 ENCOUNTER — Encounter: Payer: Medicare Other | Admitting: Family Medicine

## 2022-04-30 VITALS — BP 130/70 | HR 69 | Temp 98.2°F | Ht 65.0 in | Wt 172.4 lb

## 2022-04-30 DIAGNOSIS — E559 Vitamin D deficiency, unspecified: Secondary | ICD-10-CM

## 2022-04-30 DIAGNOSIS — R7303 Prediabetes: Secondary | ICD-10-CM

## 2022-04-30 DIAGNOSIS — I10 Essential (primary) hypertension: Secondary | ICD-10-CM

## 2022-04-30 DIAGNOSIS — E039 Hypothyroidism, unspecified: Secondary | ICD-10-CM

## 2022-04-30 DIAGNOSIS — M5440 Lumbago with sciatica, unspecified side: Secondary | ICD-10-CM

## 2022-04-30 DIAGNOSIS — K760 Fatty (change of) liver, not elsewhere classified: Secondary | ICD-10-CM

## 2022-04-30 DIAGNOSIS — M62838 Other muscle spasm: Secondary | ICD-10-CM | POA: Diagnosis not present

## 2022-04-30 DIAGNOSIS — E785 Hyperlipidemia, unspecified: Secondary | ICD-10-CM | POA: Diagnosis not present

## 2022-04-30 DIAGNOSIS — G8929 Other chronic pain: Secondary | ICD-10-CM

## 2022-04-30 DIAGNOSIS — G629 Polyneuropathy, unspecified: Secondary | ICD-10-CM

## 2022-04-30 DIAGNOSIS — Z0000 Encounter for general adult medical examination without abnormal findings: Secondary | ICD-10-CM

## 2022-04-30 MED ORDER — DIAZEPAM 2 MG PO TABS: ORAL_TABLET | ORAL | 0 refills | Status: DC

## 2022-05-03 ENCOUNTER — Telehealth: Payer: Self-pay | Admitting: Adult Health

## 2022-05-03 NOTE — Telephone Encounter (Signed)
I spoke with the patient. She said Tammy sent over an order to [place] for her CPAP supplies. She called [facility] about the supplies and they told her Medicare would not cover the supplies and she would need to contact them. She called Medicare and they told her she would need a new sleep study and she needs to be seen by an MD in order for them to pay for the supplies. She has an appointment to see Dr. Elsworth Soho on 06/06/2022. She is wanting to know if you will order a sleep study for her to be done before she sees him?

## 2022-05-03 NOTE — Telephone Encounter (Signed)
She was just seen with excellent control on CPAP , why does she need a sleep study , did something happen since last ov

## 2022-05-04 NOTE — Telephone Encounter (Signed)
Called [facility] and left message with Judeen Hammans for them to give me a call back in regards to this patient and if she is needing a sleep study.

## 2022-05-09 ENCOUNTER — Telehealth: Payer: Self-pay | Admitting: *Deleted

## 2022-05-09 NOTE — Telephone Encounter (Signed)
Called [employer] and spoke with the Manager Magda Paganini), she states that she had a different insurance when she first received her machine, but now she has Medicare.    She said it was fine that she had a visit with Tammy Parrett NP, she does not have to have a face to face visit with an MD, it can be a NP.  She also said she does not need a new sleep study.  She pulled her compliance and there is not a problem.  She said she will call billing and the patient to straighten things out.  She will also make sure the patient gets new supplies.  As far as Magda Paganini knew, nothing had changed with medicare stating that patients could no longer see Nps, that they had to see an NP and orders had to be signed by an MD.  Nothing further needed.

## 2022-05-12 ENCOUNTER — Encounter: Payer: Self-pay | Admitting: Family Medicine

## 2022-05-12 DIAGNOSIS — R7303 Prediabetes: Secondary | ICD-10-CM | POA: Insufficient documentation

## 2022-05-12 DIAGNOSIS — E559 Vitamin D deficiency, unspecified: Secondary | ICD-10-CM | POA: Insufficient documentation

## 2022-05-12 NOTE — Assessment & Plan Note (Signed)
Chronic opioids Need pain management contract signed at next visit Plan to discuss Narcan

## 2022-05-12 NOTE — Assessment & Plan Note (Signed)
Well-controlled per JNC 8 guidelines goal less than 140/90 for age Continue amlodipine 5 mg daily Follow-up at next visit

## 2022-05-12 NOTE — Assessment & Plan Note (Signed)
Continue gabapentin 300 mg nightly

## 2022-05-12 NOTE — Assessment & Plan Note (Addendum)
PDMP reviewed and appropriate Refill diazepam 2 mg x 30 tablets Continue Norco 5-325 mg 1 tablet twice daily x 30 tablets.  Last filled 11/2021 Continue gabapentin 300 mg at night Plan for UDS at next visit

## 2022-05-12 NOTE — Assessment & Plan Note (Signed)
Continue Valium 1 to 2 mg at night for severe spasm Refill x 30 tablets PDMP reviewed and appropriate

## 2022-05-12 NOTE — Assessment & Plan Note (Signed)
Patient had abdominal ultrasound in 2021.  It was notable for hepatic steatosis at that time.

## 2022-05-12 NOTE — Assessment & Plan Note (Signed)
Continue Synthroid 50 mcg daily Repeat TSH annually

## 2022-05-16 NOTE — Telephone Encounter (Signed)
See encounter from 12/13. Will close encounter.

## 2022-06-04 ENCOUNTER — Other Ambulatory Visit: Payer: Self-pay | Admitting: Family Medicine

## 2022-06-04 DIAGNOSIS — M5416 Radiculopathy, lumbar region: Secondary | ICD-10-CM

## 2022-06-04 DIAGNOSIS — M62838 Other muscle spasm: Secondary | ICD-10-CM

## 2022-06-06 ENCOUNTER — Encounter (HOSPITAL_BASED_OUTPATIENT_CLINIC_OR_DEPARTMENT_OTHER): Payer: Self-pay | Admitting: Pulmonary Disease

## 2022-06-06 ENCOUNTER — Ambulatory Visit (INDEPENDENT_AMBULATORY_CARE_PROVIDER_SITE_OTHER): Payer: Medicare Other | Admitting: Pulmonary Disease

## 2022-06-06 VITALS — BP 116/72 | HR 77 | Temp 98.4°F | Ht 65.0 in | Wt 173.6 lb

## 2022-06-06 DIAGNOSIS — G4733 Obstructive sleep apnea (adult) (pediatric): Secondary | ICD-10-CM | POA: Diagnosis not present

## 2022-06-06 DIAGNOSIS — R053 Chronic cough: Secondary | ICD-10-CM

## 2022-06-06 NOTE — Patient Instructions (Addendum)
CPAP is working well. X change to auto CPAP settings 5 to 12 cm -[facility]  Trial of chlorpheniramine 4 mg at bedtime x 3 weeks + phenylephrine 10 mg daytime  (combination [facility] brand 'sinus PE')  Report back in my chart how much improved, if not we can trial steroid inhaler  - blood work -[facility] panel  X Amb sat

## 2022-06-06 NOTE — Progress Notes (Signed)
   Subjective:    Patient ID: Tonya Green, female    DOB: [birth year], 72 y.o.   MRN: [id number]  HPI   Chief Complaint  Patient presents with  . Consult    Pt states she had a dry cough for a few years. Pt states it is getting worse.     Complains over the last 3 to 4 years since moving from [place] to the local area that she has had this intermittent episodes of watery eyes postnasal drip nasal drainage and intermittent dry cough.  Suspect secondary to postnasal drip  >> zyrtec   Significant tests/ events reviewed split-night in 2014, at that time she had mild obstructive sleep apnea with an AHI of 11.9, optimal control on 6 cm H2O   Past Medical History:  Diagnosis Date  . Basal cell carcinoma    left nasal sidewall 05/11/[facility] Dr. Winifred [facility]  . Carotid artery stenosis    mild L>R 03/26/16 [place]   . Cataract of left eye 05/25/2021  . Chronic cough 04/03/2022  . Diverticulosis    colonoscopy 10/13/16 [place]  . Factor 5 Leiden mutation, heterozygous ([place])   . Fatty liver   . Heterozygous factor V Leiden mutation ([place]) 11/01/2017  . History of blood transfusion    birth Rh factor   . History of skin cancer 11/01/2017   H/o BCC forehead and SCC nose   . Hyperlipidemia   . Hypertension   . Hyponatremia 12/18/2017  . Leukocytosis 12/18/2017  . OSA on CPAP   . Radiculopathy   . Right leg DVT ([place])    2010 s/p back surgery was on Coumadin x 6 months   . Thyroid disease    hypothyroidism   . UTI (urinary tract infection) 08/21/2018     Review of Systems     Objective:   Physical Exam        Assessment & Plan:

## 2022-06-07 NOTE — Assessment & Plan Note (Addendum)
Will treat for upper airway cough syndrome  Trial of chlorpheniramine 4 mg at bedtime x 3 weeks + phenylephrine 10 mg daytime  (combination [facility] brand 'sinus PE')  Report back in my chart how much improved, if not we can obtain high-resolution CT chest and trial steroid inhaler  - blood work -[facility] panel

## 2022-06-07 NOTE — Assessment & Plan Note (Signed)
CPAP download was reviewed which shows excellent control of events on auto settings with average pressure of 10 cm and maximum pressure of 11.  She is very compliant and CPAP is only helped improve her daytime somnolence and fatigue. We will change settings to auto CPAP 5 to 12 cm  compliance with goal of at least 4-6 hrs every night is the expectation. Advised against medications with sedative side effects Cautioned against driving when sleepy - understanding that sleepiness will vary on a day to day basis

## 2022-06-08 ENCOUNTER — Other Ambulatory Visit: Payer: Self-pay | Admitting: Family Medicine

## 2022-06-08 DIAGNOSIS — Z1231 Encounter for screening mammogram for malignant neoplasm of breast: Secondary | ICD-10-CM

## 2022-06-08 LAB — ALLERGENS W/TOTAL IGE AREA 2

## 2022-06-12 ENCOUNTER — Telehealth (HOSPITAL_BASED_OUTPATIENT_CLINIC_OR_DEPARTMENT_OTHER): Payer: Self-pay

## 2022-06-12 NOTE — Telephone Encounter (Signed)
-----  Message from Rigoberto Noel, MD sent at 06/11/2022  8:14 PM EST ----- No significant allergies

## 2022-06-12 NOTE — Telephone Encounter (Signed)
LMOM for Pt  to return call for results of her blood work.

## 2022-06-26 ENCOUNTER — Encounter (HOSPITAL_BASED_OUTPATIENT_CLINIC_OR_DEPARTMENT_OTHER): Payer: Self-pay | Admitting: Pulmonary Disease

## 2022-06-28 ENCOUNTER — Telehealth (HOSPITAL_BASED_OUTPATIENT_CLINIC_OR_DEPARTMENT_OTHER): Payer: Self-pay

## 2022-06-28 NOTE — Telephone Encounter (Signed)
Called Pt to inform her of the message from Dr Elsworth Soho. Pt stated understanding and nothing further needed at this time.

## 2022-07-12 ENCOUNTER — Telehealth: Payer: Self-pay | Admitting: Family Medicine

## 2022-07-12 ENCOUNTER — Other Ambulatory Visit: Payer: Self-pay

## 2022-07-12 DIAGNOSIS — E039 Hypothyroidism, unspecified: Secondary | ICD-10-CM

## 2022-07-12 MED ORDER — LEVOTHYROXINE SODIUM 50 MCG PO TABS: 50.0000 ug | ORAL_TABLET | Freq: Every day | ORAL | 3 refills | Status: DC

## 2022-07-12 NOTE — Telephone Encounter (Signed)
Prescription Request  07/12/2022  Is this a "Controlled Substance" medicine? no  LOV: 04/30/2022  What is the name of the medication or equipment? levothyroxine   Have you contacted your pharmacy to request a refill? Yes   Which pharmacy would you like this sent to?  [facility] [place] [place] [address] Phone: [phone] Fax: [phone]    Patient notified that their request is being sent to the clinical staff for review and that they should receive a response within 2 business days.   Please advise at Mobile [phone] (mobile)

## 2022-07-12 NOTE — Telephone Encounter (Signed)
sent

## 2022-07-16 ENCOUNTER — Ambulatory Visit
Admission: RE | Admit: 2022-07-16 | Discharge: 2022-07-16 | Disposition: A | Discharge status: Home / Self Care | Payer: Medicare Other | Source: Ambulatory Visit | Attending: Family Medicine | Admitting: Family Medicine

## 2022-07-16 DIAGNOSIS — Z1231 Encounter for screening mammogram for malignant neoplasm of breast: Secondary | ICD-10-CM | POA: Insufficient documentation

## 2022-09-05 ENCOUNTER — Ambulatory Visit (HOSPITAL_BASED_OUTPATIENT_CLINIC_OR_DEPARTMENT_OTHER): Payer: Medicare Other | Admitting: Pulmonary Disease

## 2022-09-24 ENCOUNTER — Ambulatory Visit (INDEPENDENT_AMBULATORY_CARE_PROVIDER_SITE_OTHER): Payer: Medicare Other | Admitting: Pulmonary Disease

## 2022-09-24 ENCOUNTER — Encounter (HOSPITAL_BASED_OUTPATIENT_CLINIC_OR_DEPARTMENT_OTHER): Payer: Self-pay | Admitting: Pulmonary Disease

## 2022-09-24 ENCOUNTER — Other Ambulatory Visit (INDEPENDENT_AMBULATORY_CARE_PROVIDER_SITE_OTHER): Payer: Medicare Other

## 2022-09-24 VITALS — BP 126/86 | HR 77 | Temp 98.0°F | Ht 65.0 in | Wt 181.2 lb

## 2022-09-24 DIAGNOSIS — R7303 Prediabetes: Secondary | ICD-10-CM

## 2022-09-24 DIAGNOSIS — E559 Vitamin D deficiency, unspecified: Secondary | ICD-10-CM | POA: Diagnosis not present

## 2022-09-24 DIAGNOSIS — G4733 Obstructive sleep apnea (adult) (pediatric): Secondary | ICD-10-CM

## 2022-09-24 DIAGNOSIS — I10 Essential (primary) hypertension: Secondary | ICD-10-CM

## 2022-09-24 DIAGNOSIS — G8929 Other chronic pain: Secondary | ICD-10-CM

## 2022-09-24 DIAGNOSIS — R053 Chronic cough: Secondary | ICD-10-CM

## 2022-09-24 DIAGNOSIS — E785 Hyperlipidemia, unspecified: Secondary | ICD-10-CM

## 2022-09-24 LAB — COMPREHENSIVE METABOLIC PANEL
ALT: 31 U/L (ref 0–35)
AST: 25 U/L (ref 0–37)
Albumin: 4.1 g/dL (ref 3.5–5.2)
Alkaline Phosphatase: 84 U/L (ref 39–117)
BUN: 13 mg/dL (ref 6–23)
CO2: 26 mEq/L (ref 19–32)
Calcium: 9.3 mg/dL (ref 8.4–10.5)
Chloride: 102 mEq/L (ref 96–112)
Creatinine, Ser: 0.71 mg/dL (ref 0.40–1.20)
GFR: 84.8 mL/min (ref >60.00)
Glucose, Bld: 103 mg/dL — ABNORMAL HIGH (ref 70–99)
Potassium: 4 mEq/L (ref 3.5–5.1)
Sodium: 137 mEq/L (ref 135–145)
Total Bilirubin: 0.5 mg/dL (ref 0.2–1.2)
Total Protein: 7.5 g/dL (ref 6.0–8.3)

## 2022-09-24 LAB — LIPID PANEL
Cholesterol: 303 mg/dL — ABNORMAL HIGH (ref 0–200)
HDL: 51.5 mg/dL (ref >39.00)
LDL Cholesterol: 221 mg/dL — ABNORMAL HIGH (ref 0–99)
NonHDL: 251.15
Total CHOL/HDL Ratio: 6
Triglycerides: 150 mg/dL — ABNORMAL HIGH (ref 0.0–149.0)
VLDL: 30 mg/dL (ref 0.0–40.0)

## 2022-09-24 LAB — CBC WITH DIFFERENTIAL/PLATELET
Basophils Absolute: 0.1 10*3/uL (ref 0.0–0.1)
Basophils Relative: 1.1 % (ref 0.0–3.0)
Eosinophils Absolute: 0.2 10*3/uL (ref 0.0–0.7)
Eosinophils Relative: 3.1 % (ref 0.0–5.0)
HCT: 40 % (ref 36.0–46.0)
Hemoglobin: 13.2 g/dL (ref 12.0–15.0)
Lymphocytes Relative: 39.6 % (ref 12.0–46.0)
Lymphs Abs: 2.4 10*3/uL (ref 0.7–4.0)
MCHC: 32.9 g/dL (ref 30.0–36.0)
MCV: 92.6 fl (ref 78.0–100.0)
Monocytes Absolute: 0.5 10*3/uL (ref 0.1–1.0)
Monocytes Relative: 8.9 % (ref 3.0–12.0)
Neutro Abs: 2.8 10*3/uL (ref 1.4–7.7)
Neutrophils Relative %: 47.3 % (ref 43.0–77.0)
Platelets: 281 10*3/uL (ref 150.0–400.0)
RBC: 4.32 Mil/uL (ref 3.87–5.11)
RDW: 13.5 % (ref 11.5–15.5)
WBC: 6 10*3/uL (ref 4.0–10.5)

## 2022-09-24 LAB — HEMOGLOBIN A1C: Hgb A1c MFr Bld: 6.1 % (ref 4.6–6.5)

## 2022-09-24 LAB — VITAMIN D 25 HYDROXY (VIT D DEFICIENCY, FRACTURES): VITD: 48.12 ng/mL (ref 30.00–100.00)

## 2022-09-24 LAB — VITAMIN B12: Vitamin B-12: >1500 pg/mL — ABNORMAL HIGH (ref 211–911)

## 2022-09-24 MED ORDER — BECLOMETHASONE DIPROP HFA 80 MCG/ACT IN AERB: 1.0000 | INHALATION_SPRAY | Freq: Every day | RESPIRATORY_TRACT | 0 refills | Status: DC

## 2022-09-24 NOTE — Progress Notes (Signed)
   Subjective:    Patient ID: Tonya Green, female    DOB: 03/04/1951, 72 y.o.   MRN: [id number]  HPI  71-y o woman for FU of OSA and chronic cough   OSA was diagnosed in 2014 and  maintained on CPAP since then.  She received a new CPAP in 2020 when she saw Dr. Nicholos Johns.  She has settled down with nasal mask, [employer]  PMH -Factor 5 [place] carrier history of right lower extremity DVT, daughter has history of PE -Collagenous colitis on oral budesonide   58-month follow-up visit. Initial office visit 05/2022 -impression was upper airway cough syndrome.  No evidence of reflux or asthma While she was taking Chrlorpheniramine at night and Phenylephrine in the morning , coughing decreased and she was able to sleep better.  Cough returned when she stopped taking this medication. She has been taking this intermittently.  She denies wheezing, sputum production or fevers.  No significant reflux symptoms. She took oral budesonide for a month for collagenous colitis.  She remains on gabapentin  Very compliant with CPAP denies any problems with mask or pressure.  We changed her settings on last office visit to 5 to 12 cm  Chest x-ray 03/2022 showed left basilar linear atelectasis     Significant tests/ events reviewed split-PSG 2014 >> mild obstructive sleep apnea with an AHI of 11.9, optimal control on 6 cm H2O   Review of Systems Patient denies significant dyspnea,cough, hemoptysis,  chest pain, palpitations, pedal edema, orthopnea, paroxysmal nocturnal dyspnea, lightheadedness, nausea, vomiting, abdominal or  leg pains      Objective:   Physical Exam  Gen. Pleasant, obese, in no distress ENT - no lesions, no post nasal drip Neck: No JVD, no thyromegaly, no carotid bruits Lungs: no use of accessory muscles, no dullness to percussion, decreased without rales or rhonchi  Cardiovascular: Rhythm regular, heart sounds  normal, no murmurs or gallops, no peripheral edema Musculoskeletal: No  deformities, no cyanosis or clubbing , no tremors        Assessment & Plan:

## 2022-09-24 NOTE — Patient Instructions (Signed)
OK to take chlorpheniramine until mid may then prn only  X Rx for Qvar 80 mcg 1 puff daily x 1 month - call back to report if cough gets better

## 2022-09-24 NOTE — Assessment & Plan Note (Signed)
Some relief with chlorpheniramine suggesting at least partial effect of upper airway cough syndrome. She does not have overt reflux or wheezing and no prior history of asthma. We discussed side effects of long-term chlorpheniramine.  Would like to trial inhaled steroid for cough variant asthma since she prefers to avoid testing.  We will give her prescription for Qvar. At this time cough is simply a nuisance

## 2022-09-24 NOTE — Assessment & Plan Note (Signed)
CPAP download was reviewed which shows excellent control of events on auto settings with average pressure of 10 maximum pressure of 11 cm.  She is very compliant.  CPAP is only helped improve her daytime somnolence and fatigue  Weight loss encouraged, compliance with goal of at least 4-6 hrs every night is the expectation. Advised against medications with sedative side effects Cautioned against driving when sleepy - understanding that sleepiness will vary on a day to day basis

## 2022-09-25 ENCOUNTER — Encounter (HOSPITAL_BASED_OUTPATIENT_CLINIC_OR_DEPARTMENT_OTHER): Payer: Self-pay | Admitting: Pulmonary Disease

## 2022-09-26 NOTE — Telephone Encounter (Signed)
Pharmacy, please see pt's message regarding Qvar. Please advise if there are any covered alternatives. Thanks.

## 2022-09-28 LAB — TOXASSURE SELECT 13 (MW), URINE

## 2022-10-01 ENCOUNTER — Encounter: Payer: Self-pay | Admitting: Family Medicine

## 2022-10-01 ENCOUNTER — Ambulatory Visit (INDEPENDENT_AMBULATORY_CARE_PROVIDER_SITE_OTHER): Payer: Medicare Other | Admitting: Family Medicine

## 2022-10-01 ENCOUNTER — Other Ambulatory Visit (HOSPITAL_COMMUNITY): Payer: Self-pay

## 2022-10-01 VITALS — BP 138/78 | HR 89 | Ht 65.0 in | Wt 180.0 lb

## 2022-10-01 DIAGNOSIS — Z136 Encounter for screening for cardiovascular disorders: Secondary | ICD-10-CM

## 2022-10-01 DIAGNOSIS — E039 Hypothyroidism, unspecified: Secondary | ICD-10-CM

## 2022-10-01 DIAGNOSIS — M5416 Radiculopathy, lumbar region: Secondary | ICD-10-CM

## 2022-10-01 DIAGNOSIS — L309 Dermatitis, unspecified: Secondary | ICD-10-CM

## 2022-10-01 DIAGNOSIS — G629 Polyneuropathy, unspecified: Secondary | ICD-10-CM

## 2022-10-01 DIAGNOSIS — M62838 Other muscle spasm: Secondary | ICD-10-CM

## 2022-10-01 DIAGNOSIS — R0982 Postnasal drip: Secondary | ICD-10-CM

## 2022-10-01 DIAGNOSIS — I10 Essential (primary) hypertension: Secondary | ICD-10-CM | POA: Diagnosis not present

## 2022-10-01 DIAGNOSIS — E785 Hyperlipidemia, unspecified: Secondary | ICD-10-CM

## 2022-10-01 MED ORDER — FLUTICASONE PROPIONATE 50 MCG/ACT NA SUSP
2.0000 | Freq: Every day | NASAL | 6 refills | Status: DC
Start: 2022-10-01 — End: 2024-01-31

## 2022-10-01 MED ORDER — FEXOFENADINE HCL 180 MG PO TABS: 180.0000 mg | ORAL_TABLET | Freq: Every day | ORAL | 3 refills | Status: DC

## 2022-10-01 MED ORDER — HYDROCORTISONE 2.5 % EX OINT: TOPICAL_OINTMENT | Freq: Two times a day (BID) | CUTANEOUS | 0 refills | Status: DC

## 2022-10-01 NOTE — Progress Notes (Signed)
SUBJECTIVE:   Chief Complaint  Patient presents with   Medical Management of Chronic Issues   HPI Patient presents to clinic for follow-up chronic disease management.  Hypertension Asymptomatic. Takes amlodipine 2.5 mg daily and tolerating well.  Blood pressures at home 114/74.  Hyperlipidemia Recent lipid profile shows elevated LDL and triglycerides.  Have been chronically elevated in the past.  Not currently on statin therapy.  Would prefer to stay off statins if possible.  Offered calcium score and agreeable to proceed with imaging.    Vitamin B12 elevated Has cut B12 supplements in half.  Was previously taking 1000 mcg sublingual.  Chronic opioid and benzodiazepine use. Requires none opioid and opioid contract today.  Has history of chronic low back pain with sciatica/lumbar radiculopathy. Prescribed Valium 1-2 mg as needed for flareups.  Norco 5-3 25 1  twice daily for flareups.  Currently taking gabapentin 300 mg 3 times daily for pain management.  Dermatitis Small circular flat rash to right lower flank area.  Itching with mild erythema.  No vesicles noted.  No change in detergents, body wash, lotions.  Has been in the pool recently.    Left ear discharge Recently in pool.  Has noted some discharge from left ear.  No decrease in hearing, tinnitus, gait disturbance or vertigo.  Denies any fevers or pain.  Reports slightly uncomfortable.  PERTINENT PMH / PSH: Hypertension Hyperlipidemia OSA on CPAP Muscle spasms Lumbar radiculopathy Chronic low back pain Factor V Leiden mutation Chronic intermittent opioid use Chronic intermittent benzo use   OBJECTIVE:  BP 138/78   Pulse 89   Ht 5\' 5"  (1.651 m)   Wt 180 lb (81.6 kg)   SpO2 95%   BMI 29.95 kg/m    Physical Exam Vitals reviewed.  Constitutional:      General: She is not in acute distress.    Appearance: Normal appearance. She is normal weight. She is not ill-appearing, toxic-appearing or diaphoretic.   HENT:     Right Ear: Hearing normal.     Left Ear: Hearing, ear canal and external ear normal. A middle ear effusion is present.  Eyes:     General:        Right eye: No discharge.        Left eye: No discharge.     Conjunctiva/sclera: Conjunctivae normal.  Cardiovascular:     Rate and Rhythm: Normal rate and regular rhythm.     Heart sounds: Normal heart sounds.  Pulmonary:     Effort: Pulmonary effort is normal.     Breath sounds: Normal breath sounds.  Abdominal:     General: Bowel sounds are normal.  Musculoskeletal:        General: Normal range of motion.  Skin:    General: Skin is warm and dry.     Findings: Rash present.  Neurological:     General: No focal deficit present.     Mental Status: She is alert and oriented to person, place, and time. Mental status is at baseline.  Psychiatric:        Mood and Affect: Mood normal.        Behavior: Behavior normal.        Thought Content: Thought content normal.        Judgment: Judgment normal.     ASSESSMENT/PLAN:  Dermatitis Assessment & Plan: Chronic.  Rash consistent with dermatitis.  Considered ringworm however no raised borders. Trial hydrocortisone 2.5% cream if no improvement can treat for fungal infection.  Orders: -     Hydrocortisone; Apply topically 2 (two) times daily. Apply to affected area two times a day as needed for 5 days  Dispense: 30 g; Refill: 0  Essential hypertension Assessment & Plan: Chronic.  Well-controlled per JNC 8 guidelines goal less than 150/90 for age Continue amlodipine 2.5 mg daily Continue to monitor blood pressure at home. Recent creatinine within normal limits.    Post-nasal drip Assessment & Plan: Left middle ear effusion, without drainage, decreased hearing.  Suspect due to nasal congestion. Restart Flonase 2 sprays daily Restart Allegra 180 mg daily If no improvement will treat with antibiotics  Orders: -     Fluticasone Propionate; Place 2 sprays into both  nostrils daily.  Dispense: 16 g; Refill: 6 -     Fexofenadine HCl; Take 1 tablet (180 mg total) by mouth daily.  Dispense: 90 tablet; Refill: 3  Ischemic heart disease screen -     CT CARDIAC SCORING (SELF PAY ONLY); Future  Hypothyroidism, unspecified type Assessment & Plan: Chronic.  Asymptomatic.  Recent TSH within normal limits Continue Synthroid 50 mcg daily     Neuropathy Assessment & Plan: Continue gabapentin 300 mg 3 times daily nightly   Lumbar radiculopathy Assessment & Plan: Chronic.  Stable Recent tox assure negative for benzos and opioids Consistent with intermittent use of Valium and Norco. Continue Valium 1-2 mg daily as needed Continue Norco 5-325 mg 1 tablet twice daily as needed PDMP reviewed and appropriate    Muscle spasm Assessment & Plan: Chronic.  Stable.   Continue Valium 1 to 2 mg at night for severe spasm PDMP reviewed and appropriate   Hyperlipidemia, unspecified hyperlipidemia type Assessment & Plan: Chronic.  Elevated LDL and triglycerides consistent over the years. Not currently on statin therapy.  Patient preferred to stay off statin if possible. Agreed to cardiac CT. Follow-up with results   HCM Declined pneumonia vaccine Medicare annual wellness due Mammogram due 06/2023 Colonoscopy up-to-date.  PDMP reviewed  Return in about 2 weeks (around 10/15/2022) for PCP.  Dana Allan, MD

## 2022-10-01 NOTE — Patient Instructions (Addendum)
It was a pleasure meeting you today. Thank you for allowing me to take part in your health care.  Our goals for today as we discussed include:  Calcium score/ Cardiac CT.  This is a 3D image of the heart that measures the calcium deposits in the coronary arteries to determine risk of heart disease. It is a $99 self-pay exam . The imaging is done at [facility].   Schedule Medicare Annual Wellness Visit   Recommend Pneumonia 20 vaccine.  One time vaccination  Start Hydrocortisone cream two times a day for 5 days to area on buttock  Start Flonase 2 sprays daily Start Allegra 180 mg daily If no improvement in left ear follow up with PCP  Follow up in 2 weeks  If you have any questions or concerns, please do not hesitate to call the office at [phone].  I look forward to our next visit and until then take care and stay safe.  Regards,   Dana Allan, MD   [facility]

## 2022-10-01 NOTE — Telephone Encounter (Signed)
Insurance preferred are as follows:   Pulmicort Flexhaler [id number] [employer] Digihaler $265.06  It's possible patient has a deductible, but test claims do not show.

## 2022-10-01 NOTE — Telephone Encounter (Signed)
Will forward to Dr. Vassie Loll to advise further. Thanks.

## 2022-10-02 ENCOUNTER — Encounter: Payer: Self-pay | Admitting: Family Medicine

## 2022-10-04 ENCOUNTER — Telehealth: Payer: Self-pay | Admitting: Family Medicine

## 2022-10-04 NOTE — Telephone Encounter (Signed)
Contacted Tonya Green to schedule their annual wellness visit. Appointment made for 10/17/2022.   Tonya Green; Care Guide Ambulatory Clinical Support  l [facility] Direct Dial: [phone]

## 2022-10-04 NOTE — Telephone Encounter (Signed)
Contacted Tonya Green to schedule their annual wellness visit. Patient declined to schedule AWV at this time. Spoke with patient spouse he stated to call home numbder  Verlee Rossetti; Care Guide Ambulatory Clinical Support [place] [facility] Direct Dial: [phone]

## 2022-10-05 ENCOUNTER — Ambulatory Visit: Payer: Medicare Other

## 2022-10-14 DIAGNOSIS — R0982 Postnasal drip: Secondary | ICD-10-CM | POA: Insufficient documentation

## 2022-10-14 DIAGNOSIS — Z136 Encounter for screening for cardiovascular disorders: Secondary | ICD-10-CM | POA: Insufficient documentation

## 2022-10-14 DIAGNOSIS — L309 Dermatitis, unspecified: Secondary | ICD-10-CM | POA: Insufficient documentation

## 2022-10-14 NOTE — Assessment & Plan Note (Addendum)
Left middle ear effusion, without drainage, decreased hearing.  Suspect due to nasal congestion. Restart Flonase 2 sprays daily Restart Allegra 180 mg daily If no improvement will treat with antibiotics

## 2022-10-14 NOTE — Assessment & Plan Note (Signed)
Chronic.  Stable Recent tox assure negative for benzos and opioids Consistent with intermittent use of Valium and Norco. Continue Valium 1-2 mg daily as needed Continue Norco 5-325 mg 1 tablet twice daily as needed PDMP reviewed and appropriate

## 2022-10-14 NOTE — Assessment & Plan Note (Signed)
Chronic.  Elevated LDL and triglycerides consistent over the years. Not currently on statin therapy.  Patient preferred to stay off statin if possible. Agreed to cardiac CT. Follow-up with results

## 2022-10-14 NOTE — Assessment & Plan Note (Signed)
Chronic.  Stable.   Continue Valium 1 to 2 mg at night for severe spasm PDMP reviewed and appropriate

## 2022-10-14 NOTE — Assessment & Plan Note (Signed)
Chronic.  Rash consistent with dermatitis.  Considered ringworm however no raised borders. Trial hydrocortisone 2.5% cream if no improvement can treat for fungal infection.

## 2022-10-14 NOTE — Assessment & Plan Note (Signed)
Continue gabapentin 300 mg 3 times daily nightly

## 2022-10-14 NOTE — Assessment & Plan Note (Signed)
Chronic.  Asymptomatic.  Recent TSH within normal limits Continue Synthroid 50 mcg daily

## 2022-10-14 NOTE — Assessment & Plan Note (Signed)
Chronic.  Well-controlled per JNC 8 guidelines goal less than 150/90 for age Continue amlodipine 2.5 mg daily Continue to monitor blood pressure at home. Recent creatinine within normal limits.

## 2022-10-15 ENCOUNTER — Ambulatory Visit: Payer: Medicare Other | Admitting: Family Medicine

## 2022-10-15 DIAGNOSIS — Z1231 Encounter for screening mammogram for malignant neoplasm of breast: Secondary | ICD-10-CM

## 2022-10-17 ENCOUNTER — Ambulatory Visit (INDEPENDENT_AMBULATORY_CARE_PROVIDER_SITE_OTHER): Payer: Medicare Other

## 2022-10-17 ENCOUNTER — Encounter: Payer: Self-pay | Admitting: Family Medicine

## 2022-10-17 DIAGNOSIS — Z0000 Encounter for general adult medical examination without abnormal findings: Secondary | ICD-10-CM

## 2022-10-17 NOTE — Patient Instructions (Signed)

## 2022-10-17 NOTE — Progress Notes (Signed)
I connected with  Robinette Haines on 10/17/22 by a audio enabled telemedicine application and verified that I am speaking with the correct person using two identifiers.  Patient Location: Home  Provider Location: Home Office  I discussed the limitations of evaluation and management by telemedicine. The patient expressed understanding and agreed to proceed.   Subjective:   Tonya Green is a 72 y.o. female who presents for Medicare Annual (Subsequent) preventive examination.  Review of Systems    Per HPI unless specifically indicated below.  Cardiac Risk Factors include: advanced age (>67men, >45 women);female gender, essential Hypertension, and Hyperlipidemia.          Objective:       10/01/2022    9:00 AM 09/24/2022    3:08 PM 06/06/2022    9:46 AM  Vitals with BMI  Height 5\' 5"  5\' 5"  5\' 5"   Weight 180 lbs 181 lbs 3 oz 173 lbs 10 oz  BMI 29.95 30.15 28.89  Systolic 138 126 409  Diastolic 78 86 72  Pulse 89 77 77    There were no vitals filed for this visit. There is no height or weight on file to calculate BMI.     10/17/2022    2:14 PM 01/01/2022   11:58 AM 09/13/2021    8:28 AM 08/05/2020    5:24 AM 08/05/2020    5:23 AM 06/21/2020    1:30 PM 06/19/2019    1:07 PM  Advanced Directives  Does Patient Have a Medical Advance Directive? Yes Yes Yes Yes No Yes Yes  Type of Estate agent of [place];Living will;Out of facility DNR (pink MOST or yellow form) Healthcare Power of [place];Living will Healthcare Power of [place];Living will Out of facility DNR (pink MOST or yellow form);Healthcare Power of [place];Living will  Healthcare Power of [place];Living will Out of facility DNR (pink MOST or yellow form)  Does patient want to make changes to medical advance directive? No - Patient declined  No - Patient declined   No - Patient declined No - Patient declined  Copy of Healthcare Power of Attorney in Chart? Yes - validated most recent copy scanned in chart (See  row information) No - copy requested Yes - validated most recent copy scanned in chart (See row information)   Yes - validated most recent copy scanned in chart (See row information) Yes - validated most recent copy scanned in chart (See row information)  Would patient like information on creating a medical advance directive?  No - Patient declined         Current Medications (verified) Outpatient Encounter Medications as of 10/17/2022  Medication Sig   amLODipine (NORVASC) 2.5 MG tablet Take 1 tablet (2.5 mg total) by mouth daily. In am if BP>140/?90 take another tablet   Ascorbic Acid (VITAMIN C) 1000 MG tablet Take 1,000 mg by mouth 3 (three) times daily.   Cholecalciferol (VITAMIN D3) 5000 units CAPS Take by mouth.   diazepam (VALIUM) 2 MG tablet TAKE 1/2 TO 1 TABLET BY MOUTH EVERY NIGHT AT BEDTIME AS NEEDED   fexofenadine (ALLEGRA ALLERGY) 180 MG tablet Take 1 tablet (180 mg total) by mouth daily.   fluticasone (FLONASE) 50 MCG/ACT nasal spray Place 2 sprays into both nostrils daily.   gabapentin (NEURONTIN) 100 MG capsule TAKE 3 CAPSULES(300 MG) BY MOUTH THREE TIMES DAILY   HYDROcodone-acetaminophen (NORCO/VICODIN) 5-325 MG tablet Take 1 tablet by mouth 2 (two) times daily as needed (only for flare ups).   hydrocortisone 2.5 %  ointment Apply topically 2 (two) times daily. Apply to affected area two times a day as needed for 5 days   levothyroxine (SYNTHROID) 50 MCG tablet Take 1 tablet (50 mcg total) by mouth daily before breakfast.   MAGNESIUM MALATE PO Take 625 mg by mouth.   Nattokinase 100 MG CAPS Take 100 mg by mouth daily.   Strontium Chloride POWD Use as directed 500 mg in the mouth or throat.    No facility-administered encounter medications on file as of 10/17/2022.    Allergies (verified) Macrobid [nitrofurantoin macrocrystal] and Sulfa antibiotics   History: Past Medical History:  Diagnosis Date   Basal cell carcinoma    left nasal sidewall 05/11/19 [facility]   Carotid artery stenosis    mild L>R 03/26/16 [place]    Cataract of left eye 05/25/2021   Chronic cough 04/03/2022   Diverticulosis    colonoscopy 10/13/16 [place]   Factor 5 Leiden mutation, heterozygous (HCC)    Fatty liver    Heterozygous factor V Leiden mutation (HCC) 11/01/2017   History of blood transfusion    birth Rh factor    History of skin cancer 11/01/2017   H/o BCC forehead and SCC nose    Hyperlipidemia    Hypertension    Hyponatremia 12/18/2017   Leukocytosis 12/18/2017   OSA on CPAP    Radiculopathy    Right leg DVT (HCC)    2010 s/p back surgery was on Coumadin x 6 months    Thyroid disease    hypothyroidism    UTI (urinary tract infection) 08/21/2018   Past Surgical History:  Procedure Laterality Date   APPENDECTOMY     2000   BACK SURGERY     BREAST EXCISIONAL BIOPSY Left 2005   for infection   BREAST SURGERY     biospy ? year    COLONOSCOPY     COLONOSCOPY WITH PROPOFOL N/A 01/01/2022   Procedure: COLONOSCOPY WITH PROPOFOL;  Surgeon: Regis Bill, MD;  Location: [facility] ENDOSCOPY;  Service: Endoscopy;  Laterality: N/A;   MOHS SURGERY  05/11/2019   left nasal side wall bcc Dr. Jeannine Boga in [place]    ruptured disc repair     L4/5 10/2008 with complications residual right leg weakness/reduced sensation and DVT in 2010 post surgery    Family History  Problem Relation Age of Onset   Hypertension Mother    Stroke Mother        in 27s    Heart disease Father    Stroke Father    Cancer Brother        esophageal cancer    Stroke Paternal Grandfather    Other Daughter        chronic back pain nerve sheath abnormal in spine s/p surgery   Breast cancer Paternal Aunt    Hyperlipidemia Other    Social History   Socioeconomic History   Marital status: Married    Spouse name: Not on file   Number of children: 2   Years of education: Not on file   Highest education level: Not on file  Occupational History   Occupation: Retired   Tobacco Use   Smoking status: Never   Smokeless tobacco: Never  Vaping Use   Vaping Use: Never used  Substance and Sexual Activity   Alcohol use: Yes    Comment: occ   Drug use: Never   Sexual activity: Not on file  Other Topics Concern   Not on file  Social History Narrative   BSN CCM, RN   Moved from [place] recently    2 daughters    Retired and married    No guns, wears seat belt, safe in relationship    Lives at [employer]    Social Determinants of Health   Financial Resource Strain: Low Risk  (10/17/2022)   Overall Financial Resource Strain (CARDIA)    Difficulty of Paying Living Expenses: Not hard at all  Food Insecurity: No Food Insecurity (10/17/2022)   Hunger Vital Sign    Worried About Running Out of Food in the Last Year: Never true    Ran Out of Food in the Last Year: Never true  Transportation Needs: No Transportation Needs (10/17/2022)   PRAPARE - Administrator, Civil Service (Medical): No    Lack of Transportation (Non-Medical): No  Physical Activity: Sufficiently Active (10/17/2022)   Exercise Vital Sign    Days of Exercise per Week: 5 days    Minutes of Exercise per Session: 30 min  Stress: No Stress Concern Present (10/17/2022)   [employer] of Occupational Health - Occupational Stress Questionnaire    Feeling of Stress : Not at all  Social Connections: Moderately Integrated (10/17/2022)   Social Connection and Isolation Panel [NHANES]    Frequency of Communication with Friends and Family: More than three times a week    Frequency of Social Gatherings with Friends and Family: More than three times a week    Attends Religious Services: Never    Database administrator or Organizations: Yes    Attends Engineer, structural: More than 4 times per year    Marital Status: Married    Tobacco Counseling Counseling given: No   Clinical Intake:  Pre-visit preparation completed: No  Pain : No/denies pain     Nutritional  Status: BMI 25 -29 Overweight Nutritional Risks: Unintentional weight gain Diabetes: No  How often do you need to have someone help you when you read instructions, pamphlets, or other written materials from your doctor or pharmacy?: 1 - Never  Diabetic?No   Interpreter Needed?: No  Information entered by :: Laurel Dimmer, CMA   Activities of Daily Living    10/17/2022    2:03 PM  In your present state of health, do you have any difficulty performing the following activities:  Hearing? 1  Vision? 1  Comment [facility]  Difficulty concentrating or making decisions? 0  Walking or climbing stairs? 1  Dressing or bathing? 0  Doing errands, shopping? 0    Patient Care Team: Dana Allan, MD as PCP - General (Family Medicine)  Indicate any recent Medical Services you may have received from other than [facility] providers in the past year (date may be approximate).     Assessment:   This is a routine wellness examination for Akane  Hearing/Vision screen Denies any hearing issues. Denies any change to her vision. Wear glasses. Annual Eye Exam.   Dietary issues and exercise activities discussed: Current Exercise Habits: Structured exercise class, Type of exercise: walking, Frequency (Times/Week): 5, Intensity: Moderate, Exercise limited by: None identified   Goals Addressed   None    Depression Screen    10/17/2022    2:02 PM 10/01/2022    9:20 AM 04/30/2022    3:06 PM 09/28/2021    9:59 AM 09/13/2021    8:26 AM 07/21/2021    2:29 PM 04/12/2021   11:46 AM  PHQ 2/9 Scores  PHQ - 2  Score 0 0 0 0 0 0 1  PHQ- 9 Score  2         Fall Risk    10/17/2022    2:03 PM 10/01/2022    9:20 AM 04/30/2022    3:05 PM 09/28/2021    9:59 AM 09/13/2021    8:28 AM  Fall Risk   Falls in the past year? 0 0 0 0 0  Number falls in past yr: 0  0 0 0  Injury with Fall? 0  0 0   Risk for fall due to : No Fall Risks  No Fall Risks No Fall Risks   Follow up Falls evaluation completed  Falls  evaluation completed Falls evaluation completed Falls evaluation completed    FALL RISK PREVENTION PERTAINING TO THE HOME:  Any stairs in or around the home? No  If so, are there any without handrails? No  Home free of loose throw rugs in walkways, pet beds, electrical cords, etc? Yes  Adequate lighting in your home to reduce risk of falls? Yes   ASSISTIVE DEVICES UTILIZED TO PREVENT FALLS:  Life alert? Yes  Use of a cane, walker or w/c? No  Grab bars in the bathroom? Yes  Shower chair or bench in shower? No  Elevated toilet seat or a handicapped toilet? Yes   TIMED UP AND GO:  Was the test performed?Unable to perform, virtual appointment   Cognitive Function:        10/17/2022    2:06 PM 06/19/2019    1:17 PM 06/17/2018    8:50 AM  6CIT Screen  What Year? 0 points 0 points 0 points  What month? 0 points 0 points 0 points  What time? 0 points 0 points 0 points  Count back from 20 0 points  0 points  Months in reverse 0 points  0 points  Repeat phrase 2 points  0 points  Total Score 2 points  0 points    Immunizations Immunization History  Administered Date(s) Administered   Fluad Quad(high Dose 65+) 03/18/2022   Influenza, High Dose Seasonal PF 03/18/2018, 02/01/2019, 02/18/2020   Influenza-Unspecified 03/08/2021   Moderna Covid-19 Vaccine Bivalent Booster 48yrs & up 02/07/2021, 11/21/2021   PFIZER Comirnaty(Gray Top)Covid-19 Tri-Sucrose Vaccine 07/10/2020   PFIZER(Purple Top)SARS-COV-2 Vaccination 06/03/2019, 07/01/2019, 01/17/2020, 03/07/2022   RSV IGIV 01/04/2022   Tdap 06/29/2015   Unspecified SARS-COV-2 Vaccination 03/07/2022   Zoster Recombinat (Shingrix) 06/30/2018, 10/06/2018, 11/06/2018    TDAP status: Up to date  Flu Vaccine status: Up to date  Pneumococcal vaccine status: Due, Education has been provided regarding the importance of this vaccine. Advised may receive this vaccine at local pharmacy or Health Dept. Aware to provide a copy of the  vaccination record if obtained from local pharmacy or Health Dept. Verbalized acceptance and understanding.  Covid-19 vaccine status: Information provided on how to obtain vaccines.   Qualifies for Shingles Vaccine? Yes   Zostavax completed Yes   Shingrix Completed?: Yes  Screening Tests Health Maintenance  Topic Date Due   Pneumonia Vaccine 41+ Years old (1 of 1 - PCV) Never done   COVID-19 Vaccine (8 - 2023-24 season) 05/02/2022   INFLUENZA VACCINE  12/27/2022   MAMMOGRAM  07/17/2023   Medicare Annual Wellness (AWV)  10/17/2023   DTaP/Tdap/Td (2 - Td or Tdap) 06/28/2025   COLONOSCOPY (Pts 45-54yrs Insurance coverage will need to be confirmed)  01/04/2032   DEXA SCAN  Completed   Hepatitis C Screening  Completed   Zoster  Vaccines- Shingrix  Completed   HPV VACCINES  Aged Out    Health Maintenance  Health Maintenance Due  Topic Date Due   Pneumonia Vaccine 82+ Years old (1 of 1 - PCV) Never done   COVID-19 Vaccine (8 - 2023-24 season) 05/02/2022    Colorectal cancer screening: Type of screening: Colonoscopy. Completed 08/09.2023. Repeat every 10 years  Mammogram status: Completed 10/17/2022. Repeat every year  DEXA Scan:07/10/2021  Lung Cancer Screening: (Low Dose CT Chest recommended if Age 60-80 years, 30 pack-year currently smoking OR have quit w/in 15years.) does not qualify.   Lung Cancer Screening Referral: not applicable  Additional Screening:  Hepatitis C Screening: does qualify; Completed 01/10/2017  Vision Screening: Recommended annual ophthalmology exams for early detection of glaucoma and other disorders of the eye. Is the patient up to date with their annual eye exam?  Yes  Who is the provider or what is the name of the office in which the patient attends annual eye exams? [facility]  If pt is not established with a provider, would they like to be referred to a provider to establish care? No .   Dental Screening: Recommended annual dental exams  for proper oral hygiene  Community Resource Referral / Chronic Care Management: CRR required this visit?  No   CCM required this visit?  No      Plan:     I have personally reviewed and noted the following in the patient's chart:   Medical and social history Use of alcohol, tobacco or illicit drugs  Current medications and supplements including opioid prescriptions. Patient is not currently taking opioid prescriptions. Functional ability and status Nutritional status Physical activity Advanced directives List of other physicians Hospitalizations, surgeries, and ER visits in previous 12 months Vitals Screenings to include cognitive, depression, and falls Referrals and appointments  In addition, I have reviewed and discussed with patient certain preventive protocols, quality metrics, and best practice recommendations. A written personalized care plan for preventive services as well as general preventive health recommendations were provided to patient.   Tonya Green , Thank you for taking time to come for your Medicare Wellness Visit. I appreciate your ongoing commitment to your health goals. Please review the following plan we discussed and let me know if I can assist you in the future.   These are the goals we discussed:  Goals      Maintain Healthy Lifestyle     Stay active walking as  tolerated Healthy diet        This is a list of the screening recommended for you and due dates:  Health Maintenance  Topic Date Due   Pneumonia Vaccine (1 of 1 - PCV) Never done   COVID-19 Vaccine (8 - 2023-24 season) 05/02/2022   Flu Shot  12/27/2022   Mammogram  07/17/2023   Medicare Annual Wellness Visit  10/17/2023   DTaP/Tdap/Td vaccine (2 - Td or Tdap) 06/28/2025   Colon Cancer Screening  01/04/2032   DEXA scan (bone density measurement)  Completed   Hepatitis C Screening: USPSTF Recommendation to screen - Ages 20-79 yo.  Completed   Zoster (Shingles) Vaccine  Completed   HPV  Vaccine  Aged Out    .  Lonna Cobb, [facility]   10/17/2022   Nurse Notes: Approximately 30 minute Non-Face -To-Face Medicare Wellness Visit

## 2022-10-18 ENCOUNTER — Ambulatory Visit: Payer: Medicare Other | Admitting: Family Medicine

## 2022-10-20 ENCOUNTER — Emergency Department
Admission: EM | Admit: 2022-10-20 | Discharge: 2022-10-20 | Disposition: A | Discharge status: Home / Self Care | Payer: Medicare Other | Attending: Emergency Medicine | Admitting: Emergency Medicine

## 2022-10-20 ENCOUNTER — Emergency Department: Payer: Medicare Other

## 2022-10-20 ENCOUNTER — Other Ambulatory Visit: Payer: Self-pay

## 2022-10-20 ENCOUNTER — Encounter: Payer: Self-pay | Admitting: Emergency Medicine

## 2022-10-20 DIAGNOSIS — Z85828 Personal history of other malignant neoplasm of skin: Secondary | ICD-10-CM | POA: Diagnosis not present

## 2022-10-20 DIAGNOSIS — I10 Essential (primary) hypertension: Secondary | ICD-10-CM | POA: Insufficient documentation

## 2022-10-20 DIAGNOSIS — R079 Chest pain, unspecified: Secondary | ICD-10-CM | POA: Insufficient documentation

## 2022-10-20 DIAGNOSIS — E039 Hypothyroidism, unspecified: Secondary | ICD-10-CM | POA: Insufficient documentation

## 2022-10-20 LAB — BASIC METABOLIC PANEL
Anion gap: 8 (ref 5–15)
BUN: 16 mg/dL (ref 8–23)
CO2: 25 mmol/L (ref 22–32)
Calcium: 8.8 mg/dL — ABNORMAL LOW (ref 8.9–10.3)
Chloride: 105 mmol/L (ref 98–111)
Creatinine, Ser: 0.8 mg/dL (ref 0.44–1.00)
GFR, Estimated: >60 mL/min (ref >60)
Glucose, Bld: 105 mg/dL — ABNORMAL HIGH (ref 70–99)
Potassium: 3.8 mmol/L (ref 3.5–5.1)
Sodium: 138 mmol/L (ref 135–145)

## 2022-10-20 LAB — CBC
HCT: 40.6 % (ref 36.0–46.0)
Hemoglobin: 13 g/dL (ref 12.0–15.0)
MCH: 30.2 pg (ref 26.0–34.0)
MCHC: 32 g/dL (ref 30.0–36.0)
MCV: 94.2 fL (ref 80.0–100.0)
Platelets: 253 10*3/uL (ref 150–400)
RBC: 4.31 MIL/uL (ref 3.87–5.11)
RDW: 13 % (ref 11.5–15.5)
WBC: 5.3 10*3/uL (ref 4.0–10.5)
nRBC: 0 % (ref 0.0–0.2)

## 2022-10-20 LAB — TROPONIN I (HIGH SENSITIVITY)
Troponin I (High Sensitivity): 4 ng/L (ref <18)
Troponin I (High Sensitivity): 3 ng/L (ref <18)

## 2022-10-20 LAB — D-DIMER, QUANTITATIVE: D-Dimer, Quant: 0.87 ug/mL-FEU — ABNORMAL HIGH (ref 0.00–0.50)

## 2022-10-20 MED ORDER — ASPIRIN 81 MG PO CHEW
324.0000 mg | CHEWABLE_TABLET | Freq: Once | ORAL | Status: AC
  Administered 2022-10-20: 324 mg via ORAL
  Filled 2022-10-20: qty 4

## 2022-10-20 MED ORDER — ACETAMINOPHEN 500 MG PO TABS
1000.0000 mg | ORAL_TABLET | Freq: Once | ORAL | Status: AC
  Administered 2022-10-20: 1000 mg via ORAL
  Filled 2022-10-20: qty 2

## 2022-10-20 MED ORDER — IOHEXOL 350 MG/ML SOLN
75.0000 mL | Freq: Once | INTRAVENOUS | Status: AC | PRN
  Administered 2022-10-20: 75 mL via INTRAVENOUS

## 2022-10-20 NOTE — ED Triage Notes (Signed)
Pt woke this morning with chest pain that radiates to both shoulders and back. Pt states yesterday felt ike heart was beating irregular. Has upset stomach with no vomiting.

## 2022-10-20 NOTE — Discharge Instructions (Addendum)
Your blood work, EKG and CAT scan of your chest were all reassuring.  If your pain is changing or intensifying then please return to the emergency department.  Please follow-up with cardiology.  If you do not hear from the cardiology office please call the number above to schedule an appointment.

## 2022-10-20 NOTE — ED Provider Notes (Signed)
[facility] Provider Note    None    (approximate)   History   Chest Pain   HPI  Tonya Green is a 72 y.o. female with past medical history of hypertension, hyperlipidemia, remote history of DVT with factor V Leiden deficiency, who presents with chest pain.  Yesterday afternoon patient felt like her heart was beating irregularly.  She had some dyspnea as well but did not have any associated chest pain.  This was just feeling generally unwell when she went to bed.  Then woke up in the middle the night and her neuropathic pain that frequently affects her right leg was acting up so she took A Vicodin.  At that time she noticed that her chest was tight she has never felt before.  The Vicodin made her leg pain better but did not affect her chest pain.  She got up again during the night and noticed the chest pain was still there.  This persisted this morning.  It is a mild tightness and she has some associated pain in bilateral shoulders and neck but pain does not radiate straight through to the back.  She does have some associated dyspnea.  Pain is nonpleuritic and nonexertional.  Denies any pain or swelling in her legs.  She has a chronic cough but nothing new.  Nonproductive.  No fevers or chills.  Did have some mild nausea but no vomiting no diaphoresis.  Has no history of cardiac disease.  Does have a remote history of DVT.  Does not take any blood thinners currently     Past Medical History:  Diagnosis Date   Basal cell carcinoma    left nasal sidewall [facility]/11/19 [facility]   Carotid artery stenosis    mild L>R 03/26/16 [place]    Cataract of left eye 05/25/2021   Chronic cough 04/03/2022   Diverticulosis    colonoscopy 10/13/16 [place]   Factor 5 Leiden mutation, heterozygous (HCC)    Fatty liver    Heterozygous factor V Leiden mutation (HCC) 11/01/2017   History of blood transfusion    birth Rh factor    History of skin cancer  11/01/2017   H/o BCC forehead and SCC nose    Hyperlipidemia    Hypertension    Hyponatremia 12/18/2017   Leukocytosis 12/18/2017   OSA on CPAP    Radiculopathy    Right leg DVT (HCC)    2010 s/p back surgery was on Coumadin x 6 months    Thyroid disease    hypothyroidism    UTI (urinary tract infection) 08/21/2018    Patient Active Problem List   Diagnosis Date Noted   Dermatitis 10/14/2022   Post-nasal drip 10/14/2022   Ischemic heart disease screen 10/14/2022   Vitamin D deficiency 05/12/2022   Prediabetes 05/12/2022   Chronic cough 04/03/2022   Other chronic pain 11/23/2020   Neuropathy 05/24/2020   Anxiety 09/22/2019   Fatty liver 09/22/2019   Chronic low back pain with sciatica 06/17/2018   Lumbar radiculopathy 12/18/2017   Muscle spasm 12/18/2017   OSA on CPAP 11/01/2017   Carotid artery disease (HCC) 11/01/2017   Essential hypertension 11/01/2017   Hyperlipidemia 11/01/2017   Hypothyroidism 11/01/2017   Osteopenia 11/01/2017   Osteoporosis 11/01/2017     Physical Exam  Triage Vital Signs: ED Triage Vitals [10/20/22 0735]  Enc Vitals Group     BP      Pulse      Resp  Temp      Temp src      SpO2      Weight 178 lb 9.2 oz (81 kg)     Height 5\' 5"  (1.651 m)     Head Circumference      Peak Flow      Pain Score 7     Pain Loc      Pain Edu?      Excl. in GC?     Most recent vital signs: Vitals:   10/20/22 0744  BP: (!) 158/85  Pulse: 96  Resp: 16  Temp: 99.8 F (37.7 C)  SpO2: 95%     General: Awake, no distress.  CV:  Good peripheral perfusion.  Resp:  Normal effort.  Abd:  No distention.  Neuro:             Awake, Alert, Oriented x 3  Other:     ED Results / Procedures / Treatments  Labs (all labs ordered are listed, but only abnormal results are displayed) Labs Reviewed  BASIC METABOLIC PANEL - Abnormal; Notable for the following components:      Result Value   Glucose, Bld 105 (*)    Calcium 8.8 (*)    All other  components within normal limits  D-DIMER, QUANTITATIVE - Abnormal; Notable for the following components:   D-Dimer, Quant 0.87 (*)    All other components within normal limits  CBC  TROPONIN I (HIGH SENSITIVITY)  TROPONIN I (HIGH SENSITIVITY)     EKG  I reviewed and interpreted the EKG which shows sinus rhythm normal axis and intervals subtle ST depression in lead II and V6 but no contiguous leads   RADIOLOGY I reviewed and interpreted the CXR which does not show any acute cardiopulmonary process    PROCEDURES:  Critical Care performed: No  .1-3 Lead EKG Interpretation  Performed by: Georga Hacking, MD Authorized by: Georga Hacking, MD     Interpretation: normal     ECG rate assessment: normal     Rhythm: sinus rhythm     Ectopy: none     Conduction: normal     The patient is on the cardiac monitor to evaluate for evidence of arrhythmia and/or significant heart rate changes.   MEDICATIONS ORDERED IN ED: Medications  aspirin chewable tablet 324 mg (324 mg Oral Given 10/20/22 0819)  acetaminophen (TYLENOL) tablet 1,000 mg (1,000 mg Oral Given 10/20/22 0819)  iohexol (OMNIPAQUE) 350 MG/ML injection 75 mL (75 mLs Intravenous Contrast Given 10/20/22 0919)     IMPRESSION / MDM / ASSESSMENT AND PLAN / ED COURSE  I reviewed the triage vital signs and the nursing notes.                              Patient's presentation is most consistent with acute presentation with potential threat to life or bodily function.  Differential diagnosis includes, but is not limited to, acute coronary syndrome including unstable angina, NSTEMI, pulmonary embolism, aortic dissection, musculoskeletal pain, reflux  The patient is a 72 year old female who presents because of chest pain.  Yesterday she was feeling some irregularity in her heart rate and had some dyspnea but no pain.  Chest pain started overnight.  Describes as a tight feeling with some associated tightness in bilateral  shoulders and posterior neck but no pain rating to her back.  It is not tearing or ripping.  She does have some associated dyspnea  no diaphoresis or vomiting just mild nausea and upset stomach.  She has never had similar symptoms before.  She is mildly hypertensive on arrival saturating well with normal heart rate.  Temp 99.8.  Initial EKG does not have any obvious ischemic changes and appears similar to prior EKG.  Patient looks well on exam.  Differential is as above.  I have low suspicion for aortic dissection based on how well the patient appears clinically and the relatively mild pain.  With a history of dyspnea and palpitations yesterday as well as her history of factor V Leiden I do think we need investigate for PE.  She is relatively low risk by Anner [place]te will check D-dimer.  Will get serial troponins x-ray.  Will give aspirin.  D-dimer is elevated at 0.87.  Will obtain CT angio.   CT angio is negative for pulmonary embolism.  There are several nodules but radiology does not recommend any follow-up with patient has no high risk features which she does not.  Send patient's pain is significantly improved and she is essentially pain-free at this time.  Will repeat troponin.  Patient's repeat troponin is negative.  At this point with nonischemic EKG atypical symptoms and 2 negative troponins with her being pain-free at this time I do think that she can be discharged and follow-up with cardiology as an outpatient.  We discussed reasons to return to the emergency department mainly being increasing or changing pain.  I have placed an order for expedited cardiology follow-up.   FINAL CLINICAL IMPRESSION(S) / ED DIAGNOSES   Final diagnoses:  Chest pain, unspecified type     Rx / DC Orders   ED Discharge Orders     None        Note:  This document was prepared using Dragon voice recognition software and may include unintentional dictation errors.   Georga Hacking, MD 10/20/22  [phone]

## 2022-10-24 ENCOUNTER — Telehealth: Payer: Self-pay | Admitting: *Deleted

## 2022-10-24 NOTE — Transitions of Care (Post Inpatient/ED Visit) (Signed)
   10/24/2022  Name: Tonya Green MRN: [id number] DOB: 1950-11-24  Today's TOC FU Call Status: Today's TOC FU Call Status:: Unsuccessul Call (1st Attempt) Unsuccessful Call (1st Attempt) Date: 10/24/22  Attempted to reach the patient regarding the most recent Inpatient/ED visit.  Follow Up Plan: Additional outreach attempts will be made to reach the patient to complete the Transitions of Care (Post Inpatient/ED visit) call.   Gean Maidens BSN RN [facility] [phone]

## 2022-10-24 NOTE — Telephone Encounter (Signed)
Tonya Green  P [facility] Clinical Pool (supporting Oretha Milch, MD)Yesterday (8:50 AM)    More research. My husband has figured out to get his eye meds at a reasonable price from a [place] pharmacy. I can get Qvar from that same pharmacy.  Same as for my husband's medication I need a paper handwritten prescription with the physician's original signature on the paper to get the medication. Therefore, if Dr. Vassie Loll is willing to write and sign a prescription for Qvar, I'll get it picked at your office and mail it to [place]. The price is $85.00 as compared to well over $300.00. My husband's meds are exactly what his ophthalmologist ordered. I expect nothing less.  Should you have any questions don't hesitate to call me [phone].  Thank you!  Tonya Green     Routing to [employer] and [place] since Dr. Vassie Loll is out in [place] today.

## 2022-10-29 ENCOUNTER — Encounter: Payer: Self-pay | Admitting: Cardiology

## 2022-10-29 ENCOUNTER — Ambulatory Visit: Payer: Medicare Other | Attending: Cardiology | Admitting: Cardiology

## 2022-10-29 VITALS — BP 146/80 | HR 76 | Ht 65.0 in | Wt 179.4 lb

## 2022-10-29 DIAGNOSIS — R072 Precordial pain: Secondary | ICD-10-CM | POA: Diagnosis not present

## 2022-10-29 DIAGNOSIS — I10 Essential (primary) hypertension: Secondary | ICD-10-CM

## 2022-10-29 DIAGNOSIS — R079 Chest pain, unspecified: Secondary | ICD-10-CM

## 2022-10-29 DIAGNOSIS — E7800 Pure hypercholesterolemia, unspecified: Secondary | ICD-10-CM | POA: Insufficient documentation

## 2022-10-29 DIAGNOSIS — D682 Hereditary deficiency of other clotting factors: Secondary | ICD-10-CM | POA: Diagnosis not present

## 2022-10-29 MED ORDER — METOPROLOL TARTRATE 100 MG PO TABS: 100.0000 mg | ORAL_TABLET | Freq: Once | ORAL | 0 refills | Status: DC

## 2022-10-29 MED ORDER — ATORVASTATIN CALCIUM 20 MG PO TABS: 20.0000 mg | ORAL_TABLET | Freq: Every day | ORAL | 3 refills | Status: DC

## 2022-10-29 NOTE — Patient Instructions (Signed)
Medication Instructions:   START Atorvastatin - Take one tablet ( 20mg ) by mouth daily.   *If you need a refill on your cardiac medications before your next appointment, please call your pharmacy*   Lab Work:  None Ordered  If you have labs (blood work) drawn today and your tests are completely normal, you will receive your results only by: MyChart Message (if you have MyChart) OR A paper copy in the mail If you have any lab test that is abnormal or we need to change your treatment, we will call you to review the results.   Testing/Procedures:  Your physician has requested that you have an echocardiogram. Echocardiography is a painless test that uses sound waves to create images of your heart. It provides your doctor with information about the size and shape of your heart and how well your heart's chambers and valves are working. This procedure takes approximately one hour. There are no restrictions for this procedure. Please do NOT wear cologne, perfume, aftershave, or lotions (deodorant is allowed). Please arrive 15 minutes prior to your appointment time.     Your cardiac CT will be scheduled at:  [facility] [address] [place], [place] [address] [phone]  OR   [facility] [address] [place], [place] [address] [phone]  If scheduled at [facility] or [facility], please arrive 15 mins early for check-in and test prep.   Please follow these instructions carefully (unless otherwise directed):  Hold all erectile dysfunction medications at least 3 days (72 hrs) prior to test. (Ie viagra, cialis, sildenafil, tadalafil, etc) We will administer nitroglycerin during this exam.   On the Night Before the Test: Be sure to Drink plenty of water. Do not consume any caffeinated/decaffeinated beverages or chocolate 12 hours prior to your test. Do not  take any antihistamines 12 hours prior to your test.  On the Day of the Test: Drink plenty of water until 1 hour prior to the test. Do not eat any food 1 hour prior to test. You may take your regular medications prior to the test.  Take metoprolol (Lopressor) two hours prior to test. If you take Furosemide/Hydrochlorothiazide/Spironolactone, please HOLD on the morning of the test. FEMALES- please wear underwire-free bra if available, avoid dresses & tight clothing      After the Test: Drink plenty of water. After receiving IV contrast, you may experience a mild flushed feeling. This is normal. On occasion, you may experience a mild rash up to 24 hours after the test. This is not dangerous. If this occurs, you can take Benadryl 25 mg and increase your fluid intake. If you experience trouble breathing, this can be serious. If it is severe call 911 IMMEDIATELY. If it is mild, please call our office. If you take any of these medications: Glipizide/Metformin, Avandament, Glucavance, please do not take 48 hours after completing test unless otherwise instructed.  We will call to schedule your test 2-4 weeks out understanding that some insurance companies will need an authorization prior to the service being performed.   For non-scheduling related questions, please contact the cardiac imaging nurse navigator should you have any questions/concerns: Rockwell Alexandria, Cardiac Imaging Nurse Navigator Larey Brick, Cardiac Imaging Nurse Navigator [facility] Direct Office Dial: [phone]   For scheduling needs, including cancellations and rescheduling, please call [place], [phone].   Follow-Up: At [facility], you and your health needs are  our priority.  As part of our continuing mission to provide you with exceptional heart care, we have created designated Provider Care Teams.  These Care Teams include your primary Cardiologist (physician) and Advanced  Practice Providers (APPs -  Physician Assistants and Nurse Practitioners) who all work together to provide you with the care you need, when you need it.  We recommend signing up for the patient portal called "MyChart".  Sign up information is provided on this After Visit Summary.  MyChart is used to connect with patients for Virtual Visits (Telemedicine).  Patients are able to view lab/test results, encounter notes, upcoming appointments, etc.  Non-urgent messages can be sent to your provider as well.   To learn more about what you can do with MyChart, go to [email or web address].    Your next appointment:    After testing  Provider:   You may see Debbe Odea, MD or one of the following Advanced Practice Providers on your designated Care Team:   Nicolasa Ducking, NP Eula Listen, PA-C Cadence Fransico Michael, PA-C Charlsie Quest, NP

## 2022-10-29 NOTE — Progress Notes (Signed)
Cardiology Office Note:    Date:  10/29/2022   ID:  Tonya Green, DOB 10-30-1950, MRN [id number]  PCP:  Dana Allan, MD   [facility] Providers Cardiologist:  Debbe Odea, MD     Referring MD: Dana Allan, MD   Chief Complaint  Patient presents with   New Patient (Initial Visit)    Referred by ED for chest pain.  Has been seen by cardiologist while living in [place] in 2014 and she brings in paper records      History of Present Illness:    Tonya Green is a 72 y.o. female with a hx of hypertension, hyperlipidemia, DVT, factor V Leyden deficiency, neuropathy, hypothyroidism who presents due to chest pain.  States having symptoms of chest pain about a week ago during [employer] Day weekend.  Symptoms were not associated with exertion, Evaluated in the ED, workup with EKG and troponins were unrevealing.  CT angio was negative for PE.  She has a history of DVT, factor V Leyden deficiency, daughter has history of PE. has never seen a hematologist.  Has had occasional chest pressure with exertion since then.  He has high cholesterol, also has a history of neuropathy, afraid of having flareups which is why she does not take any statin.  Has never tried any statin.  Father had a heart attack in his 56s, father also had high cholesterol.  Outside echo 2014 EF 55 to 60%  Past Medical History:  Diagnosis Date   Basal cell carcinoma    left nasal sidewall [facility]/11/[facility] Dr[facility] Jeannine B[facility]   Carotid artery stenosis    mild L>R 03/26/16 [place]    Cataract of left eye 05/25/2021   Chronic cough 04/03/2022   Diverticulosis    colonoscopy 10/13/16 [place]   Factor 5 Leiden mutation, heterozygous (HCC)    Fatty liver    Heterozygous factor V Leiden mutation (HCC) 11/01/2017   History of blood transfusion    birth Rh factor    History of skin cancer 11/01/2017   H/o BCC forehead and SCC nose    Hyperlipidemia    Hypertension    Hyponatremia 12/18/2017   Leukocytosis  12/18/2017   OSA on CPAP    Radiculopathy    Right leg DVT (HCC)    2010 s/p back surgery was on Coumadin x 6 months    Thyroid disease    hypothyroidism    UTI (urinary tract infection) 08/21/2018    Past Surgical History:  Procedure Laterality Date   APPENDECTOMY     2000   BACK SURGERY     BREAST EXCISIONAL BIOPSY Left 2005   for infection   BREAST SURGERY     biospy ? year    COLONOSCOPY     COLONOSCOPY WITH PROPOFOL N/A 01/01/2022   Procedure: COLONOSCOPY WITH PROPOFOL;  Surgeon: Regis Bill, MD;  Location: [facility] ENDOSCOPY;  Service: Endoscopy;  Laterality: N/A;   MOHS SURGERY  05/11/2019   left nasal side wall bcc Dr. Jeannine Boga in [place]    ruptured disc repair     L4/5 10/2008 with complications residual right leg weakness/reduced sensation and DVT in 2010 post surgery     Current Medications: Current Meds  Medication Sig   amLODipine (NORVASC) 2.5 MG tablet Take 1 tablet (2.5 mg total) by mouth daily. In am if BP>140/?90 take another tablet   Ascorbic Acid (VITAMIN C) 1000 MG tablet Take 1,000 mg by mouth 3 (three) times daily.   atorvastatin (LIPITOR)  20 MG tablet Take 1 tablet (20 mg total) by mouth daily.   Cholecalciferol (VITAMIN D3) 5000 units CAPS Take by mouth.   diazepam (VALIUM) 2 MG tablet TAKE 1/2 TO 1 TABLET BY MOUTH EVERY NIGHT AT BEDTIME AS NEEDED   gabapentin (NEURONTIN) 100 MG capsule TAKE 3 CAPSULES(300 MG) BY MOUTH THREE TIMES DAILY   HYDROcodone-acetaminophen (NORCO/VICODIN) 5-325 MG tablet Take 1 tablet by mouth 2 (two) times daily as needed (only for flare ups).   levothyroxine (SYNTHROID) 50 MCG tablet Take 1 tablet (50 mcg total) by mouth daily before breakfast.   MAGNESIUM MALATE PO Take 625 mg by mouth.   metoprolol tartrate (LOPRESSOR) 100 MG tablet Take 1 tablet (100 mg total) by mouth once for 1 dose. TWO HOURS PRIOR TO CARDIAC CTA   Nattokinase 100 MG CAPS Take 100 mg by mouth daily.   Strontium Chloride POWD Use as directed 500 mg in the  mouth or throat.      Allergies:   Macrobid [nitrofurantoin macrocrystal] and Sulfa antibiotics   Social History   Socioeconomic History   Marital status: Married    Spouse name: Not on file   Number of children: 2   Years of education: Not on file   Highest education level: Not on file  Occupational History   Occupation: Retired  Tobacco Use   Smoking status: Never   Smokeless tobacco: Never  Vaping Use   Vaping Use: Never used  Substance and Sexual Activity   Alcohol use: Yes    Comment: occ   Drug use: Never   Sexual activity: Not on file  Other Topics Concern   Not on file  Social History Narrative   BSN CCM, RN   Moved from [place] recently    2 daughters    Retired and married    No guns, wears seat belt, safe in relationship    Lives at [employer]    Social Determinants of Health   Financial Resource Strain: Low Risk  (10/17/2022)   Overall Financial Resource Strain (CARDIA)    Difficulty of Paying Living Expenses: Not hard at all  Food Insecurity: No Food Insecurity (10/17/2022)   Hunger Vital Sign    Worried About Running Out of Food in the Last Year: Never true    Ran Out of Food in the Last Year: Never true  Transportation Needs: No Transportation Needs (10/17/2022)   PRAPARE - Administrator, Civil Service (Medical): No    Lack of Transportation (Non-Medical): No  Physical Activity: Sufficiently Active (10/17/2022)   Exercise Vital Sign    Days of Exercise per Week: 5 days    Minutes of Exercise per Session: 30 min  Stress: No Stress Concern Present (10/17/2022)   [employer] of Occupational Health - Occupational Stress Questionnaire    Feeling of Stress : Not at all  Social Connections: Moderately Integrated (10/17/2022)   Social Connection and Isolation Panel [NHANES]    Frequency of Communication with Friends and Family: More than three times a week    Frequency of Social Gatherings with Friends and Family: More than three times a  week    Attends Religious Services: Never    Database administrator or Organizations: Yes    Attends Engineer, structural: More than 4 times per year    Marital Status: Married     Family History: The patient's family history includes Breast cancer in her paternal aunt; Cancer in her brother; Heart disease in  her father; Hyperlipidemia in an other family member; Hypertension in her mother; Other in her daughter; Stroke in her father, mother, and paternal grandfather.  ROS:   Please see the history of present illness.     All other systems reviewed and are negative.  EKGs/Labs/Other Studies Reviewed:    The following studies were reviewed today:   EKG:  EKG is  ordered today.  The ekg ordered today demonstrates normal sinus rhythm, normal ECG.  Recent Labs: 11/24/2021: TSH 2.54 09/24/2022: ALT 31 10/20/2022: BUN 16; Creatinine, Ser 0.80; Hemoglobin 13.0; Platelets 253; Potassium 3.8; Sodium 138  Recent Lipid Panel    Component Value Date/Time   CHOL 303 (H) 09/24/2022 0748   TRIG 150.0 (H) 09/24/2022 0748   HDL 51.50 09/24/2022 0748   CHOLHDL 6 09/24/2022 0748   VLDL 30.0 09/24/2022 0748   LDLCALC 221 (H) 09/24/2022 0748     Risk Assessment/Calculations:    HYPERTENSION CONTROL Vitals:   10/29/22 1449 10/29/22 1458  BP: (!) 156/82 (!) 146/80    The patient's blood pressure is elevated above target today.  In order to address the patient's elevated BP: Blood pressure will be monitored at home to determine if medication changes need to be made.            Physical Exam:    VS:  BP (!) 146/80 (BP Location: Right Arm, Patient Position: Sitting, Cuff Size: Large)   Pulse 76   Ht 5\' 5"  (1.651 m)   Wt 179 lb 6.4 oz (81.4 kg)   SpO2 97%   BMI 29.85 kg/m     Wt Readings from Last 3 Encounters:  10/29/22 179 lb 6.4 oz (81.4 kg)  10/20/22 178 lb 9.2 oz (81 kg)  10/01/22 180 lb (81.6 kg)     GEN:  Well nourished, well developed in no acute  distress HEENT: Normal CARDIAC: RRR, no murmurs, rubs, gallops RESPIRATORY:  Clear to auscultation without rales, wheezing or rhonchi  ABDOMEN: Soft, non-tender, non-distended MUSCULOSKELETAL:  No edema; No deformity  SKIN: Warm and dry NEUROLOGIC:  Alert and oriented x 3 PSYCHIATRIC:  Normal affect   ASSESSMENT:    1. Precordial pain   2. Pure hypercholesterolemia   3. Primary hypertension   4. Factor V deficiency (HCC)   5. Chest pain, unspecified type    PLAN:    In order of problems listed above:  Chest pain, consistent with angina, risk factors hyperlipidemia, hypertension, family history of early CAD.  Obtain echo, obtain coronary CTA. Hyperlipidemia, LDL 221.  Start Lipitor 20 mg daily, if not tolerant, will consider PCSK9. Hypertension, BP elevated today, usually controlled likely from anxiety.  Continue Norvasc 2.5 mg daily. History of DVT, factor V Leyden deficiency, refer to hematology for additional input.     Follow-up after echo and coronary CTA  Medication Adjustments/Labs and Tests Ordered: Current medicines are reviewed at length with the patient today.  Concerns regarding medicines are outlined above.  Orders Placed This Encounter  Procedures   CT CORONARY MORPH W/CTA COR W/SCORE W/CA W/CM &/OR WO/CM   Ambulatory referral to Hematology / Oncology   EKG 12-Lead   ECHOCARDIOGRAM COMPLETE   Meds ordered this encounter  Medications   metoprolol tartrate (LOPRESSOR) 100 MG tablet    Sig: Take 1 tablet (100 mg total) by mouth once for 1 dose. TWO HOURS PRIOR TO CARDIAC CTA    Dispense:  1 tablet    Refill:  0   atorvastatin (LIPITOR) 20 MG tablet  Sig: Take 1 tablet (20 mg total) by mouth daily.    Dispense:  90 tablet    Refill:  3    Patient Instructions  Medication Instructions:   START Atorvastatin - Take one tablet ( 20mg ) by mouth daily.   *If you need a refill on your cardiac medications before your next appointment, please call your  pharmacy*   Lab Work:  None Ordered  If you have labs (blood work) drawn today and your tests are completely normal, you will receive your results only by: MyChart Message (if you have MyChart) OR A paper copy in the mail If you have any lab test that is abnormal or we need to change your treatment, we will call you to review the results.   Testing/Procedures:  Your physician has requested that you have an echocardiogram. Echocardiography is a painless test that uses sound waves to create images of your heart. It provides your doctor with information about the size and shape of your heart and how well your heart's chambers and valves are working. This procedure takes approximately one hour. There are no restrictions for this procedure. Please do NOT wear cologne, perfume, aftershave, or lotions (deodorant is allowed). Please arrive 15 minutes prior to your appointment time.     Your cardiac CT will be scheduled at:  [facility] [address] [place], [place] [address] [phone]  OR   [facility] [address] [place], [place] [address] [phone]  If scheduled at [facility] or [facility], please arrive 15 mins early for check-in and test prep.   Please follow these instructions carefully (unless otherwise directed):  Hold all erectile dysfunction medications at least 3 days (72 hrs) prior to test. (Ie viagra, cialis, sildenafil, tadalafil, etc) We will administer nitroglycerin during this exam.   On the Night Before the Test: Be sure to Drink plenty of water. Do not consume any caffeinated/decaffeinated beverages or chocolate 12 hours prior to your test. Do not take any antihistamines 12 hours prior to your test.  On the Day of the Test: Drink plenty of water until 1 hour prior to the test. Do not eat any food 1 hour prior to test. You may  take your regular medications prior to the test.  Take metoprolol (Lopressor) two hours prior to test. If you take Furosemide/Hydrochlorothiazide/Spironolactone, please HOLD on the morning of the test. FEMALES- please wear underwire-free bra if available, avoid dresses & tight clothing      After the Test: Drink plenty of water. After receiving IV contrast, you may experience a mild flushed feeling. This is normal. On occasion, you may experience a mild rash up to 24 hours after the test. This is not dangerous. If this occurs, you can take Benadryl 25 mg and increase your fluid intake. If you experience trouble breathing, this can be serious. If it is severe call 911 IMMEDIATELY. If it is mild, please call our office. If you take any of these medications: Glipizide/Metformin, Avandament, Glucavance, please do not take 48 hours after completing test unless otherwise instructed.  We will call to schedule your test 2-4 weeks out understanding that some insurance companies will need an authorization prior to the service being performed.   For non-scheduling related questions, please contact the cardiac imaging nurse navigator should you have any questions/concerns: Rockwell Alexandria, Cardiac Imaging Nurse Navigator Larey Brick, Cardiac Imaging Nurse Navigator [place] [facility]  Direct Office Dial: [phone]   For scheduling needs, including cancellations and rescheduling, please call [place], [phone].   Follow-Up: At [facility], you and your health needs are our priority.  As part of our continuing mission to provide you with exceptional heart care, we have created designated Provider Care Teams.  These Care Teams include your primary Cardiologist (physician) and Advanced Practice Providers (APPs -  Physician Assistants and Nurse Practitioners) who all work together to provide you with the care you need, when you need it.  We recommend signing up for the  patient portal called "MyChart".  Sign up information is provided on this After Visit Summary.  MyChart is used to connect with patients for Virtual Visits (Telemedicine).  Patients are able to view lab/test results, encounter notes, upcoming appointments, etc.  Non-urgent messages can be sent to your provider as well.   To learn more about what you can do with MyChart, go to [email or web address].    Your next appointment:    After testing  Provider:   You may see Debbe Odea, MD or one of the following Advanced Practice Providers on your designated Care Team:   Nicolasa Ducking, NP Eula Listen, PA-C Cadence Fransico Michael, PA-C Charlsie Quest, NP   Signed, Debbe Odea, MD  10/29/2022 4:00 PM    [facility]

## 2022-10-30 ENCOUNTER — Inpatient Hospital Stay: Payer: Medicare Other

## 2022-10-30 ENCOUNTER — Inpatient Hospital Stay: Payer: Medicare Other | Attending: Oncology | Admitting: Oncology

## 2022-10-30 ENCOUNTER — Encounter: Payer: Self-pay | Admitting: Oncology

## 2022-10-30 VITALS — BP 149/89 | HR 73 | Temp 98.0°F | Resp 18 | Wt 180.1 lb

## 2022-10-30 DIAGNOSIS — D6851 Activated protein C resistance: Secondary | ICD-10-CM | POA: Diagnosis present

## 2022-10-30 DIAGNOSIS — Z7901 Long term (current) use of anticoagulants: Secondary | ICD-10-CM | POA: Insufficient documentation

## 2022-10-30 DIAGNOSIS — Z86718 Personal history of other venous thrombosis and embolism: Secondary | ICD-10-CM | POA: Diagnosis not present

## 2022-10-30 NOTE — Assessment & Plan Note (Signed)
Heterozygous Factor V leiden mutation, with previous provoked DVT x 1. She has been doing very well without chronic anticoagulation.  Recommend routine thromboprophylaxis if any surgical procedure, acute medical illness or  If any plans of air travel or other situation with prolonged sitting, low dose Aspirin 81 mg can be considered.  I discussed about the awareness of signs and symptoms of VTE requiring prompt medical attention

## 2022-10-30 NOTE — Assessment & Plan Note (Addendum)
History of provoked DVT. She is doing well without anticoagulation. Recommendation as above. We discussed that nattokinase is an extract from fermented soybean, which has potent fibrinolytic activity. It has been reported as an alternative for cardiovascular disease prevention. Despite the available encouraging data, further clinical trials are needed to fully examine the prospect of nattokinase as an alternative medication to anticoagulants in preventing venous thrombosis. She has been taking that long-term and so far is doing well.  Okay from hematology as well to continue.

## 2022-10-30 NOTE — Progress Notes (Signed)
Hematology/Oncology Consult note [phone] [phone]        REFERRING PROVIDER: Debbe Odea, MD   CHIEF COMPLAINTS/REASON FOR VISIT:  Evaluation of factor V Leiden mutation, history of DVT   ASSESSMENT & PLAN:   Heterozygous factor V Leiden mutation (HCC) Heterozygous Factor V leiden mutation, with previous provoked DVT x 1. She has been doing very well without chronic anticoagulation.  Recommend routine thromboprophylaxis if any surgical procedure, acute medical illness or  If any plans of air travel or other situation with prolonged sitting, low dose Aspirin 81 mg can be considered.  I discussed about the awareness of signs and symptoms of VTE requiring prompt medical attention      History of DVT (deep vein thrombosis) History of provoked DVT. She is doing well without anticoagulation. Recommendation as above. We discussed that nattokinase is an extract from fermented soybean, which has potent fibrinolytic activity. It has been reported as an alternative for cardiovascular disease prevention. Despite the available encouraging data, further clinical trials are needed to fully examine the prospect of nattokinase as an alternative medication to anticoagulants in preventing venous thrombosis. She has been taking that long-term and so far is doing well.  Okay from hematology as well to continue.   She does not need to follow-up with hematology routinely.  Follow-up as needed. All questions were answered. The patient knows to call the clinic with any problems, questions or concerns.  Rickard Patience, MD, PhD [facility] Hematology Oncology 10/30/2022   HISTORY OF PRESENTING ILLNESS:   Tonya Green is a  72 y.o.  female with PMH listed below was seen in consultation at the request of  Debbe Odea, MD  for evaluation of factor V Leiden mutation, history of DVT.  Patient has recently establish care with cardiology due to chest pain. 10/20/2022, CT  angio chest PE protocol showed no acute pulmonary embolism.  Several right pulmonary nodules measuring up to 5 mm.  Hepatic steatosis.  Aortic atherosclerosis. She was seen by cardiology.  It was noted that patient has a history of DVT and history of factor V Leiden mutation never seen by hematologist.  Patient was referred to me for further evaluation  Patient reports history of right lower extremity DVT, diagnosed in 2010, in the context of back injury and immobilization.  She was treated with heparin and bridged to Coumadin for 6 months.  Since then, she has not had a second episode of DVT.  Her daughter also was diagnosed with PE treated with Eliquis.  She has a family history of stroke as well. In 2017, patient was tested positive for heterozygous factor V Leiden mutation. 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  Per patient, it was felt that this is induced by chronic NSAIDs use.  Currently she is off NSAIDs.   Diarrhea has improved. Denies any unintentional weight loss.   Patient takes Natookinase 2000 units, per patient, this was recommended by a [employer] cardiologist for prevention of CVD.  MEDICAL HISTORY:  Past Medical History:  Diagnosis Date   Basal cell carcinoma    left nasal sidewall [facility]annine [facility]   Carotid artery stenosis    mild L>R 03/26/16 [place]    Cataract of left eye 05/25/2021   Chronic cough 04/03/2022   Diverticulosis    colonoscopy 10/13/16 [place]   Factor 5 Leiden mutation, heterozygous (HCC)    Fatty liver    Heterozygous factor V Leiden mutation (HCC) 11/01/2017  History of blood transfusion    birth Rh factor    History of skin cancer 11/01/2017   H/o BCC forehead and SCC nose    Hyperlipidemia    Hypertension    Hyponatremia 12/18/2017   Leukocytosis 12/18/2017   OSA on CPAP    Radiculopathy    Right leg DVT (HCC)    2010 s/p back surgery was on Coumadin x 6 months    Thyroid disease     hypothyroidism    UTI (urinary tract infection) 08/21/2018    SURGICAL HISTORY: Past Surgical History:  Procedure Laterality Date   APPENDECTOMY     2000   BACK SURGERY     BREAST EXCISIONAL BIOPSY Left 2005   for infection   BREAST SURGERY     biospy ? year    COLONOSCOPY     COLONOSCOPY WITH PROPOFOL N/A 01/01/2022   Procedure: COLONOSCOPY WITH PROPOFOL;  Surgeon: Regis Bill, MD;  Location: [facility] ENDOSCOPY;  Service: Endoscopy;  Laterality: N/A;   MOHS SURGERY  05/11/2019   left nasal side wall bcc Dr. Jeannine Boga in [place]    ruptured disc repair     L4/5 10/2008 with complications residual right leg weakness/reduced sensation and DVT in 2010 post surgery     SOCIAL HISTORY: Social History   Socioeconomic History   Marital status: Married    Spouse name: Not on file   Number of children: 2   Years of education: Not on file   Highest education level: Not on file  Occupational History   Occupation: Retired  Tobacco Use   Smoking status: Never   Smokeless tobacco: Never  Vaping Use   Vaping Use: Never used  Substance and Sexual Activity   Alcohol use: Yes    Comment: occ   Drug use: Yes    Types: Hydrocodone    Comment: PRN for pain flare ups   Sexual activity: Not on file  Other Topics Concern   Not on file  Social History Narrative   BSN CCM, RN   Moved from [place] recently    2 daughters    Retired and married    No guns, wears seat belt, safe in relationship    Lives at [employer]    Social Determinants of Health   Financial Resource Strain: Low Risk  (10/17/2022)   Overall Financial Resource Strain (CARDIA)    Difficulty of Paying Living Expenses: Not hard at all  Food Insecurity: No Food Insecurity (10/30/2022)   Hunger Vital Sign    Worried About Running Out of Food in the Last Year: Never true    Ran Out of Food in the Last Year: Never true  Transportation Needs: No Transportation Needs (10/30/2022)   PRAPARE - Scientist, research (physical sciences) (Medical): No    Lack of Transportation (Non-Medical): No  Physical Activity: Sufficiently Active (10/17/2022)   Exercise Vital Sign    Days of Exercise per Week: 5 days    Minutes of Exercise per Session: 30 min  Stress: No Stress Concern Present (10/17/2022)   [employer] of Occupational Health - Occupational Stress Questionnaire    Feeling of Stress : Not at all  Social Connections: Moderately Integrated (10/17/2022)   Social Connection and Isolation Panel [NHANES]    Frequency of Communication with Friends and Family: More than three times a week    Frequency of Social Gatherings with Friends and Family: More than three times a week    Attends Religious  Services: Never    Active Member of Clubs or Organizations: Yes    Attends Engineer, structural: More than 4 times per year    Marital Status: Married  Catering manager Violence: Not At Risk (10/30/2022)   Humiliation, Afraid, Rape, and Kick questionnaire    Fear of Current or Ex-Partner: No    Emotionally Abused: No    Physically Abused: No    Sexually Abused: No    FAMILY HISTORY: Family History  Problem Relation Age of Onset   Hypertension Mother    Stroke Mother        in 65s    Heart disease Father    Stroke Father    Stroke Brother    Esophageal cancer Brother    Liver cancer Paternal Aunt    Stroke Paternal Grandfather    Other Daughter        chronic back pain nerve sheath abnormal in spine s/p surgery   Hyperlipidemia Other     ALLERGIES:  is allergic to macrobid [nitrofurantoin macrocrystal] and sulfa antibiotics.  MEDICATIONS:  Current Outpatient Medications  Medication Sig Dispense Refill   amLODipine (NORVASC) 2.5 MG tablet Take 1 tablet (2.5 mg total) by mouth daily. In am if BP>140/?90 take another tablet 180 tablet 3   Ascorbic Acid (VITAMIN C) 1000 MG tablet Take 1,000 mg by mouth 3 (three) times daily.     atorvastatin (LIPITOR) 20 MG tablet Take 1 tablet (20 mg total) by  mouth daily. 90 tablet 3   Cholecalciferol (VITAMIN D3) 5000 units CAPS Take by mouth.     diazepam (VALIUM) 2 MG tablet TAKE 1/2 TO 1 TABLET BY MOUTH EVERY NIGHT AT BEDTIME AS NEEDED 30 tablet 0   gabapentin (NEURONTIN) 100 MG capsule TAKE 3 CAPSULES(300 MG) BY MOUTH THREE TIMES DAILY 560 capsule 11   HYDROcodone-acetaminophen (NORCO/VICODIN) 5-325 MG tablet Take 1 tablet by mouth 2 (two) times daily as needed (only for flare ups). 30 tablet 0   levothyroxine (SYNTHROID) 50 MCG tablet Take 1 tablet (50 mcg total) by mouth daily before breakfast. 90 tablet 3   MAGNESIUM MALATE PO Take 625 mg by mouth.     Nattokinase 100 MG CAPS Take 100 mg by mouth daily.     Strontium Chloride POWD Use as directed 500 mg in the mouth or throat.      fexofenadine (ALLEGRA ALLERGY) 180 MG tablet Take 1 tablet (180 mg total) by mouth daily. (Patient not taking: Reported on 10/29/2022) 90 tablet 3   fluticasone (FLONASE) 50 MCG/ACT nasal spray Place 2 sprays into both nostrils daily. (Patient not taking: Reported on 10/29/2022) 16 g 6   hydrocortisone 2.5 % ointment Apply topically 2 (two) times daily. Apply to affected area two times a day as needed for 5 days (Patient not taking: Reported on 10/29/2022) 30 g 0   metoprolol tartrate (LOPRESSOR) 100 MG tablet Take 1 tablet (100 mg total) by mouth once for 1 dose. TWO HOURS PRIOR TO CARDIAC CTA (Patient not taking: Reported on 10/30/2022) 1 tablet 0   No current facility-administered medications for this visit.    Review of Systems  Constitutional:  Negative for appetite change, chills, fatigue and fever.  HENT:   Negative for hearing loss and voice change.   Eyes:  Negative for eye problems.  Respiratory:  Negative for chest tightness and cough.   Cardiovascular:  Negative for chest pain.  Gastrointestinal:  Negative for abdominal distention, abdominal pain and blood in stool.  Endocrine:  Negative for hot flashes.  Genitourinary:  Negative for difficulty urinating and  frequency.   Musculoskeletal:  Negative for arthralgias.       Chronic intermittent right lower extremity electric-like pain.  Skin:  Negative for itching and rash.  Neurological:  Negative for extremity weakness.  Hematological:  Negative for adenopathy.  Psychiatric/Behavioral:  Negative for confusion.    PHYSICAL EXAMINATION:  Vitals:   10/30/22 1114  BP: (!) 149/89  Pulse: 73  Resp: 18  Temp: 98 F (36.7 C)   Filed Weights   10/30/22 1114  Weight: 180 lb 1.6 oz (81.7 kg)    Physical Exam Constitutional:      General: She is not in acute distress.    Appearance: She is obese.  HENT:     Head: Normocephalic and atraumatic.  Eyes:     General: No scleral icterus. Cardiovascular:     Rate and Rhythm: Normal rate and regular rhythm.     Heart sounds: Normal heart sounds.  Pulmonary:     Effort: Pulmonary effort is normal. No respiratory distress.     Breath sounds: No wheezing.  Abdominal:     General: Bowel sounds are normal. There is no distension.     Palpations: Abdomen is soft.  Musculoskeletal:        General: No deformity. Normal range of motion.     Cervical back: Normal range of motion and neck supple.  Skin:    General: Skin is warm and dry.     Findings: No erythema or rash.  Neurological:     Mental Status: She is alert and oriented to person, place, and time. Mental status is at baseline.     Cranial Nerves: No cranial nerve deficit.  Psychiatric:        Mood and Affect: Mood normal.     LABORATORY DATA:  I have reviewed the data as listed    Latest Ref Rng & Units 10/20/2022    8:12 AM 09/24/2022    7:48 AM 11/24/2021    7:31 AM  CBC  WBC 4.0 - 10.5 K/uL 5.3  6.0  6.1   Hemoglobin 12.0 - 15.0 g/dL 16.1  09.6  04.5   Hematocrit 36.0 - 46.0 % 40.6  40.0  38.3   Platelets 150 - 400 K/uL 253  281.0  277.0       Latest Ref Rng & Units 10/20/2022    8:12 AM 09/24/2022    7:48 AM 11/24/2021    7:31 AM  CMP  Glucose 70 - 99 mg/dL 409  811  914    BUN 8 - 23 mg/dL 16  13  17    Creatinine 0.44 - 1.00 mg/dL 7.82  9.56  2.13   Sodium 135 - 145 mmol/L 138  137  138   Potassium 3.5 - 5.1 mmol/L 3.8  4.0  3.9   Chloride 98 - 111 mmol/L 105  102  105   CO2 22 - 32 mmol/L 25  26  26    Calcium 8.9 - 10.3 mg/dL 8.8  9.3  9.4   Total Protein 6.0 - 8.3 g/dL  7.5  7.5   Total Bilirubin 0.2 - 1.2 mg/dL  0.5  0.6   Alkaline Phos 39 - 117 U/L  84  76   AST 0 - 37 U/L  25  17   ALT 0 - 35 U/L  31  22       RADIOGRAPHIC STUDIES: I have personally reviewed the radiological  images as listed and agreed with the findings in the report. CT Angio Chest PE W and/or Wo Contrast  Result Date: 10/20/2022 CLINICAL DATA:  Chest pain radiates to the shoulders and back. EXAM: CT ANGIOGRAPHY CHEST WITH CONTRAST TECHNIQUE: Multidetector CT imaging of the chest was performed using the standard protocol during bolus administration of intravenous contrast. Multiplanar CT image reconstructions and MIPs were obtained to evaluate the vascular anatomy. RADIATION DOSE REDUCTION: This exam was performed according to the departmental dose-optimization program which includes automated exposure control, adjustment of the mA and/or kV according to patient size and/or use of iterative reconstruction technique. CONTRAST:  75mL OMNIPAQUE IOHEXOL 350 MG/ML SOLN COMPARISON:  None Available. FINDINGS: Cardiovascular: The heart size is normal. No substantial pericardial effusion. Coronary artery calcification is evident. Mild atherosclerotic calcification is noted in the wall of the thoracic aorta. There is no filling defect within the opacified pulmonary arteries to suggest the presence of an acute pulmonary embolus. Mediastinum/Nodes: No mediastinal lymphadenopathy. There is no hilar lymphadenopathy. The esophagus has normal imaging features. There is no axillary lymphadenopathy. Lungs/Pleura: 4 mm anterior right upper lobe nodule on 79/10 with adjacent 5 mm nodule on 78/10. 4 mm right  lower lobe nodule identified on 77/10. Atelectasis or scarring noted in the posterior lingula and both dependent lung bases. No overtly suspicious nodule or mass. No focal airspace consolidation. No pleural effusion. Upper Abdomen: The liver shows diffusely decreased attenuation suggesting fat deposition. Musculoskeletal: No worrisome lytic or sclerotic osseous abnormality. Review of the MIP images confirms the above findings. IMPRESSION: 1. No CT evidence for acute pulmonary embolus. 2. Several right pulmonary nodules measuring up to 5 mm. No follow-up needed if patient is low-risk (and has no known or suspected primary neoplasm). Non-contrast chest CT can be considered in 12 months if patient is high-risk. This recommendation follows the consensus statement: Guidelines for Management of Incidental Pulmonary Nodules Detected on CT Images: From the Fleischner Society 2017; Radiology 2017; 284:228-243. 3. Hepatic steatosis. 4.  Aortic Atherosclerosis (ICD10-I70.0). Electronically Signed   By: Kennith Center M.D.   On: 10/20/2022 09:52   DG Chest 2 View  Result Date: 10/20/2022 CLINICAL DATA:  Chest pain EXAM: CHEST - 2 VIEW COMPARISON:  04/03/2022 FINDINGS: The lungs are clear without focal pneumonia, edema, pneumothorax or pleural effusion. Similar atelectasis or scarring at the left base. The cardiopericardial silhouette is within normal limits for size. No acute bony abnormality. Telemetry leads overlie the chest. IMPRESSION: No active cardiopulmonary disease. Electronically Signed   By: Kennith Center M.D.   On: 10/20/2022 08:27

## 2022-11-08 ENCOUNTER — Ambulatory Visit: Admission: RE | Admit: 2022-11-08 | Payer: Medicare Other | Source: Ambulatory Visit

## 2022-11-11 NOTE — Patient Instructions (Signed)
It was a pleasure meeting you today. Thank you for allowing me to take part in your health care.  Our goals for today as we discussed include:  Prescription for Qvar provided  [facility] to treat acute flare up of pain despite chronic pain management with PCP.  Repeat blood pressure good  Can trial Pepcid 20 mg two times a day for cough and intermittent chest pain.  Follow up with Cardiology and Pulmonology as scheduled  Recommend Pneumonia 20 vaccine  Follow up in 6 months   If you have any questions or concerns, please do not hesitate to call the office at [phone].  I look forward to our next visit and until then take care and stay safe.  Regards,   Dana Allan, MD   [facility]

## 2022-11-11 NOTE — Progress Notes (Signed)
SUBJECTIVE:   Chief Complaint  Patient presents with   Medical Management of Chronic Issues   HPI Patient presents to clinic for follow-up chronic disease management.  Hypertension Asymptomatic. Takes amlodipine 2.5 mg daily and tolerating well.  Blood pressures at home 114/74.  Hyperlipidemia Recent lipid profile shows elevated LDL and triglycerides.  Have been chronically elevated in the past.  Recently seen cardiology and was started on statin therapy.  Has also been ordered coronary CT.  Has had some intermittent chest pain recently that required urgent care evaluation.  She was referred to cardiology who is currently doing anginal workup.  Echo she endorses continues to have intermittent midsternal pain lasting few seconds.   Chronic opioid and benzodiazepine use for history of low back pain with sciatica/lumbar radiculopathy Patient is concerned that if she has an acute flareup requiring use of opioid or nonopioid medication she will not be able to get treated at healthcare at the [facility].  She recently had her UDS screen which was negative for prescribed medications and expected as she only uses her medications for extreme flareups.  She takes Valium 1 to 2 mg as needed and Norco 5-325 mg 1-2 times a day for flareups.  She also is currently taking gabapentin 300 mg 3 times daily for pain management. Review of PDMP is appropriate    Recently seen Pulmonology.  Had sent in prescription for Qvar however patient reports very expensive at pharmacy.  She would like written prescription to send to [place] pharmacy if she can get this cheaper.  Will provide prescription.  She will discuss with Dr. Mervyn Skeeters at her next appointment.  PERTINENT PMH / PSH: Hypertension Hyperlipidemia OSA on CPAP Muscle spasms Lumbar radiculopathy Chronic low back pain Factor V Leiden mutation History on DVT s/p back surgery 2010, on Coumadin x 6 months Chronic intermittent opioid use Chronic  intermittent benzo use Family history paternal MI in his 66s, high cholesterol  OBJECTIVE:  BP 136/82   Pulse 72   Temp 97.8 F (36.6 C)   Ht 5\' 5"  (1.651 m)   Wt 183 lb (83 kg)   SpO2 96%   BMI 30.45 kg/m    Physical Exam Vitals reviewed.  Constitutional:      General: She is not in acute distress.    Appearance: Normal appearance. She is normal weight. She is not ill-appearing, toxic-appearing or diaphoretic.  HENT:     Right Ear: Tympanic membrane, ear canal and external ear normal.     Left Ear: Tympanic membrane, ear canal and external ear normal.  Eyes:     General:        Right eye: No discharge.        Left eye: No discharge.     Conjunctiva/sclera: Conjunctivae normal.  Neck:     Thyroid: No thyromegaly or thyroid tenderness.  Cardiovascular:     Rate and Rhythm: Normal rate and regular rhythm.     Heart sounds: Normal heart sounds.  Pulmonary:     Effort: Pulmonary effort is normal.     Breath sounds: Normal breath sounds.  Abdominal:     General: Bowel sounds are normal.  Musculoskeletal:        General: Normal range of motion.     Right lower leg: No edema.     Left lower leg: No edema.  Skin:    General: Skin is warm and dry.  Neurological:     General: No focal deficit present.  Mental Status: She is alert and oriented to person, place, and time. Mental status is at baseline.  Psychiatric:        Mood and Affect: Mood normal.        Behavior: Behavior normal.        Thought Content: Thought content normal.        Judgment: Judgment normal.     ASSESSMENT/PLAN:  Essential hypertension Assessment & Plan: Chronic.  Well-controlled per JNC 8 guidelines goal less than 150/90 for age Continue amlodipine 2.5 mg daily Continue to monitor blood pressure at home. Recent creatinine within normal limits.    Hypothyroidism, unspecified type Assessment & Plan: Chronic.  Asymptomatic.   Continue Synthroid 50 mcg daily     Hyperlipidemia,  unspecified hyperlipidemia type Assessment & Plan: Chronic. Now being worked up by cardiology. Recently started Lipitor 20 mg daily Awaiting Coronary CT Follow-up with cardiology   Chronic cough Assessment & Plan: Follows with Dr Vassie Loll Written prescription provided for Qvar 80 mcg inhaler 2 puffs BID   Orders: -     Qvar RediHaler; Inhale 2 puffs into the lungs 2 (two) times daily.  Dispense: 10.6 g; Refill: 0  Muscle spasm Assessment & Plan: Chronic.  Stable.   Continue Valium 1 to 2 mg at night for severe spasm PDMP reviewed and appropriate UDS negative, expected given no recent flare up Nonopioid contract signed   Lumbar radiculopathy Assessment & Plan: Chronic.  Stable Recent tox assure negative for benzos and opioids Consistent with intermittent use of Valium and Norco. Continue Valium 1-2 mg daily as needed Continue Norco 5-325 mg 1 tablet twice daily as needed PDMP reviewed and appropriate Letter provided for Health Care at the [facility] to allow for treatment of flare up   HCM Declined pneumonia vaccine Medicare annual wellness due Mammogram due 06/2023 Colonoscopy up-to-date.  PDMP reviewed  Return in about 6 months (around 05/14/2023) for PCP.  Dana Allan, MD

## 2022-11-12 ENCOUNTER — Ambulatory Visit (INDEPENDENT_AMBULATORY_CARE_PROVIDER_SITE_OTHER): Payer: Medicare Other | Admitting: Family Medicine

## 2022-11-12 ENCOUNTER — Encounter: Payer: Self-pay | Admitting: Family Medicine

## 2022-11-12 VITALS — BP 136/82 | HR 72 | Temp 97.8°F | Ht 65.0 in | Wt 183.0 lb

## 2022-11-12 DIAGNOSIS — E785 Hyperlipidemia, unspecified: Secondary | ICD-10-CM

## 2022-11-12 DIAGNOSIS — I10 Essential (primary) hypertension: Secondary | ICD-10-CM | POA: Diagnosis not present

## 2022-11-12 DIAGNOSIS — L309 Dermatitis, unspecified: Secondary | ICD-10-CM

## 2022-11-12 DIAGNOSIS — M62838 Other muscle spasm: Secondary | ICD-10-CM

## 2022-11-12 DIAGNOSIS — E039 Hypothyroidism, unspecified: Secondary | ICD-10-CM

## 2022-11-12 DIAGNOSIS — M5416 Radiculopathy, lumbar region: Secondary | ICD-10-CM

## 2022-11-12 DIAGNOSIS — R053 Chronic cough: Secondary | ICD-10-CM | POA: Diagnosis not present

## 2022-11-12 MED ORDER — QVAR REDIHALER 80 MCG/ACT IN AERB
2.0000 | INHALATION_SPRAY | Freq: Two times a day (BID) | RESPIRATORY_TRACT | 0 refills | Status: DC
Start: 2022-11-12 — End: 2023-01-29

## 2022-11-18 ENCOUNTER — Encounter: Payer: Self-pay | Admitting: Family Medicine

## 2022-11-18 NOTE — Assessment & Plan Note (Signed)
Follows with Dr Vassie Loll Written prescription provided for Qvar 80 mcg inhaler 2 puffs BID

## 2022-11-18 NOTE — Assessment & Plan Note (Signed)
Chronic. Now being worked up by cardiology. Recently started Lipitor 20 mg daily Awaiting Coronary CT Follow-up with cardiology

## 2022-11-18 NOTE — Assessment & Plan Note (Signed)
Chronic.  Stable Recent tox assure negative for benzos and opioids Consistent with intermittent use of Valium and Norco. Continue Valium 1-2 mg daily as needed Continue Norco 5-325 mg 1 tablet twice daily as needed PDMP reviewed and appropriate Letter provided for Health Care at the [facility] to allow for treatment of flare up

## 2022-11-18 NOTE — Assessment & Plan Note (Signed)
Chronic.  Well-controlled per JNC 8 guidelines goal less than 150/90 for age Continue amlodipine 2.5 mg daily Continue to monitor blood pressure at home. Recent creatinine within normal limits.  

## 2022-11-18 NOTE — Assessment & Plan Note (Signed)
Chronic.  Asymptomatic.   Continue Synthroid 50 mcg daily

## 2022-11-18 NOTE — Assessment & Plan Note (Signed)
Chronic.  Stable.   Continue Valium 1 to 2 mg at night for severe spasm PDMP reviewed and appropriate UDS negative, expected given no recent flare up Nonopioid contract signed

## 2022-11-26 ENCOUNTER — Telehealth (HOSPITAL_COMMUNITY): Payer: Self-pay | Admitting: *Deleted

## 2022-11-26 NOTE — Telephone Encounter (Signed)
Attempted to call patient regarding upcoming cardiac CT appointment. Husband answered phone and I left my name and call back number.  Larey Brick RN Navigator Cardiac Imaging [facility] [phone] Office [phone] Cell

## 2022-11-28 ENCOUNTER — Ambulatory Visit
Admission: RE | Admit: 2022-11-28 | Discharge: 2022-11-28 | Disposition: A | Discharge status: Home / Self Care | Payer: Medicare Other | Source: Ambulatory Visit | Attending: Cardiology | Admitting: Cardiology

## 2022-11-28 DIAGNOSIS — R072 Precordial pain: Secondary | ICD-10-CM | POA: Insufficient documentation

## 2022-11-28 DIAGNOSIS — R079 Chest pain, unspecified: Secondary | ICD-10-CM | POA: Diagnosis present

## 2022-11-28 MED ORDER — NITROGLYCERIN 0.4 MG SL SUBL
0.8000 mg | SUBLINGUAL_TABLET | Freq: Once | SUBLINGUAL | Status: AC
  Administered 2022-11-28: 0.8 mg via SUBLINGUAL
  Filled 2022-11-28: qty 25

## 2022-11-28 MED ORDER — NITROGLYCERIN 0.4 MG SL SUBL
SUBLINGUAL_TABLET | SUBLINGUAL | Status: AC
  Filled 2022-11-28: qty 2

## 2022-11-28 MED ORDER — IOHEXOL 350 MG/ML SOLN
80.0000 mL | Freq: Once | INTRAVENOUS | Status: AC | PRN
  Administered 2022-11-28: 80 mL via INTRAVENOUS

## 2022-11-28 NOTE — Progress Notes (Signed)
Patient tolerated procedure well. Ambulate w/o difficulty. Denies any lightheadedness or being dizzy. Pt denies any pain at this time. Sitting in chair, pt is encouraged to drink additional water throughout the day and reason explained to patient. Patient verbalized understanding and all questions answered. ABC intact. No further needs at this time. Discharge from procedure area w/o issues.  

## 2022-12-03 ENCOUNTER — Encounter: Payer: Self-pay | Admitting: Cardiology

## 2022-12-18 ENCOUNTER — Encounter: Payer: Self-pay | Admitting: Cardiology

## 2022-12-20 ENCOUNTER — Encounter (HOSPITAL_BASED_OUTPATIENT_CLINIC_OR_DEPARTMENT_OTHER): Payer: Self-pay | Admitting: Pulmonary Disease

## 2022-12-25 ENCOUNTER — Ambulatory Visit: Payer: Medicare Other | Attending: Cardiology

## 2022-12-25 DIAGNOSIS — R072 Precordial pain: Secondary | ICD-10-CM | POA: Insufficient documentation

## 2022-12-25 LAB — ECHOCARDIOGRAM COMPLETE
AR max vel: 2.43 cm2
AV Area VTI: 2.5 cm2
AV Area mean vel: 2.35 cm2
AV Mean grad: 3 mmHg
AV Peak grad: 5.9 mmHg
Ao pk vel: 1.22 m/s
Area-P 1/2: 3.65 cm2
Calc EF: 59.8 %
S' Lateral: 3 cm
Single Plane A2C EF: 63.9 %
Single Plane A4C EF: 59.4 %

## 2022-12-27 ENCOUNTER — Encounter: Payer: Self-pay | Admitting: Cardiology

## 2022-12-27 ENCOUNTER — Ambulatory Visit: Payer: Medicare Other | Attending: Cardiology | Admitting: Cardiology

## 2022-12-27 VITALS — BP 160/70 | HR 75 | Ht 65.0 in | Wt 183.1 lb

## 2022-12-27 DIAGNOSIS — I10 Essential (primary) hypertension: Secondary | ICD-10-CM | POA: Insufficient documentation

## 2022-12-27 DIAGNOSIS — I2510 Atherosclerotic heart disease of native coronary artery without angina pectoris: Secondary | ICD-10-CM | POA: Insufficient documentation

## 2022-12-27 DIAGNOSIS — E7800 Pure hypercholesterolemia, unspecified: Secondary | ICD-10-CM | POA: Insufficient documentation

## 2022-12-27 MED ORDER — REPATHA SURECLICK 140 MG/ML ~~LOC~~ SOAJ: 140.0000 mg | SUBCUTANEOUS | 2 refills | Status: DC

## 2022-12-27 NOTE — Progress Notes (Signed)
Cardiology Office Note:    Date:  12/27/2022   ID:  Tonya Green, DOB [birth year], MRN [id number]  PCP:  Dana Allan, MD   [facility] Providers Cardiologist:  Debbe Odea, MD     Referring MD: Dana Allan, MD   Chief Complaint  Patient presents with   Follow up Echo results.     Patient stopped atorvastatin on 11/26/2022 due to dizziness. Medications reviewed by the patient verbally.     History of Present Illness:    Tonya Green is a 72 y.o. female with a hx of hypertension, hyperlipidemia, DVT, factor V Leyden deficiency, neuropathy, hypothyroidism who presents for follow-up.  Previously seen due to symptoms of chest pain.  Echo and coronary CTA was obtained to evaluate any cardiac etiology.  Coronary CT 7/24 showed mild LAD, minimal left circumflex stenosis.  Echocardiogram showed normal EF 60 to 65%.  She takes nattokinase due to history of DVT, factor V Leyden deficiency.  Lipitor was started after last visit, patient developed dizziness from statin, prompting her to stop.  Symptoms resolved after stopping Lipitor.  Outside echo 2014 EF 55 to 60%  Past Medical History:  Diagnosis Date   Basal cell carcinoma    left nasal sidewall [facility] Dr[facility] Jeannine B[facility]   Carotid artery stenosis    mild L>R 03/26/16 [place]    Cataract of left eye 05/25/2021   Chronic cough 04/03/2022   Diverticulosis    colonoscopy 10/13/16 [place]   Factor 5 Leiden mutation, heterozygous (HCC)    Fatty liver    Heterozygous factor V Leiden mutation (HCC) 11/01/2017   History of blood transfusion    birth Rh factor    History of skin cancer 11/01/2017   H/o BCC forehead and SCC nose    Hyperlipidemia    Hypertension    Hyponatremia 12/18/2017   Leukocytosis 12/18/2017   OSA on CPAP    Radiculopathy    Right leg DVT (HCC)    2010 s/p back surgery was on Coumadin x 6 months    Thyroid disease    hypothyroidism    UTI (urinary tract infection) 08/21/2018     Past Surgical History:  Procedure Laterality Date   APPENDECTOMY     2000   BACK SURGERY     BREAST EXCISIONAL BIOPSY Left 2005   for infection   BREAST SURGERY     biospy ? year    COLONOSCOPY     COLONOSCOPY WITH PROPOFOL N/A 01/01/2022   Procedure: COLONOSCOPY WITH PROPOFOL;  Surgeon: Regis Bill, MD;  Location: [facility] ENDOSCOPY;  Service: Endoscopy;  Laterality: N/A;   MOHS SURGERY  05/11/2019   left nasal side wall bcc Dr. Jeannine Boga in [place]    ruptured disc repair     L4/5 10/2008 with complications residual right leg weakness/reduced sensation and DVT in 2010 post surgery     Current Medications: Current Meds  Medication Sig   amLODipine (NORVASC) 2.5 MG tablet Take 1 tablet (2.5 mg total) by mouth daily. In am if BP>140/?90 take another tablet   Ascorbic Acid (VITAMIN C) 1000 MG tablet Take 1,000 mg by mouth 3 (three) times daily.   Cholecalciferol (VITAMIN D3) 5000 units CAPS Take by mouth.   diazepam (VALIUM) 2 MG tablet TAKE 1/2 TO 1 TABLET BY MOUTH EVERY NIGHT AT BEDTIME AS NEEDED   Evolocumab (REPATHA SURECLICK) 140 MG/ML SOAJ Inject 140 mg into the skin every 14 (fourteen) days.   fexofenadine (ALLEGRA ALLERGY) 180 MG  tablet Take 1 tablet (180 mg total) by mouth daily.   fluticasone (FLONASE) 50 MCG/ACT nasal spray Place 2 sprays into both nostrils daily.   gabapentin (NEURONTIN) 100 MG capsule TAKE 3 CAPSULES(300 MG) BY MOUTH THREE TIMES DAILY   HYDROcodone-acetaminophen (NORCO/VICODIN) 5-325 MG tablet Take 1 tablet by mouth 2 (two) times daily as needed (only for flare ups).   levothyroxine (SYNTHROID) 50 MCG tablet Take 1 tablet (50 mcg total) by mouth daily before breakfast.   MAGNESIUM MALATE PO Take 625 mg by mouth.   Nattokinase 100 MG CAPS Take 100 mg by mouth daily.   Strontium Chloride POWD Use as directed 500 mg in the mouth or throat.      Allergies:   Macrobid [nitrofurantoin macrocrystal], Statins, and Sulfa antibiotics   Social History    Socioeconomic History   Marital status: Married    Spouse name: Not on file   Number of children: 2   Years of education: Not on file   Highest education level: Not on file  Occupational History   Occupation: Retired  Tobacco Use   Smoking status: Never   Smokeless tobacco: Never  Vaping Use   Vaping status: Never Used  Substance and Sexual Activity   Alcohol use: Yes    Comment: occ   Drug use: Yes    Types: Hydrocodone    Comment: PRN for pain flare ups   Sexual activity: Not on file  Other Topics Concern   Not on file  Social History Narrative   BSN CCM, RN   Moved from [place] recently    2 daughters    Retired and married    No guns, wears seat belt, safe in relationship    Lives at [employer]    Social Determinants of Health   Financial Resource Strain: Low Risk  (10/17/2022)   Overall Financial Resource Strain (CARDIA)    Difficulty of Paying Living Expenses: Not hard at all  Food Insecurity: No Food Insecurity (10/30/2022)   Hunger Vital Sign    Worried About Running Out of Food in the Last Year: Never true    Ran Out of Food in the Last Year: Never true  Transportation Needs: No Transportation Needs (10/30/2022)   PRAPARE - Administrator, Civil Service (Medical): No    Lack of Transportation (Non-Medical): No  Physical Activity: Sufficiently Active (10/17/2022)   Exercise Vital Sign    Days of Exercise per Week: 5 days    Minutes of Exercise per Session: 30 min  Stress: No Stress Concern Present (10/17/2022)   [employer] of Occupational Health - Occupational Stress Questionnaire    Feeling of Stress : Not at all  Social Connections: Moderately Integrated (10/17/2022)   Social Connection and Isolation Panel [NHANES]    Frequency of Communication with Friends and Family: More than three times a week    Frequency of Social Gatherings with Friends and Family: More than three times a week    Attends Religious Services: Never    Loss adjuster, chartered or Organizations: Yes    Attends Engineer, structural: More than 4 times per year    Marital Status: Married     Family History: The patient's family history includes Esophageal cancer in her brother; Heart disease in her father; Hyperlipidemia in an other family member; Hypertension in her mother; Liver cancer in her paternal aunt; Other in her daughter; Stroke in her brother, father, mother, and paternal grandfather.  ROS:  Please see the history of present illness.     All other systems reviewed and are negative.  EKGs/Labs/Other Studies Reviewed:    The following studies were reviewed today:   EKG:  EKG is  ordered today.  The ekg ordered today demonstrates normal sinus rhythm, normal ECG.  Recent Labs: 09/24/2022: ALT 31 10/20/2022: BUN 16; Creatinine, Ser 0.80; Hemoglobin 13.0; Platelets 253; Potassium 3.8; Sodium 138  Recent Lipid Panel    Component Value Date/Time   CHOL 303 (H) 09/24/2022 0748   TRIG 150.0 (H) 09/24/2022 0748   HDL 51.50 09/24/2022 0748   CHOLHDL 6 09/24/2022 0748   VLDL 30.0 09/24/2022 0748   LDLCALC 221 (H) 09/24/2022 0748     Risk Assessment/Calculations:         Physical Exam:    VS:  BP (!) 160/70 (BP Location: Left Arm, Patient Position: Sitting, Cuff Size: Normal)   Pulse 75   Ht 5\' 5"  (1.651 m)   Wt 183 lb 2 oz (83.1 kg)   SpO2 96%   BMI 30.47 kg/m     Wt Readings from Last 3 Encounters:  12/27/22 183 lb 2 oz (83.1 kg)  11/12/22 183 lb (83 kg)  10/30/22 180 lb 1.6 oz (81.7 kg)     GEN:  Well nourished, well developed in no acute distress HEENT: Normal CARDIAC: RRR, no murmurs, rubs, gallops RESPIRATORY:  Clear to auscultation without rales, wheezing or rhonchi  ABDOMEN: Soft, non-tender, non-distended MUSCULOSKELETAL:  No edema; No deformity  SKIN: Warm and dry NEUROLOGIC:  Alert and oriented x 3 PSYCHIATRIC:  Normal affect   ASSESSMENT:    1. Coronary artery disease involving native coronary artery  of native heart, unspecified whether angina present   2. Pure hypercholesterolemia   3. Primary hypertension    PLAN:    In order of problems listed above:  Nonobstructive CAD, 25% proximal LAD stenosis, minimal left circumflex stenosis on coronary CT. echo with normal EF 60 to 65%.  Patient on nattokinase.  Not tolerant to statin.  Start PCSK9. Hyperlipidemia, LDL 221.  Did not tolerate statin/Lipitor.  Start PCSK9.  Recheck lipid panel in 3 months. Hypertension, BP elevated today, usually controlled likely from anxiety.  Continue Norvasc 2.5 mg daily.    Follow-up in 3 months.  Medication Adjustments/Labs and Tests Ordered: Current medicines are reviewed at length with the patient today.  Concerns regarding medicines are outlined above.  Orders Placed This Encounter  Procedures   Lipid panel   Meds ordered this encounter  Medications   Evolocumab (REPATHA SURECLICK) 140 MG/ML SOAJ    Sig: Inject 140 mg into the skin every 14 (fourteen) days.    Dispense:  2 mL    Refill:  2    Patient Instructions  Medication Instructions:   Start Repatha - Inject one pen into skin every 14 days.   *If you need a refill on your cardiac medications before your next appointment, please call your pharmacy*   Lab Work:  Your provider would like for you to return in 3 months (on or around 03/29/2023) to have the following labs drawn: lipid.   Please go to [facility] [address] ([facility] [address], [place] [address] You do not need an appointment.  They are open from 7:30 am-4 pm.  Lunch from 1:00 pm- 2:00 pm You WILL need to be fasting.    If you have labs (blood work) drawn today and your tests are completely normal, you will receive  your results only by: MyChart Message (if you have MyChart) OR A paper copy in the mail If you have any lab test that is abnormal or we need to change your treatment, we will call you to review the  results.   Testing/Procedures:  None Ordered   Follow-Up: At [facility], you and your health needs are our priority.  As part of our continuing mission to provide you with exceptional heart care, we have created designated Provider Care Teams.  These Care Teams include your primary Cardiologist (physician) and Advanced Practice Providers (APPs -  Physician Assistants and Nurse Practitioners) who all work together to provide you with the care you need, when you need it.  We recommend signing up for the patient portal called "MyChart".  Sign up information is provided on this After Visit Summary.  MyChart is used to connect with patients for Virtual Visits (Telemedicine).  Patients are able to view lab/test results, encounter notes, upcoming appointments, etc.  Non-urgent messages can be sent to your provider as well.   To learn more about what you can do with MyChart, go to [email or web address].    Your next appointment:   3 months   Provider:   You may see Debbe Odea, MD or one of the following Advanced Practice Providers on your designated Care Team:   Nicolasa Ducking, NP Eula Listen, PA-C Cadence Fransico Michael, PA-C Charlsie Quest, NP   Signed, Debbe Odea, MD  12/27/2022 10:11 AM    [facility]

## 2022-12-27 NOTE — Patient Instructions (Addendum)
Medication Instructions:   Start Repatha - Inject one pen into skin every 14 days.   *If you need a refill on your cardiac medications before your next appointment, please call your pharmacy*   Lab Work:  Your provider would like for you to return in 3 months (on or around 03/29/2023) to have the following labs drawn: lipid.   Please go to [facility] [address] ([facility] [address], [place] [address] You do not need an appointment.  They are open from 7:30 am-4 pm.  Lunch from 1:00 pm- 2:00 pm You WILL need to be fasting.    If you have labs (blood work) drawn today and your tests are completely normal, you will receive your results only by: MyChart Message (if you have MyChart) OR A paper copy in the mail If you have any lab test that is abnormal or we need to change your treatment, we will call you to review the results.   Testing/Procedures:  None Ordered   Follow-Up: At [facility], you and your health needs are our priority.  As part of our continuing mission to provide you with exceptional heart care, we have created designated Provider Care Teams.  These Care Teams include your primary Cardiologist (physician) and Advanced Practice Providers (APPs -  Physician Assistants and Nurse Practitioners) who all work together to provide you with the care you need, when you need it.  We recommend signing up for the patient portal called "MyChart".  Sign up information is provided on this After Visit Summary.  MyChart is used to connect with patients for Virtual Visits (Telemedicine).  Patients are able to view lab/test results, encounter notes, upcoming appointments, etc.  Non-urgent messages can be sent to your provider as well.   To learn more about what you can do with MyChart, go to [email or web address].    Your next appointment:   3 months   Provider:   You may see Debbe Odea, MD or one of the following Advanced Practice  Providers on your designated Care Team:   Nicolasa Ducking, NP Eula Listen, PA-C Cadence Fransico Michael, PA-C Charlsie Quest, NP

## 2022-12-31 ENCOUNTER — Telehealth: Payer: Self-pay | Admitting: Cardiology

## 2022-12-31 NOTE — Telephone Encounter (Signed)
Pharmacy calling to f/u on Prior Auth for Repatha. Please advise

## 2023-01-01 ENCOUNTER — Telehealth: Payer: Self-pay | Admitting: Pharmacy Technician

## 2023-01-01 ENCOUNTER — Other Ambulatory Visit (HOSPITAL_COMMUNITY): Payer: Self-pay

## 2023-01-01 NOTE — Telephone Encounter (Signed)
PA request has been Submitted. New Encounter created for follow up. For additional info see Pharmacy Prior Auth telephone encounter from 01/01/2023.

## 2023-01-01 NOTE — Telephone Encounter (Signed)
Pharmacy Patient Advocate Encounter   Received notification from CoverMyMeds that prior authorization for Repatha SureClick 140MG /ML auto-injectors is required/requested.   Insurance verification completed.   The patient is insured through [facility] .   Per test claim: PA required; PA submitted to [facility] via CoverMyMeds Key/confirmation #/EOC [id number] Status is pending

## 2023-01-02 ENCOUNTER — Other Ambulatory Visit (HOSPITAL_COMMUNITY): Payer: Self-pay

## 2023-01-02 NOTE — Telephone Encounter (Signed)
Pharmacy Patient Advocate Encounter  Received notification from [facility] that Prior Authorization for Repatha SureClick 140MG /ML auto-injectors has been APPROVED from 01/01/2023 to 07/04/2023. Ran test claim, Copay is $358.70 due to a deductible. This test claim was processed through [facility]- copay amounts may vary at other pharmacies due to pharmacy/plan contracts, or as the patient moves through the different stages of their insurance plan.   PA #/Case ID/Reference #: [id number]

## 2023-01-03 MED ORDER — NEXLIZET 180-10 MG PO TABS: 1.0000 | ORAL_TABLET | Freq: Every day | ORAL | 0 refills | Status: DC

## 2023-01-03 NOTE — Telephone Encounter (Signed)
Spoke with patient and made her aware that her repatha did get approved however the copay is $358.70. Patient reports that she can not pay that.   Reviewed Dr. Azucena Cecil recommendations as followed:  "[place] please inform patient of below.  If cost prohibitive, start Nexlizet at 1 tab daily."    Patient was agreeable to start nexlizet.  Confirmed pharmacy  Patient was thankful for the call.

## 2023-01-03 NOTE — Addendum Note (Signed)
Addended by: Margrett Rud on: 01/03/2023 12:48 PM   Modules accepted: Orders

## 2023-01-04 ENCOUNTER — Other Ambulatory Visit (HOSPITAL_COMMUNITY): Payer: Self-pay

## 2023-01-04 ENCOUNTER — Telehealth: Payer: Self-pay

## 2023-01-04 NOTE — Telephone Encounter (Signed)
Pharmacy Patient Advocate Encounter   Received notification from Physician's Office/BRITTANY SLAYTON that prior authorization for {NEXLIZET is required/requested.   Insurance verification completed.   The patient is insured through [facility] .   Per test claim: PA required; PA submitted to [facility] via CoverMyMeds Key/confirmation #/EOC [id number]       Status is pending

## 2023-01-08 ENCOUNTER — Other Ambulatory Visit: Payer: Self-pay

## 2023-01-08 MED ORDER — NEXLIZET 180-10 MG PO TABS: 1.0000 | ORAL_TABLET | Freq: Every day | ORAL | 3 refills | Status: DC

## 2023-01-08 NOTE — Telephone Encounter (Signed)
Nexliet Approved, see new encounter for more detailed information

## 2023-01-08 NOTE — Telephone Encounter (Signed)
Pharmacy Patient Advocate Encounter  Received notification from [facility] that Prior Authorization for Nexlizet has been  Approved through 12.31.24

## 2023-01-16 ENCOUNTER — Encounter: Payer: Self-pay | Admitting: Cardiology

## 2023-01-29 ENCOUNTER — Encounter: Payer: Self-pay | Admitting: Nurse Practitioner

## 2023-01-29 ENCOUNTER — Telehealth: Payer: Self-pay

## 2023-01-29 ENCOUNTER — Encounter: Payer: Self-pay | Admitting: Family Medicine

## 2023-01-29 ENCOUNTER — Ambulatory Visit (INDEPENDENT_AMBULATORY_CARE_PROVIDER_SITE_OTHER): Payer: Medicare Other | Admitting: Nurse Practitioner

## 2023-01-29 VITALS — BP 124/76 | HR 75 | Temp 98.2°F | Ht 65.0 in | Wt 186.0 lb

## 2023-01-29 DIAGNOSIS — E039 Hypothyroidism, unspecified: Secondary | ICD-10-CM

## 2023-01-29 DIAGNOSIS — R350 Frequency of micturition: Secondary | ICD-10-CM | POA: Diagnosis not present

## 2023-01-29 DIAGNOSIS — R300 Dysuria: Secondary | ICD-10-CM

## 2023-01-29 LAB — POC URINALSYSI DIPSTICK (AUTOMATED)
Bilirubin, UA: NEGATIVE
Blood, UA: NEGATIVE
Glucose, UA: NEGATIVE
Ketones, UA: NEGATIVE
Nitrite, UA: NEGATIVE
Protein, UA: NEGATIVE
Spec Grav, UA: <=1.005 — AB (ref 1.010–1.025)
Urobilinogen, UA: 0.2 U/dL
pH, UA: 6 (ref 5.0–8.0)

## 2023-01-29 NOTE — Patient Instructions (Signed)
Culture result pending. Will call with the result. Advised proper hydration.

## 2023-01-29 NOTE — Telephone Encounter (Signed)
Patient is in the office and is requesting a refill on Hydrocodone but Kara Dies, NP states that she needs to talk with her PCP so she asked me to let you know.

## 2023-01-30 ENCOUNTER — Other Ambulatory Visit: Payer: Self-pay | Admitting: Family Medicine

## 2023-01-30 DIAGNOSIS — G8929 Other chronic pain: Secondary | ICD-10-CM

## 2023-01-30 LAB — URINE CULTURE
MICRO NUMBER:: 15413295
SPECIMEN QUALITY:: ADEQUATE

## 2023-01-30 MED ORDER — LEVOTHYROXINE SODIUM 50 MCG PO TABS
50.0000 ug | ORAL_TABLET | Freq: Every day | ORAL | 3 refills | Status: DC
Start: 2023-01-30 — End: 2024-02-05

## 2023-01-30 MED ORDER — HYDROCODONE-ACETAMINOPHEN 5-325 MG PO TABS
1.0000 | ORAL_TABLET | Freq: Two times a day (BID) | ORAL | 0 refills | Status: DC | PRN
Start: 2023-01-30 — End: 2023-10-31

## 2023-01-30 NOTE — Telephone Encounter (Signed)
Refill for levo has been sent to pharmacy. Pt is requesting a refill for hydrocodone-  last filled 12/15/21 written by McLean-Scocuzza, Pasty Spillers, MD

## 2023-01-31 ENCOUNTER — Encounter: Payer: Self-pay | Admitting: Ophthalmology

## 2023-01-31 NOTE — Telephone Encounter (Signed)
Called and notified pt that refill has been sent in.

## 2023-02-03 NOTE — Progress Notes (Signed)
Established Patient Office Visit  Subjective:  Patient ID: Tonya Green, female    DOB: 12-Aug-1950  Age: 72 y.o. MRN: [id number]  CC:  Chief Complaint  Patient presents with   Acute Visit    AZO bladder test positive on 01/29/23    HPI  Tonya Green presents for positive AZO home UTI test this morning.  She has been seen at [facility] clinic on 01/21/23 due to dysuria and was started on Cipro.  She has completed the course of Cipro.  Plan she would like to get the urine tested for UTI.  She denies fever, abdominal pain or hematuria.  HPI   Past Medical History:  Diagnosis Date   Basal cell carcinoma    left nasal sidewall [facility]   Carotid artery stenosis    mild L>R 03/26/16 [place]    Cataract of left eye 05/25/2021   Chronic cough 04/03/2022   Complication of anesthesia    slow to wake   Diverticulosis    colonoscopy 10/13/16 [place]   Factor 5 Leiden mutation, heterozygous (HCC)    Fatty liver    Heterozygous factor V Leiden mutation (HCC) 11/01/2017   History of blood transfusion    birth Rh factor    History of skin cancer 11/01/2017   H/o BCC forehead and SCC nose    Hyperlipidemia    Hypertension    Hyponatremia 12/18/2017   Leukocytosis 12/18/2017   OSA on CPAP    Radiculopathy    Right leg DVT (HCC)    2010 s/p back surgery was on Coumadin x 6 months    Thyroid disease    hypothyroidism    UTI (urinary tract infection) 08/21/2018    Past Surgical History:  Procedure Laterality Date   APPENDECTOMY     2000   BACK SURGERY     BREAST EXCISIONAL BIOPSY Left 2005   for infection   BREAST SURGERY     biospy ? year    COLONOSCOPY     COLONOSCOPY WITH PROPOFOL N/A 01/01/2022   Procedure: COLONOSCOPY WITH PROPOFOL;  Surgeon: Regis Bill, MD;  Location: [facility] ENDOSCOPY;  Service: Endoscopy;  Laterality: N/A;   MOHS SURGERY  05/11/2019   left nasal side wall bcc Dr. Jeannine Boga in [place]    ruptured disc repair     L4/5 10/2008  with complications residual right leg weakness/reduced sensation and DVT in 2010 post surgery     Family History  Problem Relation Age of Onset   Hypertension Mother    Stroke Mother        in 28s    Heart disease Father    Stroke Father    Stroke Brother    Esophageal cancer Brother    Liver cancer Paternal Aunt    Stroke Paternal Grandfather    Other Daughter        chronic back pain nerve sheath abnormal in spine s/p surgery   Hyperlipidemia Other     Social History   Socioeconomic History   Marital status: Married    Spouse name: Not on file   Number of children: 2   Years of education: Not on file   Highest education level: Not on file  Occupational History   Occupation: Retired  Tobacco Use   Smoking status: Never   Smokeless tobacco: Never  Vaping Use   Vaping status: Never Used  Substance and Sexual Activity   Alcohol use: Yes    Comment: occ  Drug use: Yes    Types: Hydrocodone    Comment: PRN for pain flare ups   Sexual activity: Not on file  Other Topics Concern   Not on file  Social History Narrative   BSN CCM, RN   Moved from [place] recently    2 daughters    Retired and married    No guns, wears seat belt, safe in relationship    Lives at [employer]    Social Determinants of Health   Financial Resource Strain: Low Risk  (10/17/2022)   Overall Financial Resource Strain (CARDIA)    Difficulty of Paying Living Expenses: Not hard at all  Food Insecurity: No Food Insecurity (10/30/2022)   Hunger Vital Sign    Worried About Running Out of Food in the Last Year: Never true    Ran Out of Food in the Last Year: Never true  Transportation Needs: No Transportation Needs (10/30/2022)   PRAPARE - Administrator, Civil Service (Medical): No    Lack of Transportation (Non-Medical): No  Physical Activity: Sufficiently Active (10/17/2022)   Exercise Vital Sign    Days of Exercise per Week: 5 days    Minutes of Exercise per Session: 30 min   Stress: No Stress Concern Present (10/17/2022)   [employer] of Occupational Health - Occupational Stress Questionnaire    Feeling of Stress : Not at all  Social Connections: Moderately Integrated (10/17/2022)   Social Connection and Isolation Panel [NHANES]    Frequency of Communication with Friends and Family: More than three times a week    Frequency of Social Gatherings with Friends and Family: More than three times a week    Attends Religious Services: Never    Database administrator or Organizations: Yes    Attends Engineer, structural: More than 4 times per year    Marital Status: Married  Catering manager Violence: Not At Risk (10/30/2022)   Humiliation, Afraid, Rape, and Kick questionnaire    Fear of Current or Ex-Partner: No    Emotionally Abused: No    Physically Abused: No    Sexually Abused: No     Outpatient Medications Prior to Visit  Medication Sig Dispense Refill   amLODipine (NORVASC) 2.5 MG tablet Take 1 tablet (2.5 mg total) by mouth daily. In am if BP>140/?90 take another tablet 180 tablet 3   Ascorbic Acid (VITAMIN C) 1000 MG tablet Take 1,000 mg by mouth 3 (three) times daily.     Cholecalciferol (VITAMIN D3) 5000 units CAPS Take by mouth.     diazepam (VALIUM) 2 MG tablet TAKE 1/2 TO 1 TABLET BY MOUTH EVERY NIGHT AT BEDTIME AS NEEDED 30 tablet 0   fexofenadine (ALLEGRA ALLERGY) 180 MG tablet Take 1 tablet (180 mg total) by mouth daily. (Patient not taking: Reported on 01/31/2023) 90 tablet 3   fluticasone (FLONASE) 50 MCG/ACT nasal spray Place 2 sprays into both nostrils daily. (Patient not taking: Reported on 01/31/2023) 16 g 6   gabapentin (NEURONTIN) 100 MG capsule TAKE 3 CAPSULES(300 MG) BY MOUTH THREE TIMES DAILY 560 capsule 11   hydrocortisone 2.5 % ointment Apply topically 2 (two) times daily. Apply to affected area two times a day as needed for 5 days 30 g 0   MAGNESIUM MALATE PO Take 375 mg by mouth.     Nattokinase 100 MG CAPS Take by mouth  daily. 2000 iu     Strontium Chloride POWD Use as directed 250 mg in the mouth  or throat.     beclomethasone (QVAR REDIHALER) 80 MCG/ACT inhaler Inhale 2 puffs into the lungs 2 (two) times daily. 10.6 g 0   Bempedoic Acid-Ezetimibe (NEXLIZET) 180-10 MG TABS Take 1 tablet by mouth daily. 30 tablet 3   HYDROcodone-acetaminophen (NORCO/VICODIN) 5-325 MG tablet Take 1 tablet by mouth 2 (two) times daily as needed (only for flare ups). 30 tablet 0   levothyroxine (SYNTHROID) 50 MCG tablet Take 1 tablet (50 mcg total) by mouth daily before breakfast. 90 tablet 3   No facility-administered medications prior to visit.    Allergies  Allergen Reactions   Macrobid [Nitrofurantoin Macrocrystal] Shortness Of Breath and Nausea Only   Shellfish Allergy Diarrhea and Nausea And Vomiting    Lobster only.  Causes food poisoning like symptoms   Statins Other (See Comments)    DIZZINESS    Sulfa Antibiotics     Swelling      ROS Review of Systems Negative unless indicated in HPI.    Objective:    Physical Exam Constitutional:      Appearance: Normal appearance.  Cardiovascular:     Rate and Rhythm: Normal rate and regular rhythm.     Pulses: Normal pulses.     Heart sounds: Normal heart sounds.  Abdominal:     General: Bowel sounds are normal.     Palpations: Abdomen is soft.     Tenderness: There is no abdominal tenderness. There is no right CVA tenderness or left CVA tenderness.  Musculoskeletal:     Cervical back: Normal range of motion.  Neurological:     General: No focal deficit present.     Mental Status: She is alert. Mental status is at baseline.  Psychiatric:        Mood and Affect: Mood normal.        Behavior: Behavior normal.        Thought Content: Thought content normal.        Judgment: Judgment normal.     BP 124/76   Pulse 75   Temp 98.2 F (36.8 C)   Ht 5\' 5"  (1.651 m)   Wt 186 lb (84.4 kg)   SpO2 97%   BMI 30.95 kg/m  Wt Readings from Last 3 Encounters:   01/29/23 186 lb (84.4 kg)  12/27/22 183 lb 2 oz (83.1 kg)  11/12/22 183 lb (83 kg)     Health Maintenance  Topic Date Due   Pneumonia Vaccine 24+ Years old (1 of 1 - PCV) Never done   INFLUENZA VACCINE  12/27/2022   COVID-19 Vaccine (8 - 2023-24 season) 01/27/2023   MAMMOGRAM  07/17/2023   Medicare Annual Wellness (AWV)  10/17/2023   DTaP/Tdap/Td (2 - Td or Tdap) 06/28/2025   Colonoscopy  01/04/2032   DEXA SCAN  Completed   Hepatitis C Screening  Completed   Zoster Vaccines- Shingrix  Completed   HPV VACCINES  Aged Out    There are no preventive care reminders to display for this patient.  Lab Results  Component Value Date   TSH 2.54 11/24/2021   Lab Results  Component Value Date   WBC 5.3 10/20/2022   HGB 13.0 10/20/2022   HCT 40.6 10/20/2022   MCV 94.2 10/20/2022   PLT 253 10/20/2022   Lab Results  Component Value Date   NA 138 10/20/2022   K 3.8 10/20/2022   CO2 25 10/20/2022   GLUCOSE 105 (H) 10/20/2022   BUN 16 10/20/2022   CREATININE 0.80 10/20/2022   BILITOT  0.5 09/24/2022   ALKPHOS 84 09/24/2022   AST 25 09/24/2022   ALT 31 09/24/2022   PROT 7.5 09/24/2022   ALBUMIN 4.1 09/24/2022   CALCIUM 8.8 (L) 10/20/2022   ANIONGAP 8 10/20/2022   GFR 84.80 09/24/2022   Lab Results  Component Value Date   CHOL 303 (H) 09/24/2022   Lab Results  Component Value Date   HDL 51.50 09/24/2022   Lab Results  Component Value Date   LDLCALC 221 (H) 09/24/2022   Lab Results  Component Value Date   TRIG 150.0 (H) 09/24/2022   Lab Results  Component Value Date   CHOLHDL 6 09/24/2022   Lab Results  Component Value Date   HGBA1C 6.1 09/24/2022      Assessment & Plan:  Urine frequency Assessment & Plan: The POCT urinalysis negative for blood, nitrite and positive for trace leukocyte. Urine culture pending. Advised to increase fluid intake and take over-the-counter AZO for pain as needed  Orders: -     POCT Urinalysis Dipstick (Automated) -      Urine Culture    Follow-up: Return if symptoms worsen or fail to improve.   Kara Dies, NP

## 2023-02-07 NOTE — Discharge Instructions (Signed)

## 2023-02-10 DIAGNOSIS — R350 Frequency of micturition: Secondary | ICD-10-CM | POA: Insufficient documentation

## 2023-02-10 NOTE — Assessment & Plan Note (Signed)
The POCT urinalysis negative for blood, nitrite and positive for trace leukocyte. Urine culture pending. Advised to increase fluid intake and take over-the-counter AZO for pain as needed

## 2023-02-11 ENCOUNTER — Ambulatory Visit: Payer: Medicare Other | Admitting: Anesthesiology

## 2023-02-11 ENCOUNTER — Other Ambulatory Visit: Payer: Self-pay

## 2023-02-11 ENCOUNTER — Ambulatory Visit
Admission: RE | Admit: 2023-02-11 | Discharge: 2023-02-11 | Disposition: A | Discharge status: Home / Self Care | Payer: Medicare Other | Attending: Ophthalmology | Admitting: Ophthalmology

## 2023-02-11 ENCOUNTER — Encounter: Payer: Self-pay | Admitting: Ophthalmology

## 2023-02-11 ENCOUNTER — Encounter
Admission: RE | Disposition: A | Discharge status: Home / Self Care | Payer: Self-pay | Source: Home / Self Care | Attending: Ophthalmology

## 2023-02-11 DIAGNOSIS — G4730 Sleep apnea, unspecified: Secondary | ICD-10-CM | POA: Insufficient documentation

## 2023-02-11 DIAGNOSIS — E039 Hypothyroidism, unspecified: Secondary | ICD-10-CM | POA: Insufficient documentation

## 2023-02-11 DIAGNOSIS — Z86718 Personal history of other venous thrombosis and embolism: Secondary | ICD-10-CM | POA: Diagnosis not present

## 2023-02-11 DIAGNOSIS — D6851 Activated protein C resistance: Secondary | ICD-10-CM | POA: Insufficient documentation

## 2023-02-11 DIAGNOSIS — I10 Essential (primary) hypertension: Secondary | ICD-10-CM | POA: Insufficient documentation

## 2023-02-11 DIAGNOSIS — K760 Fatty (change of) liver, not elsewhere classified: Secondary | ICD-10-CM | POA: Insufficient documentation

## 2023-02-11 DIAGNOSIS — G4733 Obstructive sleep apnea (adult) (pediatric): Secondary | ICD-10-CM | POA: Diagnosis not present

## 2023-02-11 DIAGNOSIS — E785 Hyperlipidemia, unspecified: Secondary | ICD-10-CM | POA: Diagnosis not present

## 2023-02-11 DIAGNOSIS — H2512 Age-related nuclear cataract, left eye: Secondary | ICD-10-CM | POA: Insufficient documentation

## 2023-02-11 HISTORY — DX: Other complications of anesthesia, initial encounter: T88.59XA

## 2023-02-11 HISTORY — PX: CATARACT EXTRACTION W/PHACO: SHX586

## 2023-02-11 SURGERY — PHACOEMULSIFICATION, CATARACT, WITH IOL INSERTION
Anesthesia: Monitor Anesthesia Care | Laterality: Left

## 2023-02-11 MED ORDER — MIDAZOLAM HCL 5 MG/5ML IJ SOLN
INTRAMUSCULAR | Status: DC | PRN
Start: 2023-02-11 — End: 2023-02-11
  Administered 2023-02-11: 1 mg via INTRAVENOUS

## 2023-02-11 MED ORDER — SIGHTPATH DOSE#1 BSS IO SOLN
INTRAOCULAR | Status: DC | PRN
  Administered 2023-02-11: 15 mL via INTRAOCULAR

## 2023-02-11 MED ORDER — ARMC OPHTHALMIC DILATING DROPS
1.0000 | OPHTHALMIC | Status: DC | PRN
  Administered 2023-02-11 (×3): 1 via OPHTHALMIC

## 2023-02-11 MED ORDER — MOXIFLOXACIN HCL 0.5 % OP SOLN
OPHTHALMIC | Status: DC | PRN
  Administered 2023-02-11: .2 mL via OPHTHALMIC

## 2023-02-11 MED ORDER — TETRACAINE HCL 0.5 % OP SOLN
1.0000 [drp] | OPHTHALMIC | Status: DC | PRN
  Administered 2023-02-11 (×3): 1 [drp] via OPHTHALMIC

## 2023-02-11 MED ORDER — FENTANYL CITRATE (PF) 100 MCG/2ML IJ SOLN
INTRAMUSCULAR | Status: DC | PRN
  Administered 2023-02-11: 50 ug via INTRAVENOUS

## 2023-02-11 MED ORDER — LACTATED RINGERS IV SOLN: INTRAVENOUS | Status: DC

## 2023-02-11 MED ORDER — SIGHTPATH DOSE#1 BSS IO SOLN
INTRAOCULAR | Status: DC | PRN
  Administered 2023-02-11: 95 mL via OPHTHALMIC

## 2023-02-11 MED ORDER — SODIUM CHLORIDE 0.9% FLUSH
INTRAVENOUS | Status: DC | PRN
Start: 2023-02-11 — End: 2023-02-11
  Administered 2023-02-11: 10 mL via INTRAVENOUS

## 2023-02-11 MED ORDER — LIDOCAINE HCL (PF) 2 % IJ SOLN
INTRAOCULAR | Status: DC | PRN
  Administered 2023-02-11: 1 mL via INTRAOCULAR

## 2023-02-11 MED ORDER — SIGHTPATH DOSE#1 NA HYALUR & NA CHOND-NA HYALUR IO KIT
PACK | INTRAOCULAR | Status: DC | PRN
  Administered 2023-02-11: 1 via OPHTHALMIC

## 2023-02-11 SURGICAL SUPPLY — 12 items
CATARACT SUITE SIGHTPATH (MISCELLANEOUS) ×1
DISSECTOR HYDRO NUCLEUS 50X22 (MISCELLANEOUS) ×1 IMPLANT
FEE CATARACT SUITE SIGHTPATH (MISCELLANEOUS) ×1 IMPLANT
GLOVE SURG GAMMEX PI TX LF 7.5 (GLOVE) ×1 IMPLANT
GLOVE SURG SYN 8.5 E (GLOVE) ×1
GLOVE SURG SYN 8.5 PF PI (GLOVE) ×1 IMPLANT
LENS CLAREON VIVITY TORIC 230 ×1 IMPLANT
LENS IOL CLRN VT TRC 3 23.0 IMPLANT
NDL FILTER BLUNT 18X1 1/2 (NEEDLE) ×1 IMPLANT
NEEDLE FILTER BLUNT 18X1 1/2 (NEEDLE) ×1
SYR 3ML LL SCALE MARK (SYRINGE) ×1 IMPLANT
SYR 5ML LL (SYRINGE) ×1 IMPLANT

## 2023-02-11 NOTE — Transfer of Care (Signed)
Immediate Anesthesia Transfer of Care Note  Patient: Tonya Green  Procedure(s) Performed: CATARACT EXTRACTION PHACO AND INTRAOCULAR LENS PLACEMENT (IOC) LEFT  CLAREON VIVITY TORIC LENS 2.60 00:26.2 (Left)  Patient Location: PACU  Anesthesia Type:MAC  Level of Consciousness: awake, alert , and oriented  Airway & Oxygen Therapy: Patient Spontanous Breathing  Post-op Assessment: Report given to RN and Post -op Vital signs reviewed and stable  Post vital signs: Reviewed and stable  Last Vitals:  Vitals Value Taken Time  BP 129/69 02/11/23 1155  Temp 36.3 C 02/11/23 1155  Pulse 62 02/11/23 1156  Resp 10 02/11/23 1156  SpO2 95 % 02/11/23 1156  Vitals shown include unfiled device data.  Last Pain:  Vitals:   02/11/23 1155  TempSrc:   PainSc: 0-No pain      Patients Stated Pain Goal: 0 (02/11/23 1027)  Complications: No notable events documented.

## 2023-02-11 NOTE — Anesthesia Preprocedure Evaluation (Addendum)
Anesthesia Evaluation  Patient identified by MRN, date of birth, ID band Patient awake    Reviewed: Allergy & Precautions, H&P , NPO status , Patient's Chart, lab work & pertinent test results  History of Anesthesia Complications (+) history of anesthetic complications  Airway Mallampati: IV  TM Distance: <3 FB Neck ROM: Full   Comment: 1 FB TMD, likely very anterior airway  Dental no notable dental hx.    Pulmonary neg pulmonary ROS, sleep apnea    Pulmonary exam normal breath sounds clear to auscultation       Cardiovascular hypertension, Normal cardiovascular exam Rhythm:Regular Rate:Normal     Neuro/Psych   Anxiety      Neuromuscular disease negative neurological ROS  negative psych ROS   GI/Hepatic negative GI ROS, Neg liver ROS,,,  Endo/Other  negative endocrine ROSHypothyroidism    Renal/GU negative Renal ROS  negative genitourinary   Musculoskeletal negative musculoskeletal ROS (+)    Abdominal   Peds negative pediatric ROS (+)  Hematology negative hematology ROS (+)   Anesthesia Other Findings OSA on CPAP Carotid artery stenosis Diverticulosis Hypertension Hyperlipidemia  Right leg DVT (HCC) Factor 5 Leiden mutation, heterozygous History of blood transfusion Thyroid disease  Radiculopathy Basal cell carcinoma  Fatty liver UTI (urinary tract infection)  Heterozygous factor V Leiden mutation (HCC) Cataract of left eye  Chronic cough History of skin cancer Hyponatremia Leukocytosis  Complication of anesthesia--slow to awaken    Reproductive/Obstetrics negative OB ROS                              Anesthesia Physical Anesthesia Plan  ASA: 3  Anesthesia Plan: MAC   Post-op Pain Management:    Induction: Intravenous  PONV Risk Score and Plan:   Airway Management Planned: Natural Airway and Nasal Cannula  Additional Equipment:   Intra-op Plan:    Post-operative Plan:   Informed Consent: I have reviewed the patients History and Physical, chart, labs and discussed the procedure including the risks, benefits and alternatives for the proposed anesthesia with the patient or authorized representative who has indicated his/her understanding and acceptance.     Dental Advisory Given  Plan Discussed with: Anesthesiologist, CRNA and Surgeon  Anesthesia Plan Comments: (Patient consented for risks of anesthesia including but not limited to:  - adverse reactions to medications - damage to eyes, teeth, lips or other oral mucosa - nerve damage due to positioning  - sore throat or hoarseness - Damage to heart, brain, nerves, lungs, other parts of body or loss of life  Patient voiced understanding.)         Anesthesia Quick Evaluation

## 2023-02-11 NOTE — Anesthesia Postprocedure Evaluation (Signed)
Anesthesia Post Note  Patient: Tonya Green  Procedure(s) Performed: CATARACT EXTRACTION PHACO AND INTRAOCULAR LENS PLACEMENT (IOC) LEFT  CLAREON VIVITY TORIC LENS 2.60 00:26.2 (Left)  Patient location during evaluation: PACU Anesthesia Type: MAC Level of consciousness: awake and alert Pain management: pain level controlled Vital Signs Assessment: post-procedure vital signs reviewed and stable Respiratory status: spontaneous breathing, nonlabored ventilation, respiratory function stable and patient connected to nasal cannula oxygen Cardiovascular status: stable and blood pressure returned to baseline Postop Assessment: no apparent nausea or vomiting Anesthetic complications: no   No notable events documented.   Last Vitals:  Vitals:   02/11/23 1155 02/11/23 1200  BP: 129/69 138/69  Pulse: 63 63  Resp: 12 12  Temp: (!) 36.3 C (!) 36.3 C  SpO2: 95% 94%    Last Pain:  Vitals:   02/11/23 1200  TempSrc:   PainSc: 0-No pain                 Eldean Nanna C Courtny Bennison

## 2023-02-11 NOTE — H&P (Signed)
[facility]   Primary Care Physician:  Dana Allan, MD Ophthalmologist: Dr. Willey Blade  Pre-Procedure History & Physical: HPI:  Tonya Green is a 72 y.o. female here for cataract surgery.   Past Medical History:  Diagnosis Date   Basal cell carcinoma    left nasal sidewall [facility]   Carotid artery stenosis    mild L>R 03/26/16 [place]    Cataract of left eye 05/25/2021   Chronic cough 04/03/2022   Complication of anesthesia    slow to wake   Diverticulosis    colonoscopy 10/13/16 [place]   Factor 5 Leiden mutation, heterozygous (HCC)    Fatty liver    Heterozygous factor V Leiden mutation (HCC) 11/01/2017   History of blood transfusion    birth Rh factor    History of skin cancer 11/01/2017   H/o BCC forehead and SCC nose    Hyperlipidemia    Hypertension    Hyponatremia 12/18/2017   Leukocytosis 12/18/2017   OSA on CPAP    Radiculopathy    Right leg DVT (HCC)    2010 s/p back surgery was on Coumadin x 6 months    Thyroid disease    hypothyroidism    UTI (urinary tract infection) 08/21/2018    Past Surgical History:  Procedure Laterality Date   APPENDECTOMY     2000   BACK SURGERY     BREAST EXCISIONAL BIOPSY Left 2005   for infection   BREAST SURGERY     biospy ? year    COLONOSCOPY     COLONOSCOPY WITH PROPOFOL N/A 01/01/2022   Procedure: COLONOSCOPY WITH PROPOFOL;  Surgeon: Regis Bill, MD;  Location: [facility] ENDOSCOPY;  Service: Endoscopy;  Laterality: N/A;   MOHS SURGERY  05/11/2019   left nasal side wall bcc Dr. Jeannine Boga in [place]    ruptured disc repair     L4/5 10/2008 with complications residual right leg weakness/reduced sensation and DVT in 2010 post surgery     Prior to Admission medications   Medication Sig Start Date End Date Taking? Authorizing Provider  amLODipine (NORVASC) 2.5 MG tablet Take 1 tablet (2.5 mg total) by mouth daily. In am if BP>140/?90 take another tablet 12/15/21  Yes McLean-Scocuzza,  Pasty Spillers, MD  Ascorbic Acid (VITAMIN C) 1000 MG tablet Take 1,000 mg by mouth 3 (three) times daily.   Yes [provider]  b complex vitamins capsule Take 1 capsule by mouth daily. B-12, b-6, folate   Yes [provider]  Cholecalciferol (VITAMIN D3) 5000 units CAPS Take by mouth.   Yes [provider]  Coenzyme Q10-Fish Oil-Vit E (CO-Q 10 OMEGA-3 FISH OIL PO) Take by mouth in the morning and at bedtime.   Yes [provider]  diazepam (VALIUM) 2 MG tablet TAKE 1/2 TO 1 TABLET BY MOUTH EVERY NIGHT AT BEDTIME AS NEEDED 06/04/22  Yes Dana Allan, MD  gabapentin (NEURONTIN) 100 MG capsule TAKE 3 CAPSULES(300 MG) BY MOUTH THREE TIMES DAILY 06/04/22  Yes Dana Allan, MD  HYDROcodone-acetaminophen (NORCO/VICODIN) 5-325 MG tablet Take 1 tablet by mouth 2 (two) times daily as needed (only for flare ups). 01/30/23  Yes Dana Allan, MD  hydrocortisone 2.5 % ointment Apply topically 2 (two) times daily. Apply to affected area two times a day as needed for 5 days 10/01/22  Yes Dana Allan, MD  levothyroxine (SYNTHROID) 50 MCG tablet Take 1 tablet (50 mcg total) by mouth daily before breakfast. 01/30/23  Yes Clent Ridges,  Kenney Houseman, MD  MAGNESIUM MALATE PO Take 375 mg by mouth.   Yes [provider]  Multiple Vitamins-Minerals (PRESERVISION AREDS PO) Take by mouth daily.   Yes [provider]  Nattokinase 100 MG CAPS Take by mouth daily. 2000 iu   Yes [provider]  OVER THE COUNTER MEDICATION Take 1,500 mg by mouth 3 (three) times daily. [place]   Yes [provider]  Strontium Chloride POWD Use as directed 250 mg in the mouth or throat.   Yes [provider]  TURMERIC PO Take 2,250 mg by mouth daily.   Yes [provider]  fexofenadine (ALLEGRA ALLERGY) 180 MG tablet Take 1 tablet (180 mg total) by mouth daily. Patient not taking: Reported on 01/31/2023 10/01/22   Dana Allan, MD  fluticasone [facility]) 50 MCG/ACT nasal spray Place 2  sprays into both nostrils daily. Patient not taking: Reported on 01/31/2023 10/01/22   Dana Allan, MD    Allergies as of 01/21/2023 - Review Complete 12/27/2022  Allergen Reaction Noted   Macrobid [nitrofurantoin macrocrystal]  11/12/2017   Statins Other (See Comments) 12/27/2022   Sulfa antibiotics  11/04/2017    Family History  Problem Relation Age of Onset   Hypertension Mother    Stroke Mother        in 37s    Heart disease Father    Stroke Father    Stroke Brother    Esophageal cancer Brother    Liver cancer Paternal Aunt    Stroke Paternal Grandfather    Other Daughter        chronic back pain nerve sheath abnormal in spine s/p surgery   Hyperlipidemia Other     Social History   Socioeconomic History   Marital status: Married    Spouse name: Not on file   Number of children: 2   Years of education: Not on file   Highest education level: Not on file  Occupational History   Occupation: Retired  Tobacco Use   Smoking status: Never   Smokeless tobacco: Never  Vaping Use   Vaping status: Never Used  Substance and Sexual Activity   Alcohol use: Yes    Comment: occ   Drug use: Yes    Types: Hydrocodone    Comment: PRN for pain flare ups   Sexual activity: Not on file  Other Topics Concern   Not on file  Social History Narrative   BSN CCM, RN   Moved from [place] recently    2 daughters    Retired and married    No guns, wears seat belt, safe in relationship    Lives at [employer]    Social Determinants of Health   Financial Resource Strain: Low Risk  (10/17/2022)   Overall Financial Resource Strain (CARDIA)    Difficulty of Paying Living Expenses: Not hard at all  Food Insecurity: No Food Insecurity (10/30/2022)   Hunger Vital Sign    Worried About Running Out of Food in the Last Year: Never true    Ran Out of Food in the Last Year: Never true  Transportation Needs: No Transportation Needs (10/30/2022)   PRAPARE - Scientist, research (physical sciences) (Medical): No    Lack of Transportation (Non-Medical): No  Physical Activity: Sufficiently Active (10/17/2022)   Exercise Vital Sign    Days of Exercise per Week: 5 days    Minutes of Exercise per Session: 30 min  Stress: No Stress Concern Present (10/17/2022)   [employer]  of Occupational Health - Occupational Stress Questionnaire    Feeling of Stress : Not at all  Social Connections: Moderately Integrated (10/17/2022)   Social Connection and Isolation Panel [NHANES]    Frequency of Communication with Friends and Family: More than three times a week    Frequency of Social Gatherings with Friends and Family: More than three times a week    Attends Religious Services: Never    Database administrator or Organizations: Yes    Attends Engineer, structural: More than 4 times per year    Marital Status: Married  Catering manager Violence: Not At Risk (10/30/2022)   Humiliation, Afraid, Rape, and Kick questionnaire    Fear of Current or Ex-Partner: No    Emotionally Abused: No    Physically Abused: No    Sexually Abused: No    Review of Systems: See HPI, otherwise negative ROS  Physical Exam: BP (!) 158/77   Pulse 67   Temp 98.1 F (36.7 C) (Temporal)   Resp 10   Ht 5\' 5"  (1.651 m)   Wt 82.1 kg   SpO2 95%   BMI 30.12 kg/m  General:   Alert, cooperative in NAD Head:  Normocephalic and atraumatic. Respiratory:  Normal work of breathing. Cardiovascular:  RRR  Impression/Plan: Tonya Green is here for cataract surgery.  Risks, benefits, limitations, and alternatives regarding cataract surgery have been reviewed with the patient.  Questions have been answered.  All parties agreeable.   Willey Blade, MD  02/11/2023, 11:23 AM

## 2023-02-11 NOTE — Op Note (Signed)
OPERATIVE NOTE  Tonya Green [id number] 02/11/2023   PREOPERATIVE DIAGNOSIS:  Nuclear sclerotic cataract left eye.  H25.12   POSTOPERATIVE DIAGNOSIS:    Nuclear sclerotic cataract left eye.     PROCEDURE:  Phacoemusification with posterior chamber intraocular lens placement of the left eye   LENS:   Implant Name Type Inv. Item Serial No. Manufacturer Lot No. LRB No. Used Action  LENS CLAREON VIVITY TORIC 230 - [id number]  LENS CLAREON VIVITY TORIC 230 [id number] SIGHTPATH  Left 1 Implanted      Procedure(s): CATARACT EXTRACTION PHACO AND INTRAOCULAR LENS PLACEMENT (IOC) LEFT  CLAREON VIVITY TORIC LENS 2.60 00:26.2 (Left)  SURGEON:  Willey Blade, MD, MPH   ANESTHESIA:  Topical with tetracaine drops augmented with 1% preservative-free intracameral lidocaine.  ESTIMATED BLOOD LOSS: <1 mL   COMPLICATIONS:  None.   DESCRIPTION OF PROCEDURE:  The patient was identified in the holding room and transported to the operating room and placed in the supine position under the operating microscope.  The left eye was identified as the operative eye and it was prepped and draped in the usual sterile ophthalmic fashion.  The verion system was registered without difficulty.   A 1.0 millimeter clear-corneal paracentesis was made at the 5:00 position. 0.5 ml of preservative-free 1% lidocaine with epinephrine was injected into the anterior chamber.  The anterior chamber was filled with viscoelastic.  A 2.4 millimeter keratome was used to make a near-clear corneal incision at the 2:00 position.  A curvilinear capsulorrhexis was made with a cystotome and capsulorrhexis forceps.  Balanced salt solution was used to hydrodissect and hydrodelineate the nucleus.   Phacoemulsification was then used in stop and chop fashion to remove the lens nucleus and epinucleus.  The remaining cortex was then removed using the irrigation and aspiration handpiece. Viscoelastic was then placed into the capsular bag to distend it for  lens placement.  A lens was then injected into the capsular bag.  The remaining viscoelastic was aspirated.  The lens was rotated with guidance from the verion system.   Wounds were hydrated with balanced salt solution.  The anterior chamber was inflated to a physiologic pressure with balanced salt solution.  Intracameral vigamox 0.1 mL undiltued was injected into the eye and a drop placed onto the ocular surface.  No wound leaks were noted.  The patient was taken to the recovery room in stable condition without complications of anesthesia or surgery  Willey Blade 02/11/2023, 11:52 AM

## 2023-02-15 NOTE — Anesthesia Preprocedure Evaluation (Addendum)
Anesthesia Evaluation  Patient identified by MRN, date of birth, ID band Patient awake    Reviewed: Allergy & Precautions, H&P , NPO status , Patient's Chart, lab work & pertinent test results  History of Anesthesia Complications (+) history of anesthetic complications  Airway Mallampati: IV  TM Distance: <3 FB Neck ROM: Full   Comment: 1 FB TMD, likely very anterior airway  Dental no notable dental hx.    Pulmonary sleep apnea    Pulmonary exam normal breath sounds clear to auscultation       Cardiovascular hypertension, Normal cardiovascular exam Rhythm:Regular Rate:Normal     Neuro/Psych   Anxiety      Neuromuscular disease negative neurological ROS  negative psych ROS   GI/Hepatic negative GI ROS, Neg liver ROS,,,  Endo/Other  Hypothyroidism    Renal/GU negative Renal ROS  negative genitourinary   Musculoskeletal negative musculoskeletal ROS (+)    Abdominal   Peds negative pediatric ROS (+)  Hematology negative hematology ROS (+)   Anesthesia Other Findings OSA on CPAP  Carotid artery stenosis Diverticulosis Hypertension Hyperlipidemia  Right leg DVT (HCC) Factor 5 Leiden mutation, heterozygous (HCC) History of blood transfusion Thyroid disease  Radiculopathy Basal cell carcinoma  Fatty liver UTI (urinary tract infection)  Heterozygous factor V Leiden mutation (HCC) Cataract of left eye  Chronic cough History of skin cancer Hyponatremia Leukocytosis  Complication of anesthesia--slow to awaken  Previous cataract surgery 02-11-23   Reproductive/Obstetrics negative OB ROS                              Anesthesia Physical Anesthesia Plan  ASA: 3  Anesthesia Plan: MAC   Post-op Pain Management:    Induction: Intravenous  PONV Risk Score and Plan:   Airway Management Planned: Natural Airway and Nasal Cannula  Additional Equipment:   Intra-op Plan:    Post-operative Plan:   Informed Consent: I have reviewed the patients History and Physical, chart, labs and discussed the procedure including the risks, benefits and alternatives for the proposed anesthesia with the patient or authorized representative who has indicated his/her understanding and acceptance.     Dental Advisory Given  Plan Discussed with: Anesthesiologist, CRNA and Surgeon  Anesthesia Plan Comments: (Patient consented for risks of anesthesia including but not limited to:  - adverse reactions to medications - damage to eyes, teeth, lips or other oral mucosa - nerve damage due to positioning  - sore throat or hoarseness - Damage to heart, brain, nerves, lungs, other parts of body or loss of life  Patient voiced understanding.)         Anesthesia Quick Evaluation

## 2023-02-20 NOTE — Discharge Instructions (Signed)

## 2023-02-28 NOTE — Telephone Encounter (Signed)
Called and left a message to the village at [facility] to give me a call back with the information of the name and date of the vaccines.

## 2023-03-01 NOTE — Telephone Encounter (Signed)
Called and left a message to the village at [facility] to give me a call back with the information of the name and date of the vaccines.

## 2023-03-06 NOTE — Telephone Encounter (Signed)
Left message to return call to our office.  Need to get the name and dates of the vaccines.

## 2023-03-06 NOTE — Telephone Encounter (Signed)
Chart updated with vaccines.

## 2023-03-22 ENCOUNTER — Other Ambulatory Visit: Payer: Self-pay | Admitting: Neurological Surgery

## 2023-03-22 DIAGNOSIS — M5416 Radiculopathy, lumbar region: Secondary | ICD-10-CM

## 2023-03-25 ENCOUNTER — Ambulatory Visit
Admission: RE | Admit: 2023-03-25 | Discharge: 2023-03-25 | Disposition: A | Discharge status: Home / Self Care | Payer: Medicare Other | Attending: Ophthalmology | Admitting: Ophthalmology

## 2023-03-25 ENCOUNTER — Ambulatory Visit: Payer: Medicare Other | Admitting: Anesthesiology

## 2023-03-25 ENCOUNTER — Encounter: Payer: Self-pay | Admitting: Ophthalmology

## 2023-03-25 ENCOUNTER — Encounter
Admission: RE | Disposition: A | Discharge status: Home / Self Care | Payer: Self-pay | Source: Home / Self Care | Attending: Ophthalmology

## 2023-03-25 ENCOUNTER — Other Ambulatory Visit: Payer: Self-pay

## 2023-03-25 DIAGNOSIS — I10 Essential (primary) hypertension: Secondary | ICD-10-CM | POA: Insufficient documentation

## 2023-03-25 DIAGNOSIS — Z86718 Personal history of other venous thrombosis and embolism: Secondary | ICD-10-CM | POA: Insufficient documentation

## 2023-03-25 DIAGNOSIS — H2511 Age-related nuclear cataract, right eye: Secondary | ICD-10-CM | POA: Diagnosis present

## 2023-03-25 DIAGNOSIS — E785 Hyperlipidemia, unspecified: Secondary | ICD-10-CM | POA: Diagnosis not present

## 2023-03-25 DIAGNOSIS — E039 Hypothyroidism, unspecified: Secondary | ICD-10-CM | POA: Diagnosis not present

## 2023-03-25 DIAGNOSIS — D6851 Activated protein C resistance: Secondary | ICD-10-CM | POA: Diagnosis not present

## 2023-03-25 DIAGNOSIS — G4733 Obstructive sleep apnea (adult) (pediatric): Secondary | ICD-10-CM | POA: Insufficient documentation

## 2023-03-25 HISTORY — PX: CATARACT EXTRACTION W/PHACO: SHX586

## 2023-03-25 SURGERY — PHACOEMULSIFICATION, CATARACT, WITH IOL INSERTION
Anesthesia: Monitor Anesthesia Care | Site: Eye | Laterality: Right

## 2023-03-25 MED ORDER — FENTANYL CITRATE (PF) 100 MCG/2ML IJ SOLN
INTRAMUSCULAR | Status: AC
  Filled 2023-03-25: qty 2

## 2023-03-25 MED ORDER — FENTANYL CITRATE (PF) 100 MCG/2ML IJ SOLN
INTRAMUSCULAR | Status: DC | PRN
  Administered 2023-03-25: 50 ug via INTRAVENOUS

## 2023-03-25 MED ORDER — SIGHTPATH DOSE#1 NA HYALUR & NA CHOND-NA HYALUR IO KIT
PACK | INTRAOCULAR | Status: DC | PRN
  Administered 2023-03-25: 1 via OPHTHALMIC

## 2023-03-25 MED ORDER — TETRACAINE HCL 0.5 % OP SOLN
1.0000 [drp] | OPHTHALMIC | Status: DC | PRN
  Administered 2023-03-25 (×3): 1 [drp] via OPHTHALMIC

## 2023-03-25 MED ORDER — LIDOCAINE HCL (PF) 2 % IJ SOLN
INTRAOCULAR | Status: DC | PRN
  Administered 2023-03-25: 1 mL via INTRAOCULAR

## 2023-03-25 MED ORDER — MIDAZOLAM HCL 2 MG/2ML IJ SOLN
INTRAMUSCULAR | Status: DC | PRN
  Administered 2023-03-25: 1 mg via INTRAVENOUS

## 2023-03-25 MED ORDER — MIDAZOLAM HCL 2 MG/2ML IJ SOLN
INTRAMUSCULAR | Status: AC
  Filled 2023-03-25: qty 2

## 2023-03-25 MED ORDER — TETRACAINE HCL 0.5 % OP SOLN
OPHTHALMIC | Status: AC
Start: 2023-03-25 — End: ?
  Filled 2023-03-25: qty 4

## 2023-03-25 MED ORDER — SODIUM CHLORIDE 0.9% FLUSH: 10.0000 mL | INTRAVENOUS | Status: DC | PRN

## 2023-03-25 MED ORDER — MOXIFLOXACIN HCL 0.5 % OP SOLN
OPHTHALMIC | Status: DC | PRN
  Administered 2023-03-25: .2 mL via OPHTHALMIC

## 2023-03-25 MED ORDER — LACTATED RINGERS IV SOLN: INTRAVENOUS | Status: DC

## 2023-03-25 MED ORDER — ARMC OPHTHALMIC DILATING DROPS
1.0000 | OPHTHALMIC | Status: DC | PRN
  Administered 2023-03-25 (×3): 1 via OPHTHALMIC

## 2023-03-25 MED ORDER — SIGHTPATH DOSE#1 BSS IO SOLN
INTRAOCULAR | Status: DC | PRN
  Administered 2023-03-25: 124 mL via OPHTHALMIC

## 2023-03-25 MED ORDER — SIGHTPATH DOSE#1 BSS IO SOLN
INTRAOCULAR | Status: DC | PRN
  Administered 2023-03-25: 15 mL

## 2023-03-25 MED ORDER — ARMC OPHTHALMIC DILATING DROPS
OPHTHALMIC | Status: AC
  Filled 2023-03-25: qty 0.5

## 2023-03-25 SURGICAL SUPPLY — 18 items
CANNULA ANT/CHMB 27G (MISCELLANEOUS) IMPLANT
CANNULA ANT/CHMB 27GA (MISCELLANEOUS)
CATARACT SUITE SIGHTPATH (MISCELLANEOUS) ×1
DISSECTOR HYDRO NUCLEUS 50X22 (MISCELLANEOUS) ×1 IMPLANT
FEE CATARACT SUITE SIGHTPATH (MISCELLANEOUS) ×1 IMPLANT
GLOVE SURG GAMMEX PI TX LF 7.5 (GLOVE) ×1 IMPLANT
GLOVE SURG SYN 8.5 E (GLOVE) ×1
GLOVE SURG SYN 8.5 PF PI (GLOVE) ×1 IMPLANT
LENS CLAREON VIVITY TORIC ×1 IMPLANT
LENS IOL CLRN VT TRC 3 22.5 IMPLANT
NDL FILTER BLUNT 18X1 1/2 (NEEDLE) ×1 IMPLANT
NEEDLE FILTER BLUNT 18X1 1/2 (NEEDLE) ×1
PACK VIT ANT 23G (MISCELLANEOUS) IMPLANT
RING MALYGIN (MISCELLANEOUS) IMPLANT
SUT ETHILON 10-0 CS-B-6CS-B-6 (SUTURE)
SUTURE EHLN 10-0 CS-B-6CS-B-6 (SUTURE) IMPLANT
SYR 3ML LL SCALE MARK (SYRINGE) ×1 IMPLANT
SYR 5ML LL (SYRINGE) ×1 IMPLANT

## 2023-03-25 NOTE — H&P (Signed)
[facility]   Primary Care Physician:  Dana Allan, MD Ophthalmologist: Dr. Willey Blade  Pre-Procedure History & Physical: HPI:  Tonya Green is a 72 y.o. female here for cataract surgery.   Past Medical History:  Diagnosis Date   Basal cell carcinoma    left nasal sidewall [facility] Dr[facility] Jeannine Boga   Carotid artery stenosis    mild L>R 03/26/16 [place]    Cataract of left eye 05/25/2021   Chronic cough 04/03/2022   Complication of anesthesia    slow to wake   Diverticulosis    colonoscopy 10/13/16 [place]   Factor 5 Leiden mutation, heterozygous (HCC)    Fatty liver    Heterozygous factor V Leiden mutation (HCC) 11/01/2017   History of blood transfusion    birth Rh factor    History of skin cancer 11/01/2017   H/o BCC forehead and SCC nose    Hyperlipidemia    Hypertension    Hyponatremia 12/18/2017   Leukocytosis 12/18/2017   OSA on CPAP    Radiculopathy    Right leg DVT (HCC)    2010 s/p back surgery was on Coumadin x 6 months    Thyroid disease    hypothyroidism    UTI (urinary tract infection) 08/21/2018    Past Surgical History:  Procedure Laterality Date   APPENDECTOMY     2000   BACK SURGERY     BREAST EXCISIONAL BIOPSY Left 2005   for infection   BREAST SURGERY     biospy ? year    CATARACT EXTRACTION W/PHACO Left 02/11/2023   Procedure: CATARACT EXTRACTION PHACO AND INTRAOCULAR LENS PLACEMENT (IOC) LEFT  CLAREON VIVITY TORIC LENS 2.60 00:26.2;  Surgeon: Nevada Crane, MD;  Location: [facility];  Service: Ophthalmology;  Laterality: Left;   COLONOSCOPY     COLONOSCOPY WITH PROPOFOL N/A 01/01/2022   Procedure: COLONOSCOPY WITH PROPOFOL;  Surgeon: Regis Bill, MD;  Location: [facility];  Service: Endoscopy;  Laterality: N/A;   MOHS SURGERY  05/11/2019   left nasal side wall bcc Dr. Jeannine Boga in [place]    ruptured disc repair     L4/5 10/2008 with complications residual right leg weakness/reduced sensation and  DVT in 2010 post surgery     Prior to Admission medications   Medication Sig Start Date End Date Taking? Authorizing Provider  amLODipine (NORVASC) 2.5 MG tablet Take 1 tablet (2.5 mg total) by mouth daily. In am if BP>140/?90 take another tablet 12/15/21  Yes McLean-Scocuzza, Pasty Spillers, MD  Ascorbic Acid (VITAMIN C) 1000 MG tablet Take 1,000 mg by mouth 3 (three) times daily.   Yes [provider]  b complex vitamins capsule Take 1 capsule by mouth daily. B-12, b-6, folate   Yes [provider]  Cholecalciferol (VITAMIN D3) 5000 units CAPS Take by mouth.   Yes [provider]  Coenzyme Q10-Fish Oil-Vit E (CO-Q 10 OMEGA-3 FISH OIL PO) Take by mouth in the morning and at bedtime.   Yes [provider]  diazepam (VALIUM) 2 MG tablet TAKE 1/2 TO 1 TABLET BY MOUTH EVERY NIGHT AT BEDTIME AS NEEDED 06/04/22  Yes Dana Allan, MD  gabapentin (NEURONTIN) 100 MG capsule TAKE 3 CAPSULES(300 MG) BY MOUTH THREE TIMES DAILY 06/04/22  Yes Dana Allan, MD  HYDROcodone-acetaminophen (NORCO/VICODIN) 5-325 MG tablet Take 1 tablet by mouth 2 (two) times daily as needed (only for flare ups). 01/30/23  Yes Dana Allan, MD  hydrocortisone 2.5 % ointment Apply topically 2 (  two) times daily. Apply to affected area two times a day as needed for 5 days 10/01/22  Yes Dana Allan, MD  levothyroxine (SYNTHROID) 50 MCG tablet Take 1 tablet (50 mcg total) by mouth daily before breakfast. 01/30/23  Yes Dana Allan, MD  MAGNESIUM MALATE PO Take 375 mg by mouth.   Yes [provider]  Multiple Vitamins-Minerals (PRESERVISION AREDS PO) Take by mouth daily.   Yes [provider]  Nattokinase 100 MG CAPS Take by mouth daily. 2000 iu   Yes [provider]  OVER THE COUNTER MEDICATION Take 1,500 mg by mouth 3 (three) times daily. [place]   Yes [provider]  Strontium Chloride POWD Use as directed 250 mg in the mouth or throat.   Yes [provider]  TURMERIC PO  Take 2,250 mg by mouth daily.   Yes [provider]  fexofenadine (ALLEGRA ALLERGY) 180 MG tablet Take 1 tablet (180 mg total) by mouth daily. Patient not taking: Reported on 01/31/2023 10/01/22   Dana Allan, MD  fluticasone [facility]) 50 MCG/ACT nasal spray Place 2 sprays into both nostrils daily. Patient not taking: Reported on 01/31/2023 10/01/22   Dana Allan, MD    Allergies as of 01/21/2023 - Review Complete 12/27/2022  Allergen Reaction Noted   Macrobid [nitrofurantoin macrocrystal]  11/12/2017   Statins Other (See Comments) 12/27/2022   Sulfa antibiotics  11/04/2017    Family History  Problem Relation Age of Onset   Hypertension Mother    Stroke Mother        in 9s    Heart disease Father    Stroke Father    Stroke Brother    Esophageal cancer Brother    Liver cancer Paternal Aunt    Stroke Paternal Grandfather    Other Daughter        chronic back pain nerve sheath abnormal in spine s/p surgery   Hyperlipidemia Other     Social History   Socioeconomic History   Marital status: Married    Spouse name: Not on file   Number of children: 2   Years of education: Not on file   Highest education level: Not on file  Occupational History   Occupation: Retired  Tobacco Use   Smoking status: Never   Smokeless tobacco: Never  Vaping Use   Vaping status: Never Used  Substance and Sexual Activity   Alcohol use: Yes    Comment: occ   Drug use: Yes    Types: Hydrocodone    Comment: PRN for pain flare ups   Sexual activity: Not on file  Other Topics Concern   Not on file  Social History Narrative   BSN CCM, RN   Moved from [place] recently    2 daughters    Retired and married    No guns, wears seat belt, safe in relationship    Lives at [employer]    Social Determinants of Health   Financial Resource Strain: Low Risk  (10/17/2022)   Overall Financial Resource Strain (CARDIA)    Difficulty of Paying Living Expenses: Not hard at all  Food Insecurity: No  Food Insecurity (10/30/2022)   Hunger Vital Sign    Worried About Running Out of Food in the Last Year: Never true    Ran Out of Food in the Last Year: Never true  Transportation Needs: No Transportation Needs (10/30/2022)   PRAPARE - Administrator, Civil Service (Medical): No    Lack of Transportation (Non-Medical):  No  Physical Activity: Sufficiently Active (10/17/2022)   Exercise Vital Sign    Days of Exercise per Week: 5 days    Minutes of Exercise per Session: 30 min  Stress: No Stress Concern Present (10/17/2022)   [employer] of Occupational Health - Occupational Stress Questionnaire    Feeling of Stress : Not at all  Social Connections: Moderately Integrated (10/17/2022)   Social Connection and Isolation Panel [NHANES]    Frequency of Communication with Friends and Family: More than three times a week    Frequency of Social Gatherings with Friends and Family: More than three times a week    Attends Religious Services: Never    Database administrator or Organizations: Yes    Attends Engineer, structural: More than 4 times per year    Marital Status: Married  Catering manager Violence: Not At Risk (10/30/2022)   Humiliation, Afraid, Rape, and Kick questionnaire    Fear of Current or Ex-Partner: No    Emotionally Abused: No    Physically Abused: No    Sexually Abused: No    Review of Systems: See HPI, otherwise negative ROS  Physical Exam: BP (!) 152/73   Pulse 65   Temp (!) 97.2 F (36.2 C) (Temporal)   Resp (!) 9   Ht 5\' 5"  (1.651 m)   Wt 83.5 kg   SpO2 97%   BMI 30.62 kg/m  General:   Alert, cooperative in NAD Head:  Normocephalic and atraumatic. Respiratory:  Normal work of breathing. Cardiovascular:  RRR  Impression/Plan: Tonya Green is here for cataract surgery.  Risks, benefits, limitations, and alternatives regarding cataract surgery have been reviewed with the patient.  Questions have been answered.  All parties  agreeable.   Willey Blade, MD  03/25/2023, 11:06 AM

## 2023-03-25 NOTE — Op Note (Signed)
OPERATIVE NOTE  Tonya Green [id number] 03/25/2023   PREOPERATIVE DIAGNOSIS:  Nuclear sclerotic cataract right eye.  H25.11   POSTOPERATIVE DIAGNOSIS:    Nuclear sclerotic cataract right eye.     PROCEDURE:  Phacoemusification with posterior chamber intraocular lens placement of the right eye   LENS:   Implant Name Type Inv. Item Serial No. Manufacturer Lot No. LRB No. Used Action  LENS Rachelle Hora - [id number]  LENS Rachelle Hora [id number] [employer]  Right 1 Implanted       Procedure(s) with comments: CATARACT EXTRACTION PHACO AND INTRAOCULAR LENS PLACEMENT (IOC) RIGHT  CLAREON VIVITY TORIC LENS (Right) - 3.34 0:33.7  SURGEON:  Willey Blade, MD, MPH  ANESTHESIOLOGIST: Anesthesiologist: Marisue Humble, MD CRNA: Lanell Matar, CRNA; Barbette Hair, CRNA   ANESTHESIA:  Topical with tetracaine drops augmented with 1% preservative-free intracameral lidocaine.  ESTIMATED BLOOD LOSS: less than 1 mL.   COMPLICATIONS:  None.   DESCRIPTION OF PROCEDURE:  The patient was identified in the holding room and transported to the operating room and placed in the supine position under the operating microscope.  The right eye was identified as the operative eye and it was prepped and draped in the usual sterile ophthalmic fashion.  The verion system was registered without difficulty.   A 1.0 millimeter clear-corneal paracentesis was made at the 10:30 position. 0.5 ml of preservative-free 1% lidocaine with epinephrine was injected into the anterior chamber.  The anterior chamber was filled with viscoelastic.  A 2.4 millimeter keratome was used to make a near-clear corneal incision at the 8:00 position.  A curvilinear capsulorrhexis was made with a cystotome and capsulorrhexis forceps.  Balanced salt solution was used to hydrodissect and hydrodelineate the nucleus.   Phacoemulsification was then used in stop and chop fashion to remove the lens nucleus and epinucleus.  The  remaining cortex was then removed using the irrigation and aspiration handpiece. Viscoelastic was then placed into the capsular bag to distend it for lens placement.  A lens was then injected into the capsular bag.  The remaining viscoelastic was aspirated.  The lens was rotated with guidance from the verion system.   Wounds were hydrated with balanced salt solution.  The anterior chamber was inflated to a physiologic pressure with balanced salt solution. The lens was well centered.  Intracameral vigamox 0.1 mL undiluted was injected into the eye and a drop placed onto the ocular surface.  No wound leaks were noted.  The patient was taken to the recovery room in stable condition without complications of anesthesia or surgery  Willey Blade 03/25/2023, 11:39 AM

## 2023-03-25 NOTE — Transfer of Care (Signed)
Immediate Anesthesia Transfer of Care Note  Patient: Tonya Green  Procedure(s) Performed: CATARACT EXTRACTION PHACO AND INTRAOCULAR LENS PLACEMENT (IOC) RIGHT  CLAREON VIVITY TORIC LENS (Right: Eye)  Patient Location: PACU  Anesthesia Type:MAC  Level of Consciousness: awake, drowsy, and patient cooperative  Airway & Oxygen Therapy: Patient Spontanous Breathing and Patient connected to nasal cannula oxygen  Post-op Assessment: Report given to RN, Post -op Vital signs reviewed and stable, and Patient moving all extremities X 4  Post vital signs: Reviewed and stable  Last Vitals:  Vitals Value Taken Time  BP 133/77 03/25/23 1145  Temp 36.8 C 03/25/23 1143  Pulse 66 03/25/23 1146  Resp 15 03/25/23 1146  SpO2 96 % 03/25/23 1146  Vitals shown include unfiled device data.  Last Pain:  Vitals:   03/25/23 1143  TempSrc:   PainSc: 0-No pain      Patients Stated Pain Goal: 0 (03/25/23 1028)  Complications: No notable events documented.

## 2023-03-25 NOTE — Anesthesia Postprocedure Evaluation (Signed)
Anesthesia Post Note  Patient: Elisandra Zetts  Procedure(s) Performed: CATARACT EXTRACTION PHACO AND INTRAOCULAR LENS PLACEMENT (IOC) RIGHT  CLAREON VIVITY TORIC LENS (Right: Eye)  Patient location during evaluation: PACU Anesthesia Type: MAC Level of consciousness: awake and alert Pain management: pain level controlled Vital Signs Assessment: post-procedure vital signs reviewed and stable Respiratory status: spontaneous breathing, nonlabored ventilation, respiratory function stable and patient connected to nasal cannula oxygen Cardiovascular status: stable and blood pressure returned to baseline Postop Assessment: no apparent nausea or vomiting Anesthetic complications: no   No notable events documented.   Last Vitals:  Vitals:   03/25/23 1143 03/25/23 1145  BP: 128/72 133/77  Pulse: 61 65  Resp: (!) 7 13  Temp: 36.8 C   SpO2: 96% 95%    Last Pain:  Vitals:   03/25/23 1145  TempSrc:   PainSc: 0-No pain                 Unnamed Hino C Somtochukwu Woollard

## 2023-03-26 ENCOUNTER — Encounter: Payer: Self-pay | Admitting: Ophthalmology

## 2023-03-29 ENCOUNTER — Ambulatory Visit: Payer: Medicare Other | Admitting: Cardiology

## 2023-04-03 ENCOUNTER — Ambulatory Visit
Admission: RE | Admit: 2023-04-03 | Discharge: 2023-04-03 | Disposition: A | Discharge status: Home / Self Care | Payer: Medicare Other | Source: Ambulatory Visit | Attending: Neurological Surgery | Admitting: Neurological Surgery

## 2023-04-03 DIAGNOSIS — M5416 Radiculopathy, lumbar region: Secondary | ICD-10-CM | POA: Diagnosis present

## 2023-04-03 MED ORDER — GADOBUTROL 1 MMOL/ML IV SOLN
7.5000 mL | Freq: Once | INTRAVENOUS | Status: AC | PRN
  Administered 2023-04-03: 7.5 mL via INTRAVENOUS

## 2023-05-07 ENCOUNTER — Encounter: Payer: Self-pay | Admitting: Physical Medicine and Rehabilitation

## 2023-05-15 ENCOUNTER — Encounter: Payer: Self-pay | Admitting: Family Medicine

## 2023-05-15 ENCOUNTER — Ambulatory Visit: Payer: Medicare Other | Admitting: Family Medicine

## 2023-05-15 VITALS — BP 142/84 | HR 68 | Temp 98.2°F | Ht 65.0 in | Wt 187.2 lb

## 2023-05-15 DIAGNOSIS — E039 Hypothyroidism, unspecified: Secondary | ICD-10-CM

## 2023-05-15 DIAGNOSIS — G8929 Other chronic pain: Secondary | ICD-10-CM

## 2023-05-15 DIAGNOSIS — R7309 Other abnormal glucose: Secondary | ICD-10-CM

## 2023-05-15 DIAGNOSIS — E785 Hyperlipidemia, unspecified: Secondary | ICD-10-CM | POA: Diagnosis not present

## 2023-05-15 DIAGNOSIS — E559 Vitamin D deficiency, unspecified: Secondary | ICD-10-CM

## 2023-05-15 DIAGNOSIS — R635 Abnormal weight gain: Secondary | ICD-10-CM

## 2023-05-15 DIAGNOSIS — Z66 Do not resuscitate: Secondary | ICD-10-CM

## 2023-05-15 DIAGNOSIS — E538 Deficiency of other specified B group vitamins: Secondary | ICD-10-CM | POA: Diagnosis not present

## 2023-05-15 DIAGNOSIS — M5416 Radiculopathy, lumbar region: Secondary | ICD-10-CM

## 2023-05-15 DIAGNOSIS — M5440 Lumbago with sciatica, unspecified side: Secondary | ICD-10-CM | POA: Diagnosis not present

## 2023-05-15 DIAGNOSIS — I10 Essential (primary) hypertension: Secondary | ICD-10-CM | POA: Diagnosis not present

## 2023-05-15 DIAGNOSIS — M810 Age-related osteoporosis without current pathological fracture: Secondary | ICD-10-CM

## 2023-05-15 LAB — LIPID PANEL
Cholesterol: 282 mg/dL — ABNORMAL HIGH (ref 0–200)
HDL: 53.2 mg/dL (ref >39.00)
LDL Cholesterol: 193 mg/dL — ABNORMAL HIGH (ref 0–99)
NonHDL: 229.26
Total CHOL/HDL Ratio: 5
Triglycerides: 179 mg/dL — ABNORMAL HIGH (ref 0.0–149.0)
VLDL: 35.8 mg/dL (ref 0.0–40.0)

## 2023-05-15 LAB — COMPREHENSIVE METABOLIC PANEL
ALT: 32 U/L (ref 0–35)
AST: 22 U/L (ref 0–37)
Albumin: 4.4 g/dL (ref 3.5–5.2)
Alkaline Phosphatase: 102 U/L (ref 39–117)
BUN: 15 mg/dL (ref 6–23)
CO2: 27 meq/L (ref 19–32)
Calcium: 9.4 mg/dL (ref 8.4–10.5)
Chloride: 104 meq/L (ref 96–112)
Creatinine, Ser: 0.69 mg/dL (ref 0.40–1.20)
GFR: 86.17 mL/min (ref >60.00)
Glucose, Bld: 98 mg/dL (ref 70–99)
Potassium: 4.2 meq/L (ref 3.5–5.1)
Sodium: 139 meq/L (ref 135–145)
Total Bilirubin: 0.6 mg/dL (ref 0.2–1.2)
Total Protein: 7.3 g/dL (ref 6.0–8.3)

## 2023-05-15 LAB — TSH: TSH: 2.02 u[IU]/mL (ref 0.35–5.50)

## 2023-05-15 LAB — VITAMIN D 25 HYDROXY (VIT D DEFICIENCY, FRACTURES): VITD: 46.92 ng/mL (ref 30.00–100.00)

## 2023-05-15 LAB — VITAMIN B12: Vitamin B-12: 795 pg/mL (ref 211–911)

## 2023-05-15 LAB — HEMOGLOBIN A1C: Hgb A1c MFr Bld: 6.2 % (ref 4.6–6.5)

## 2023-05-15 MED ORDER — AMLODIPINE BESYLATE 2.5 MG PO TABS: 2.5000 mg | ORAL_TABLET | Freq: Every day | ORAL | 3 refills | Status: DC

## 2023-05-15 MED ORDER — GABAPENTIN 300 MG PO CAPS: 300.0000 mg | ORAL_CAPSULE | Freq: Three times a day (TID) | ORAL | 3 refills | Status: DC

## 2023-05-15 NOTE — Progress Notes (Signed)
SUBJECTIVE:   Chief Complaint  Patient presents with   Medical Management of Chronic Issues    Discuss neuro   HPI Presents to clinic for follow up chronic disease management  Discussed the use of AI scribe software for clinical note transcription with the patient, who gave verbal consent to proceed.  History of Present Illness The patient, with a history of chronic back pain, presents with an increase in the frequency and intensity of pain flare-ups. They sought consultation with a neurosurgeon, who did not identify a conjoined nerve as the cause of the pain, but suspected the dorsal root ganglion.  The patient reports that the pain is worsening and becoming more frequent. They have been managing the pain with Norco and Advil, but express concern about the potential for collagenous colitis due to Advil use. They also take 2 milligrams of Valium as needed for pain management. The patient has been prescribed gabapentin, which they take three times a day, but they express a preference for a single 300mg  capsule instead of three 100mg  tablets.  The patient also reports a recent heart CT scan and echocardiogram, which they believe showed no significant abnormalities. However, they express dissatisfaction with their previous cardiologist and have chosen not to pursue further cardiac treatment. They have tried Lipitor for cholesterol management but discontinued it due to dizziness. They express a preference for focusing on pain management rather than cardiac issues.  The patient maintains an active lifestyle, walking an hour a day and achieving 10,000 steps daily. However, they report that certain activities, such as bouncing or being tapped on the back, can trigger pain flare-ups. They also report that they have gained weight recently.  The patient has a Do Not Resuscitate (DNR) order and expresses a strong preference for quality of life over length of life. They express concern about potential  inadequate pain management if they were to be admitted to a healthcare facility. They request a specific care plan that reflects their current use of Norco and Valium to manage their pain.    PERTINENT PMH / PSH: As above  OBJECTIVE:  BP (!) 142/84   Pulse 68   Temp 98.2 F (36.8 C) (Oral)   Ht 5\' 5"  (1.651 m)   Wt 187 lb 3.2 oz (84.9 kg)   SpO2 96%   BMI 31.15 kg/m    Physical Exam Vitals reviewed.  Constitutional:      General: She is not in acute distress.    Appearance: Normal appearance. She is normal weight. She is not ill-appearing, toxic-appearing or diaphoretic.  Eyes:     General:        Right eye: No discharge.        Left eye: No discharge.     Conjunctiva/sclera: Conjunctivae normal.  Cardiovascular:     Rate and Rhythm: Normal rate and regular rhythm.     Heart sounds: Normal heart sounds.  Pulmonary:     Effort: Pulmonary effort is normal.     Breath sounds: Normal breath sounds.  Abdominal:     General: Bowel sounds are normal.  Musculoskeletal:        General: Normal range of motion.  Skin:    General: Skin is warm and dry.  Neurological:     General: No focal deficit present.     Mental Status: She is alert and oriented to person, place, and time. Mental status is at baseline.  Psychiatric:        Mood and Affect: Mood  normal.        Behavior: Behavior normal.        Thought Content: Thought content normal.        Judgment: Judgment normal.        05/15/2023    8:05 AM 11/12/2022    8:55 AM 10/30/2022   11:20 AM 10/17/2022    2:02 PM 10/01/2022    9:20 AM  Depression screen PHQ 2/9  Decreased Interest 1 0 0 0 0  Down, Depressed, Hopeless 1 0 0 0 0  PHQ - 2 Score 2 0 0 0 0  Altered sleeping 1 0   1  Tired, decreased energy 0 0   0  Change in appetite 1 0   1  Feeling bad or failure about yourself  0 0   0  Trouble concentrating 0 0   0  Moving slowly or fidgety/restless 0 0   0  Suicidal thoughts 0 0   0  PHQ-9 Score 4 0   2  Difficult  doing work/chores Not difficult at all Not difficult at all   Not difficult at all      05/15/2023    8:05 AM 11/12/2022    8:55 AM 10/01/2022    9:21 AM  GAD 7 : Generalized Anxiety Score  Nervous, Anxious, on Edge 0 0 0  Control/stop worrying 0 0 0  Worry too much - different things 0 0 0  Trouble relaxing 0 0 0  Restless 0 0 0  Easily annoyed or irritable 0 0 0  Afraid - awful might happen 0 0 0  Total GAD 7 Score 0 0 0  Anxiety Difficulty  Not difficult at all Not difficult at all    ASSESSMENT/PLAN:  Chronic low back pain with sciatica, sciatica laterality unspecified, unspecified back pain laterality Assessment & Plan: Increasing frequency and intensity of flare-ups, suspected to be related to dorsal root ganglion. Patient has an upcoming appointment with a rehab specialist for further management. Currently managed with Norco and Advil, but patient is reluctant to use these due to side effects and potential for dependency. -Continue current pain management regimen. -Refill Gabapentin 300 mg TID -Consider alternative pain management strategies after consultation with rehab specialist.   Essential hypertension Assessment & Plan: Chronic.  Well-controlled per JNC 8 guidelines goal less than 150/90 for age Continue amlodipine 2.5 mg daily Continue to monitor blood pressure at home.    Orders: -     Comprehensive metabolic panel -     amLODIPine Besylate; Take 1 tablet (2.5 mg total) by mouth daily. In am if BP>140/?90 take another tablet  Dispense: 180 tablet; Refill: 3  Vitamin D deficiency -     VITAMIN D 25 Hydroxy (Vit-D Deficiency, Fractures)  Hypothyroidism, unspecified type -     TSH  Abnormal glucose -     Hemoglobin A1c  Hyperlipidemia, unspecified hyperlipidemia type Assessment & Plan: High cholesterol levels with significant plaque buildup in the left anterior descending artery, causing stenosis. Patient has tried Lipitor but experienced dizziness. Patient  is not interested in Repatha injection. -Provide patient with information on Repatha for consideration. -Consider alternative lipid-lowering therapies.  Orders: -     Lipid panel  Vitamin B 12 deficiency -     Vitamin B12  Lumbar radiculopathy -     Gabapentin; Take 1 capsule (300 mg total) by mouth 3 (three) times daily.  Dispense: 270 capsule; Refill: 3  DNR (do not resuscitate) Assessment & Plan: Patient wishes  to have second DNR form completed. Endorses does not want to have CPR initiated in the event of cardiac arrest.  Does not want any heroic measures.  Does not want any ACLS medications or intubation. -Will complete form and have patient pick up when completed   Weight gain Assessment & Plan: -Check thyroid function as patient has reported increased appetite and weight gain.    PDMP reviewed  Return if symptoms worsen or fail to improve, for PCP.  Dana Allan, MD

## 2023-05-15 NOTE — Patient Instructions (Signed)
It was a pleasure meeting you today. Thank you for allowing me to take part in your health care.  Our goals for today as we discussed include:  We will get some labs today.  If they are abnormal or we need to do something about them, I will call you.  If they are normal, I will send you a message on MyChart (if it is active) or a letter in the mail.  If you don't hear from [place] in 2 weeks, please call the office at the number below.    This is a list of the screening recommended for you and due dates:  Health Maintenance  Topic Date Due   Pneumonia Vaccine (1 of 2 - PCV) Never done   Mammogram  07/17/2023   Medicare Annual Wellness Visit  10/17/2023   DTaP/Tdap/Td vaccine (2 - Td or Tdap) 06/28/2025   Colon Cancer Screening  01/04/2032   Flu Shot  Completed   DEXA scan (bone density measurement)  Completed   COVID-19 Vaccine  Completed   Hepatitis C Screening  Completed   Zoster (Shingles) Vaccine  Completed   HPV Vaccine  Aged Out     Follow up as needed  If you have any questions or concerns, please do not hesitate to call the office at [phone].  I look forward to our next visit and until then take care and stay safe.  Regards,   Dana Allan, MD   [facility]

## 2023-05-21 ENCOUNTER — Encounter: Payer: Self-pay | Admitting: Family Medicine

## 2023-05-21 DIAGNOSIS — R635 Abnormal weight gain: Secondary | ICD-10-CM | POA: Insufficient documentation

## 2023-05-21 DIAGNOSIS — Z66 Do not resuscitate: Secondary | ICD-10-CM | POA: Insufficient documentation

## 2023-05-21 NOTE — Assessment & Plan Note (Signed)
Chronic.  Well-controlled per JNC 8 guidelines goal less than 150/90 for age Continue amlodipine 2.5 mg daily Continue to monitor blood pressure at home.

## 2023-05-21 NOTE — Assessment & Plan Note (Addendum)
Increasing frequency and intensity of flare-ups, suspected to be related to dorsal root ganglion. Patient has an upcoming appointment with a rehab specialist for further management. Currently managed with Norco and Advil, but patient is reluctant to use these due to side effects and potential for dependency. -Continue current pain management regimen. -Refill Gabapentin 300 mg TID -Consider alternative pain management strategies after consultation with rehab specialist.

## 2023-05-21 NOTE — Assessment & Plan Note (Signed)
-  Check thyroid function as patient has reported increased appetite and weight gain.

## 2023-05-21 NOTE — Assessment & Plan Note (Signed)
High cholesterol levels with significant plaque buildup in the left anterior descending artery, causing stenosis. Patient has tried Lipitor but experienced dizziness. Patient is not interested in Repatha injection. -Provide patient with information on Repatha for consideration. -Consider alternative lipid-lowering therapies.

## 2023-05-21 NOTE — Assessment & Plan Note (Signed)
Patient wishes to have second DNR form completed. Endorses does not want to have CPR initiated in the event of cardiac arrest.  Does not want any heroic measures.  Does not want any ACLS medications or intubation. -Will complete form and have patient pick up when completed

## 2023-05-27 ENCOUNTER — Encounter: Payer: Self-pay | Admitting: Family Medicine

## 2023-05-28 ENCOUNTER — Other Ambulatory Visit: Payer: Self-pay

## 2023-05-28 DIAGNOSIS — M62838 Other muscle spasm: Secondary | ICD-10-CM

## 2023-05-28 MED ORDER — DIAZEPAM 2 MG PO TABS: ORAL_TABLET | ORAL | 0 refills | Status: DC

## 2023-05-28 NOTE — Telephone Encounter (Signed)
Called pt to let her know that the DNR has been filled out and up front waiting to be picked up.

## 2023-06-05 ENCOUNTER — Other Ambulatory Visit: Payer: Self-pay | Admitting: Family Medicine

## 2023-06-05 DIAGNOSIS — Z1231 Encounter for screening mammogram for malignant neoplasm of breast: Secondary | ICD-10-CM

## 2023-06-10 ENCOUNTER — Encounter: Payer: Self-pay | Admitting: Family Medicine

## 2023-06-11 NOTE — Telephone Encounter (Signed)
 FYI

## 2023-06-26 ENCOUNTER — Ambulatory Visit: Payer: Medicare Other | Admitting: Physical Medicine and Rehabilitation

## 2023-07-12 ENCOUNTER — Encounter: Payer: Self-pay | Admitting: Physical Medicine and Rehabilitation

## 2023-07-12 ENCOUNTER — Encounter
Payer: Medicare Other | Attending: Physical Medicine and Rehabilitation | Admitting: Physical Medicine and Rehabilitation

## 2023-07-12 VITALS — BP 152/88 | HR 82 | Ht 65.0 in | Wt 182.0 lb

## 2023-07-12 DIAGNOSIS — M62838 Other muscle spasm: Secondary | ICD-10-CM | POA: Insufficient documentation

## 2023-07-12 DIAGNOSIS — G629 Polyneuropathy, unspecified: Secondary | ICD-10-CM | POA: Diagnosis present

## 2023-07-12 DIAGNOSIS — M5416 Radiculopathy, lumbar region: Secondary | ICD-10-CM | POA: Diagnosis present

## 2023-07-12 DIAGNOSIS — G8929 Other chronic pain: Secondary | ICD-10-CM | POA: Insufficient documentation

## 2023-07-12 MED ORDER — DULOXETINE HCL 20 MG PO CPEP: 20.0000 mg | ORAL_CAPSULE | Freq: Every day | ORAL | 3 refills | Status: DC

## 2023-07-12 MED ORDER — ONDANSETRON HCL 4 MG PO TABS: 4.0000 mg | ORAL_TABLET | Freq: Three times a day (TID) | ORAL | 1 refills | Status: AC | PRN

## 2023-07-12 NOTE — Progress Notes (Signed)
Subjective:    Patient ID: Tonya Green, female    DOB: Sep 08, 1950, 73 y.o.   MRN: [id number]  HPI Patient is a 73 yr old female with hx of CAD, HTN, hx of DVT 2010 after back surgery- R-L4-5 Microdisectomy for Herniated disc - ; HLD, Factor V Leidin Mutation, osteopenia-  Hypothyroidism- last TSH 2.02  Here for evaluation of chronic low back pain.    Referred by Dr Danielle Dess-  saw due to daughter conjoined nerve- and ongoing radiculopathy flare ups- talked to him about flare ups-   Also had an ESI- in [place]-   Dr Quenton Fetter also gave ESI- didn't do a darn thing  Had for 16 years.  Wants ot know if there's anything new to help pain-   Norco 5/325 q3 hours until course is done- never had to take more than 3 tabs and Valium 2 mg only to begin with- .   If can detect one coming on- for example, sneezing fit- can take Norco 1/2 tab and 2 mg Valium- can catch it before gets bad.   Full blown flare- occur in middle of night usually.  Needs to keep everything in perfect alignment-  doesn't like tap on back or bounce is terrible- when keeps good posture And walks 1_ hour/day- 10k steps 3-4x/week.   Initially after surgery, had no strength in Rle-    When cannot walk, gets weaker and flares occur faster.  Last flare was end of December- and "shot" for 2-3 days, but doesn't need meds after first day, but 2-3 days to feel normal again.  Out of it, when that occurs  Used to have 1-2x/year- now ~4/year and escalating.   Doesn't trust if someone will be able to care for her when this occurs.   Gait was off when saw Dr Danielle Dess- but thinks it's back to normal.  Advil doesn't turn her into "space case" like Norco.  Destroyed colon taking Advil- so cannot take NSAIDs anymoe very much.   Doesn't like opiates.    Tried: Takes Gabapentin 300 mg TID-  Used - not a TENS unit- Dr Joni Fears- - frequency specific microcurrent- titrated specific for her.  - uses only when has a flare- interrupts pain flare  pretty well.  Also uses infrared light.  Really sensitive to meds.  Meditates 1 hour/day  Tried Lyrica- was out of it after back surgery.  Never tried Duloxetine-  - Never tried more than 1200 mg/day of gabapentin  Social Hx retired Therapist, sports.    Pain Inventory Average Pain 2 Pain Right Now 2 My pain is sharp, burning, and tingling  In the last 24 hours, has pain interfered with the following? General activity 0 Relation with others 0 Enjoyment of life 0 What TIME of day is your pain at its worst? varies Sleep (in general) Poor  Pain is worse with:  bouncing Pain improves with: rest, heat/ice, medication, and TENS Relief from Meds: 8  walk without assistance how many minutes can you walk? hours ability to climb steps?  no do you drive?  no  retired  numbness tingling loss of taste or smell  New pt  New pt    Family History  Problem Relation Age of Onset   Hypertension Mother    Stroke Mother        in 59s    Heart disease Father    Stroke Father    Stroke Brother    Esophageal cancer Brother  Liver cancer Paternal Aunt    Stroke Paternal Grandfather    Other Daughter        chronic back pain nerve sheath abnormal in spine s/p surgery   Hyperlipidemia Other    Social History   Socioeconomic History   Marital status: Married    Spouse name: Not on file   Number of children: 2   Years of education: Not on file   Highest education level: Not on file  Occupational History   Occupation: Retired  Tobacco Use   Smoking status: Never   Smokeless tobacco: Never  Vaping Use   Vaping status: Never Used  Substance and Sexual Activity   Alcohol use: Yes    Comment: occ   Drug use: Yes    Types: Hydrocodone    Comment: PRN for pain flare ups   Sexual activity: Not on file  Other Topics Concern   Not on file  Social History Narrative   BSN CCM, RN   Moved from [place] recently    2 daughters    Retired and married    No guns, wears  seat belt, safe in relationship    Lives at [employer] [facility]    Social Drivers of Health   Financial Resource Strain: Low Risk  (10/17/2022)   Overall Financial Resource Strain (CARDIA)    Difficulty of Paying Living Expenses: Not hard at all  Food Insecurity: No Food Insecurity (10/30/2022)   Hunger Vital Sign    Worried About Running Out of Food in the Last Year: Never true    Ran Out of Food in the Last Year: Never true  Transportation Needs: No Transportation Needs (10/30/2022)   PRAPARE - Administrator, Civil Service (Medical): No    Lack of Transportation (Non-Medical): No  Physical Activity: Sufficiently Active (10/17/2022)   Exercise Vital Sign    Days of Exercise per Week: 5 days    Minutes of Exercise per Session: 30 min  Stress: No Stress Concern Present (10/17/2022)   [employer] of Occupational Health - Occupational Stress Questionnaire    Feeling of Stress : Not at all  Social Connections: Moderately Integrated (10/17/2022)   Social Connection and Isolation Panel [NHANES]    Frequency of Communication with Friends and Family: More than three times a week    Frequency of Social Gatherings with Friends and Family: More than three times a week    Attends Religious Services: Never    Database administrator or Organizations: Yes    Attends Engineer, structural: More than 4 times per year    Marital Status: Married   Past Surgical History:  Procedure Laterality Date   APPENDECTOMY     2000   BACK SURGERY     BREAST EXCISIONAL BIOPSY Left 2005   for infection   BREAST SURGERY     biospy ? year    CATARACT EXTRACTION W/PHACO Left 02/11/2023   Procedure: CATARACT EXTRACTION PHACO AND INTRAOCULAR LENS PLACEMENT (IOC) LEFT  CLAREON VIVITY TORIC LENS 2.60 00:26.2;  Surgeon: Nevada Crane, MD;  Location[facility];  Service: Ophthalmology;  Laterality: Left;   CATARACT EXTRACTION W/PHACO Right 03/25/2023   Procedure: CATARACT EXTRACTION PHACO  AND INTRAOCULAR LENS PLACEMENT (IOC) RIGHT  CLAREON VIVITY TORIC LENS;  Surgeon: Nevada Crane, MD;  Location: [facility];  Service: Ophthalmology;  Laterality: Right;  3.34 0:33.7   COLONOSCOPY     COLONOSCOPY WITH PROPOFOL N/A 01/01/2022   Procedure: COLONOSCOPY  WITH PROPOFOL;  Surgeon: Regis Bill, MD;  Location[facility];  Service: Endoscopy;  Laterality: N/A;   MOHS SURGERY  05/11/2019   left nasal side wall bcc Dr. Jeannine Boga in [place]    ruptured disc repair     L4/5 10/2008 with complications residual right leg weakness/reduced sensation and DVT in 2010 post surgery    Past Medical History:  Diagnosis Date   Basal cell carcinoma    left nasal sidewall [facility] Dr. Jeannine Boga   Carotid artery stenosis    mild L>R 03/26/16 [place]    Cataract of left eye 05/25/2021   Chronic cough 04/03/2022   Complication of anesthesia    slow to wake   Diverticulosis    colonoscopy 10/13/16 [place]   Factor 5 Leiden mutation, heterozygous (HCC)    Fatty liver    Heterozygous factor V Leiden mutation (HCC) 11/01/2017   History of blood transfusion    birth Rh factor    History of skin cancer 11/01/2017   H/o BCC forehead and SCC nose    Hyperlipidemia    Hypertension    Hyponatremia 12/18/2017   Leukocytosis 12/18/2017   OSA on CPAP    Radiculopathy    Right leg DVT (HCC)    2010 s/p back surgery was on Coumadin x 6 months    Thyroid disease    hypothyroidism    UTI (urinary tract infection) 08/21/2018   BP (!) 152/88   Pulse 82   Ht 5\' 5"  (1.651 m)   Wt 182 lb (82.6 kg)   SpO2 98%   BMI 30.29 kg/m   Opioid Risk Score:   Fall Risk Score:  `1  Depression screen [facility] 2/9     05/15/2023    8:05 AM 11/12/2022    8:55 AM 10/30/2022   11:20 AM 10/17/2022    2:02 PM 10/01/2022    9:20 AM 04/30/2022    3:06 PM 09/28/2021    9:59 AM  Depression screen PHQ 2/9  Decreased Interest 1 0 0 0 0 0 0  Down, Depressed, Hopeless 1 0 0 0 0 0 0  PHQ - 2  Score 2 0 0 0 0 0 0  Altered sleeping 1 0   1    Tired, decreased energy 0 0   0    Change in appetite 1 0   1    Feeling bad or failure about yourself  0 0   0    Trouble concentrating 0 0   0    Moving slowly or fidgety/restless 0 0   0    Suicidal thoughts 0 0   0    PHQ-9 Score 4 0   2    Difficult doing work/chores Not difficult at all Not difficult at all   Not difficult at all        Review of Systems  Constitutional:  Positive for unexpected weight change.  Respiratory:  Positive for apnea.   Musculoskeletal:  Positive for back pain and neck pain.       Pain in right leg  Neurological:  Positive for numbness.  Hematological:  Bruises/bleeds easily.  All other systems reviewed and are negative.      Objective:   Physical Exam Awake, alert, appropriate, accompanied by husband, NAD  MSK: LLE_ 5/5 in HF, KE< KF, DF and PF RLE- HF 5/5; KE and KF 5/5; DF and PF 4/5 Cannot stand on toes or heels on R side very well  Neuro: decreased to light touch in L4- S1 on R Slightly brisk R patella but 2+ in L patella and B/L Achilles       Assessment & Plan:   Patient is a 73 yr old female with hx of CAD, HTN, hx of DVT 2010 after back surgery- R-L4-5 Microdisectomy for Herniated disc - ; HLD, Factor V Leidin Mutation, osteopenia-  Hypothyroidism- last TSH 2.02  Here for evaluation of chronic low back pain.   Walking is what keeps her pain under better control- so keep doing so!  2.    If has flare, will need Norco 5/325 every 3 hours for 3-5 times on first day of flare- -  and Valium 2 mg x1 at beginning of episode. Flares no more than 10x/year.    3. Discussed Ketamine- can consider in future, but I think we need to try Oral meds first.    4.  Discussed trying gabapentin high dose vs Duloxetine-   And risks/benefits of both. Decided to stick with Duloxetine.   5.  Will try Duloxetine  Start Duloxetine/Cymbalta 20 mg nightly x 1 week Then 40 mg nightly x 1 week  Then  60 mg nightly- for nerve pain 1% of patients can have nausea with Duloxetine- call me if needs an anti-nausea medicine. Can also cause mild dry mouth/dry eyes and mild constipation. Can switch to night time if need be, if makes sleepy.   6. Zofran  4 mg 3x/day as needed- for nausea-   7. Stay with gabapentin-  as well 300 mg TID.    8. Doesn't need refill of Norco or Valium- getting from PCP.   9. F/U in 3 months- to see how pain doing- call or mychart message in 1 month to let me know if problems.     I spent a total of  48  minutes on total care today- >50% coordination of care- due to discussed options for pain/flare control and to try and prevent- and how to take that meds- went over options and how to take her Norco when has flare.

## 2023-07-12 NOTE — Patient Instructions (Signed)
Patient is a 73 yr old female with hx of CAD, HTN, hx of DVT 2010 after back surgery- R-L4-5 Microdisectomy for Herniated disc - ; HLD, Factor V Leidin Mutation, osteopenia-  Hypothyroidism- last TSH 2.02  Here for evaluation of chronic low back pain.   Walking is what keeps her pain under better control- so keep doing so!  2.    If has flare, will need Norco 5/325 every 3 hours for 3-4 times on first day of flare- -  and Valium 2 mg x1 at beginning of episode.    3. Discussed Ketamine- can consider in future, but I think we need to try Oral meds first.    4.  Discussed trying gabapentin high dose vs Duloxetine-   And risks/benefits of both. Decided to stick with Duloxetine.   5.  Will try Duloxetine  Start Duloxetine/Cymbalta 20 mg nightly x 1 week Then 40 mg nightly x 1 week  Then 60 mg nightly- for nerve pain 1% of patients can have nausea with Duloxetine- call me if needs an anti-nausea medicine. Can also cause mild dry mouth/dry eyes and mild constipation. Can switch to night time if need be, if makes sleepy.   6. Zofran  4 mg 3x/day as needed- for nausea-   7. Stay with gabapentin-  as well 300 mg TID.    8. Doesn't need refill of Norco or Valium- getting from PCP.   9. F/U in 3 months- to see how pain doing- call or mychart message in 1 month to let me know if problems.

## 2023-07-19 ENCOUNTER — Ambulatory Visit
Admission: RE | Admit: 2023-07-19 | Discharge: 2023-07-19 | Disposition: A | Discharge status: Home / Self Care | Payer: Medicare Other | Source: Ambulatory Visit | Attending: Family Medicine | Admitting: Family Medicine

## 2023-07-19 DIAGNOSIS — Z1231 Encounter for screening mammogram for malignant neoplasm of breast: Secondary | ICD-10-CM | POA: Insufficient documentation

## 2023-08-03 ENCOUNTER — Encounter: Payer: Self-pay | Admitting: Physical Medicine and Rehabilitation

## 2023-08-08 MED ORDER — DULOXETINE HCL 20 MG PO CPEP: 40.0000 mg | ORAL_CAPSULE | Freq: Every day | ORAL | 3 refills | Status: DC

## 2023-09-09 ENCOUNTER — Encounter (HOSPITAL_BASED_OUTPATIENT_CLINIC_OR_DEPARTMENT_OTHER): Payer: Self-pay

## 2023-10-01 ENCOUNTER — Ambulatory Visit (HOSPITAL_BASED_OUTPATIENT_CLINIC_OR_DEPARTMENT_OTHER): Payer: Medicare Other | Admitting: Pulmonary Disease

## 2023-10-11 ENCOUNTER — Ambulatory Visit: Payer: Medicare Other | Admitting: Physical Medicine and Rehabilitation

## 2023-10-14 ENCOUNTER — Encounter: Payer: Self-pay | Admitting: Physical Medicine and Rehabilitation

## 2023-10-14 ENCOUNTER — Encounter: Attending: Physical Medicine and Rehabilitation | Admitting: Physical Medicine and Rehabilitation

## 2023-10-14 VITALS — BP 131/81 | HR 84 | Ht 65.0 in | Wt 187.0 lb

## 2023-10-14 DIAGNOSIS — M5440 Lumbago with sciatica, unspecified side: Secondary | ICD-10-CM | POA: Diagnosis present

## 2023-10-14 DIAGNOSIS — G8929 Other chronic pain: Secondary | ICD-10-CM | POA: Diagnosis present

## 2023-10-14 DIAGNOSIS — M5416 Radiculopathy, lumbar region: Secondary | ICD-10-CM | POA: Diagnosis present

## 2023-10-14 NOTE — Patient Instructions (Signed)
 Patient is a 73 yr old female with hx of CAD, HTN, hx of DVT 2010 after back surgery- R-L4-5 Microdisectomy for Herniated disc - ; HLD, Factor V Leidin Mutation, osteopenia-  Hypothyroidism- last TSH 2.02  Will wean Duloxetine  to 20 mg daily/at noon- for 3-4 weeks- then stop.   2. Husband asked about stem cell treatments- we discussed that there isn't a protocol and is experimental at this time.    3. Will get feedback from cousin- and see if had some other input.    4.  Give it a period of 2 weeks- without meds. Then CALL me to try Keppra tiny dose- 250 mg nightly- then can increase to 250 mg 2x/day if tolerated. Went over can cause irritability 3-5%   5.   Needs to get me to take over Norco and Valium  for flares.  - give me a call when needs Norco and Valium  and I can take over- will need opiate contract.   6.  Just refilled Duloxetine , so doesn't need refill.   7. F/U in 3 months- but CALL me in 4-6 weeks 2 weeks off meds when you call, my chart message-

## 2023-10-14 NOTE — Progress Notes (Signed)
 Subjective:    Patient ID: Tonya Green, female    DOB: Feb 15, 1951, 73 y.o.   MRN: [id number]  HPI Patient is a 73 yr old female with hx of CAD, HTN, hx of DVT 2010 after back surgery- R-L4-5 Microdisectomy for Herniated disc - ; HLD, Factor V Leidin Mutation, osteopenia-  Hypothyroidism- last TSH 2.02    So have had 4 flares since Valentine's Day.   Duloxetine  feels overmedicated on 40 mg at bedtime No energy.  Really disrupting routine and cannot stay awake.   Thought if decreased it, would have more flareups.  Taking it at noon- sleeping 10 hours/day-  Used to walk 1 hour/day and Yoga- but too difficult to do.   Because so sedated and sore- joints are sore as well.    Had Episode of Afib- watch told her.  Never had before.    Had flare yesterday- as well.  Chairs were bad and set flare up.   Didn't bring cushion- coccyx broken and never been fixed.   Hard to figure out to    Dr Ruthann Cover note said could be dorsal root ganglion.   Husband asked about stem cell treatments- we discussed that there isn't a protocol and is experimental at this time.    Already taken gabapentin  900 mg/day-  Lyrica made her sedated.  ' Duloxetine  made her feel BAD/depressed.   Usually uses Norco max 2.5 tabs each flare and Valium  2 mg once.   No energy for anything. So really thinks Sx's from Duloxetine  .  Pain Inventory Average Pain 3 Pain Right Now 5 My pain is flare up electric shocks  In the last 24 hours, has pain interfered with the following? General activity 7 Relation with others 7 Enjoyment of life 7 What TIME of day is your pain at its worst? varies Sleep (in general) Good  Pain is worse with: bending and sitting Pain improves with: rest, medication, and FSM Relief from Meds: 10  Family History  Problem Relation Age of Onset   Hypertension Mother    Stroke Mother        in 29s    Heart disease Father    Stroke Father    Other Daughter        chronic back pain  nerve sheath abnormal in spine s/p surgery   Liver cancer Paternal Aunt    Stroke Paternal Grandfather    Stroke Brother    Esophageal cancer Brother    Breast cancer Other 37       niece   Hyperlipidemia Other    Social History   Socioeconomic History   Marital status: Married    Spouse name: Not on file   Number of children: 2   Years of education: Not on file   Highest education level: Not on file  Occupational History   Occupation: Retired  Tobacco Use   Smoking status: Never   Smokeless tobacco: Never  Vaping Use   Vaping status: Never Used  Substance and Sexual Activity   Alcohol use: Yes    Comment: occ   Drug use: Yes    Types: Hydrocodone     Comment: PRN for pain flare ups   Sexual activity: Not on file  Other Topics Concern   Not on file  Social History Narrative   BSN CCM, RN   Moved from [place]  recently    2 daughters    Retired and married    No guns, wears seat belt, safe in relationship  Lives at [employer]    Social Drivers of Health   Financial Resource Strain: Low Risk  (10/17/2022)   Overall Financial Resource Strain (CARDIA)    Difficulty of Paying Living Expenses: Not hard at all  Food Insecurity: No Food Insecurity (10/30/2022)   Hunger Vital Sign    Worried About Running Out of Food in the Last Year: Never true    Ran Out of Food in the Last Year: Never true  Transportation Needs: No Transportation Needs (10/30/2022)   PRAPARE - Administrator, Civil Service (Medical): No    Lack of Transportation (Non-Medical): No  Physical Activity: Sufficiently Active (10/17/2022)   Exercise Vital Sign    Days of Exercise per Week: 5 days    Minutes of Exercise per Session: 30 min  Stress: No Stress Concern Present (10/17/2022)   [employer] of Occupational Health - Occupational Stress Questionnaire    Feeling of Stress : Not at all  Social Connections: Moderately Integrated (10/17/2022)   Social Connection and Isolation Panel  [NHANES]    Frequency of Communication with Friends and Family: More than three times a week    Frequency of Social Gatherings with Friends and Family: More than three times a week    Attends Religious Services: Never    Database administrator or Organizations: Yes    Attends Engineer, structural: More than 4 times per year    Marital Status: Married   Past Surgical History:  Procedure Laterality Date   APPENDECTOMY     2000   BACK SURGERY     BREAST EXCISIONAL BIOPSY Left 2005   for infection   BREAST SURGERY     biospy ? year    CATARACT EXTRACTION W/PHACO Left 02/11/2023   Procedure: CATARACT EXTRACTION PHACO AND INTRAOCULAR LENS PLACEMENT (IOC) LEFT  CLAREON VIVITY TORIC LENS 2.60 00:26.2;  Surgeon: Rosa College, MD;  Location: [facility];  Service: Ophthalmology;  Laterality: Left;   CATARACT EXTRACTION W/PHACO Right 03/25/2023   Procedure: CATARACT EXTRACTION PHACO AND INTRAOCULAR LENS PLACEMENT (IOC) RIGHT  CLAREON VIVITY TORIC LENS;  Surgeon: Rosa College, MD;  Location: [facility];  Service: Ophthalmology;  Laterality: Right;  3.34 0:33.7   COLONOSCOPY     COLONOSCOPY WITH PROPOFOL  N/A 01/01/2022   Procedure: COLONOSCOPY WITH PROPOFOL ;  Surgeon: Shane Darling, MD;  Location: [facility] ENDOSCOPY;  Service: Endoscopy;  Laterality: N/A;   MOHS SURGERY  05/11/2019   left nasal side wall bcc Dr. Gideon Kussmaul in [place]    ruptured disc repair     L4/5 10/2008 with complications residual right leg weakness/reduced sensation and DVT in 2010 post surgery    Past Surgical History:  Procedure Laterality Date   APPENDECTOMY     2000   BACK SURGERY     BREAST EXCISIONAL BIOPSY Left 2005   for infection   BREAST SURGERY     biospy ? year    CATARACT EXTRACTION W/PHACO Left 02/11/2023   Procedure: CATARACT EXTRACTION PHACO AND INTRAOCULAR LENS PLACEMENT (IOC) LEFT  CLAREON VIVITY TORIC LENS 2.60 00:26.2;  Surgeon: Rosa College, MD;  Location: [facility];  Service: Ophthalmology;  Laterality: Left;   CATARACT EXTRACTION W/PHACO Right 03/25/2023   Procedure: CATARACT EXTRACTION PHACO AND INTRAOCULAR LENS PLACEMENT (IOC) RIGHT  CLAREON VIVITY TORIC LENS;  Surgeon: Rosa College, MD;  Location: [facility];  Service: Ophthalmology;  Laterality: Right;  3.34 0:33.7   COLONOSCOPY  COLONOSCOPY WITH PROPOFOL  N/A 01/01/2022   Procedure: COLONOSCOPY WITH PROPOFOL ;  Surgeon: Shane Darling, MD;  Location: [facility] ENDOSCOPY;  Service: Endoscopy;  Laterality: N/A;   MOHS SURGERY  05/11/2019   left nasal side wall bcc Dr. Gideon Kussmaul in [place]    ruptured disc repair     L4/5 10/2008 with complications residual right leg weakness/reduced sensation and DVT in 2010 post surgery    Past Medical History:  Diagnosis Date   Basal cell carcinoma    left nasal sidewall [facility]   Carotid artery stenosis    mild L>R 03/26/16 [place]     Cataract of left eye 05/25/2021   Chronic cough 04/03/2022   Complication of anesthesia    slow to wake   Diverticulosis    colonoscopy 10/13/16 [place]    Factor 5 Leiden mutation, heterozygous (HCC)    Fatty liver    Heterozygous factor V Leiden mutation (HCC) 11/01/2017   History of blood transfusion    birth Rh factor    History of skin cancer 11/01/2017   H/o BCC forehead and SCC nose    Hyperlipidemia    Hypertension    Hyponatremia 12/18/2017   Leukocytosis 12/18/2017   OSA on CPAP    Radiculopathy    Right leg DVT (HCC)    2010 s/p back surgery was on Coumadin x 6 months    Thyroid  disease    hypothyroidism    UTI (urinary tract infection) 08/21/2018   BP 131/81   Pulse 84   Ht 5\' 5"  (1.651 m)   Wt 187 lb (84.8 kg)   SpO2 94%   BMI 31.12 kg/m   Opioid Risk Score:   Fall Risk Score:  `1  Depression screen [facility] 2/9     07/12/2023    8:47 AM 05/15/2023    8:05 AM 11/12/2022    8:55 AM 10/30/2022   11:20 AM 10/17/2022    2:02 PM 10/01/2022     9:20 AM 04/30/2022    3:06 PM  Depression screen PHQ 2/9  Decreased Interest 1 1 0 0 0 0 0  Down, Depressed, Hopeless 1 1 0 0 0 0 0  PHQ - 2 Score 2 2 0 0 0 0 0  Altered sleeping 3 1 0   1   Tired, decreased energy 0 0 0   0   Change in appetite 1 1 0   1   Feeling bad or failure about yourself  0 0 0   0   Trouble concentrating 0 0 0   0   Moving slowly or fidgety/restless 0 0 0   0   Suicidal thoughts 1 0 0   0   PHQ-9 Score 7 4 0   2   Difficult doing work/chores Not difficult at all Not difficult at all Not difficult at all   Not difficult at all      Review of Systems  Musculoskeletal:  Positive for gait problem.       Right lower leg pain  All other systems reviewed and are negative.     Objective:   Physical Exam Awake, alert, appropriate, in tears once; accompanied by wife, NAD        Assessment & Plan:   Patient is a 73 yr old female with hx of CAD, HTN, hx of DVT 2010 after back surgery- R-L4-5 Microdisectomy for Herniated disc - ; HLD, Factor V Leidin Mutation, osteopenia-  Hypothyroidism- last TSH 2.02  Will wean Duloxetine  to 20 mg daily/at noon- for 3-4 weeks- then stop.   2. Husband asked about stem cell treatments- we discussed that there isn't a protocol and is experimental at this time.    3. Will get feedback from cousin- and see if had some other input.    4.  Give it a period of 2 weeks- without meds. Then CALL me to try Keppra tiny dose- 250 mg nightly- then can increase to 250 mg 2x/day if tolerated. Went over can cause irritability 3-5%   5.   Needs to get me to take over Norco and Valium  for flares.  - give me a call when needs Norco and Valium  and I can take over- will need opiate contract.   6.  Just refilled Duloxetine , so doesn't need refill.   7. F/U in 3 months- but CALL me in 4-6 weeks 2 weeks off meds when you call, my chart message-   I spent a total of 31   minutes on total care today- >50% coordination of care- due to  d/w pt  about optoins Prolonged discussion about options

## 2023-10-22 NOTE — Progress Notes (Unsigned)
 @Patient  ID: Tonya Green, female    DOB: 1951/04/03, 73 y.o.   MRN: [id number]  No chief complaint on file.   Referring provider: Valli Gaw, MD  HPI: 33-y o woman for FU of OSA and chronic cough   OSA was diagnosed in 2014 and  maintained on CPAP since then.  She received a new CPAP in 2020 when she saw Dr. Gloria Lares.  She has settled down with nasal mask, [employer]   PMH -Factor 5 Leyden carrier history of right lower extremity DVT, daughter has history of PE -Collagenous colitis on oral budesonide     47-month follow-up visit. Initial office visit 05/2022 -impression was upper airway cough syndrome.  No evidence of reflux or asthma While she was taking Chrlorpheniramine at night and Phenylephrine in the morning , coughing decreased and she was able to sleep better.  Cough returned when she stopped taking this medication. She has been taking this intermittently.  She denies wheezing, sputum production or fevers.  No significant reflux symptoms. She took oral budesonide for a month for collagenous colitis.  She remains on gabapentin    Very compliant with CPAP denies any problems with mask or pressure.  We changed her settings on last office visit to 5 to 12 cm   Chest x-ray 03/2022 showed left basilar linear atelectasis    10/23/2023- Interim hx  Discussed the use of AI scribe software for clinical note transcription with the patient, who gave verbal consent to proceed.  History of Present Illness   Tonya Green is a 73 year old female with sleep apnea and chronic upper airway cough who presents for follow-up. She is accompanied by her husband, Mr. Weyrauch.  She has been using her CPAP machine consistently every night for an average of 7 hours and 52 minutes over the last 90 days, with auto pressure settings between 5 and 12. Her apnea score is 1.1, indicating effective control of her sleep apnea.  She describes a persistent cough that resembles a smoker's cough, despite never having  smoked. The sensation is as if something is stuck in her throat, and she questions if previous intubation may have altered her trachea. Cough is non-productive. No associated wheezing noted. Chlorphenamine has provided some relief, but a trial of Qvar  was ineffective. She has not used Flonase  or Allegra  consistently this year, only taking Allegra  once this spring.  A CTA CHEST scan from May of last year revealed two small nodules in the right upper lobe, measuring 4 and 5 millimeters. She questions if these nodules could be related to her cough. She is a never smoker.   She experiences occasional shortness of breath, which she attributes to irregular heartbeats rather than pulmonary issues. She has a history of a blood clot and factor V Leiden mutation, for which she takes nattokinase. Additionally, she takes gabapentin  300 mg three times a day for a nerve injury.  No wheezing, chest tightness, or reflux symptoms such as heartburn, burping, or belching.     Significant tests/ events reviewed split-PSG 2014 >> mild obstructive sleep apnea with an AHI of 11.9, optimal control on 6 cm H2O   10/20/22 CTA>>  4 mm anterior right upper lobe nodule on 79/10 with adjacent 5 mm nodule on 78/10. 4 mm right lower lobe nodule identified on 77/10. Atelectasis or scarring noted in the posterior lingula and both dependent lung bases  11/28/22 CT coronary >> Mild, stable lingular and posterior bilateral lower lobe scarring and/or atelectasis is seen. A stable  5 mm anteromedial right upper lobe lung nodule is seen   Allergies  Allergen Reactions   Macrobid [Nitrofurantoin Macrocrystal] Shortness Of Breath and Nausea Only   Shellfish Allergy Diarrhea and Nausea And Vomiting    Lobster only.  Causes food poisoning like symptoms   Statins Other (See Comments)    DIZZINESS    Sulfa Antibiotics     Swelling      Immunization History  Administered Date(s) Administered   Fluad Quad(high Dose 65+) 03/18/2022    Influenza, High Dose Seasonal PF 03/18/2018, 02/01/2019, 02/18/2020, 02/27/2023   Influenza-Unspecified 03/08/2021   Moderna Covid-19 Vaccine Bivalent Booster 64yrs & up 02/07/2021, 11/21/2021   PFIZER Comirnaty(Gray Top)Covid-19 Tri-Sucrose Vaccine 07/10/2020   PFIZER(Purple Top)SARS-COV-2 Vaccination 06/03/2019, 07/01/2019, 01/17/2020, 03/07/2022   Pfizer(Comirnaty)Fall Seasonal Vaccine 12 years and older 02/27/2023   Pneumococcal Conjugate Pcv21, Polysaccharide Crm197 Conjugaf 06/09/2023   RSV IGIV 01/04/2022   Tdap 06/29/2015   Unspecified SARS-COV-2 Vaccination 03/07/2022   Zoster Recombinant(Shingrix) 06/30/2018, 10/06/2018, 11/06/2018    Past Medical History:  Diagnosis Date   Basal cell carcinoma    left nasal sidewall [facility]   Carotid artery stenosis    mild L>R 03/26/16 [place]     Cataract of left eye 05/25/2021   Chronic cough 04/03/2022   Complication of anesthesia    slow to wake   Diverticulosis    colonoscopy 10/13/16 [place]    Factor 5 Leiden mutation, heterozygous (HCC)    Fatty liver    Heterozygous factor V Leiden mutation (HCC) 11/01/2017   History of blood transfusion    birth Rh factor    History of skin cancer 11/01/2017   H/o BCC forehead and SCC nose    Hyperlipidemia    Hypertension    Hyponatremia 12/18/2017   Leukocytosis 12/18/2017   OSA on CPAP    Radiculopathy    Right leg DVT (HCC)    2010 s/p back surgery was on Coumadin x 6 months    Thyroid  disease    hypothyroidism    UTI (urinary tract infection) 08/21/2018    Tobacco History: Social History   Tobacco Use  Smoking Status Never  Smokeless Tobacco Never   Counseling given: Not Answered   Outpatient Medications Prior to Visit  Medication Sig Dispense Refill   amLODipine  (NORVASC ) 2.5 MG tablet Take 1 tablet (2.5 mg total) by mouth daily. In am if BP>140/?90 take another tablet 180 tablet 3   Ascorbic Acid (VITAMIN C) 1000 MG tablet  Take 1,000 mg by mouth 3 (three) times daily.     b complex vitamins capsule Take 1 capsule by mouth daily. B-12, b-6, folate     Cholecalciferol (VITAMIN D3) 5000 units CAPS Take by mouth.     diazepam  (VALIUM ) 2 MG tablet TAKE 1/2 TO 1 TABLET BY MOUTH EVERY NIGHT AT BEDTIME AS NEEDED 30 tablet 0   DULoxetine  (CYMBALTA ) 20 MG capsule Take 2 capsules (40 mg total) by mouth daily. X  1week, then 40 mg daily - for nerve pain- after first month, cannot tolerate 60 mg 60 capsule 3   fexofenadine  (ALLEGRA  ALLERGY) 180 MG tablet Take 1 tablet (180 mg total) by mouth daily. 90 tablet 3   fluticasone  (FLONASE ) 50 MCG/ACT nasal spray Place 2 sprays into both nostrils daily. 16 g 6   gabapentin  (NEURONTIN ) 300 MG capsule Take 1 capsule (300 mg total) by mouth 3 (three) times daily. 270 capsule 3   HYDROcodone -acetaminophen  (NORCO/VICODIN) 5-325 MG tablet Take 1  tablet by mouth 2 (two) times daily as needed (only for flare ups). 30 tablet 0   hydrocortisone  2.5 % ointment Apply topically 2 (two) times daily. Apply to affected area two times a day as needed for 5 days 30 g 0   levothyroxine  (SYNTHROID ) 50 MCG tablet Take 1 tablet (50 mcg total) by mouth daily before breakfast. 90 tablet 3   MAGNESIUM MALATE PO Take 375 mg by mouth.     Multiple Vitamins-Minerals (PRESERVISION AREDS PO) Take by mouth daily.     Nattokinase 100 MG CAPS Take by mouth daily. 2000 iu     ondansetron  (ZOFRAN ) 4 MG tablet Take 1 tablet (4 mg total) by mouth every 8 (eight) hours as needed for nausea or vomiting. (Patient not taking: Reported on 10/14/2023) 90 tablet 1   OVER THE COUNTER MEDICATION Take 1,500 mg by mouth 3 (three) times daily. [place]     Strontium Chloride POWD Use as directed 250 mg in the mouth or throat.     TURMERIC PO Take 2,250 mg by mouth daily.     No facility-administered medications prior to visit.      Review of Systems  Review of Systems  Constitutional: Negative.   HENT: Negative.     Respiratory:  Positive for cough. Negative for chest tightness and wheezing.        Occasional sob   Cardiovascular: Negative.  Negative for chest pain and palpitations.     Physical Exam  There were no vitals taken for this visit. Physical Exam Constitutional:      Appearance: Normal appearance.  HENT:     Head: Normocephalic and atraumatic.     Right Ear: Tympanic membrane normal. There is no impacted cerumen.     Left Ear: Tympanic membrane normal. There is no impacted cerumen.     Mouth/Throat:     Mouth: Mucous membranes are moist.     Pharynx: Oropharynx is clear.  Cardiovascular:     Rate and Rhythm: Normal rate and regular rhythm.  Pulmonary:     Effort: Pulmonary effort is normal.     Breath sounds: Normal breath sounds. No wheezing, rhonchi or rales.  Musculoskeletal:     Cervical back: Normal range of motion and neck supple.  Neurological:     General: No focal deficit present.     Mental Status: She is alert and oriented to person, place, and time. Mental status is at baseline.  Psychiatric:        Mood and Affect: Mood normal.        Behavior: Behavior normal.        Thought Content: Thought content normal.        Judgment: Judgment normal.      Lab Results:  CBC    Component Value Date/Time   WBC 5.3 10/20/2022 0812   RBC 4.31 10/20/2022 0812   HGB 13.0 10/20/2022 0812   HCT 40.6 10/20/2022 0812   PLT 253 10/20/2022 0812   MCV 94.2 10/20/2022 0812   MCH 30.2 10/20/2022 0812   MCHC 32.0 10/20/2022 0812   RDW 13.0 10/20/2022 0812   LYMPHSABS 2.4 09/24/2022 0748   MONOABS 0.5 09/24/2022 0748   EOSABS 0.2 09/24/2022 0748   BASOSABS 0.1 09/24/2022 0748    BMET    Component Value Date/Time   NA 139 05/15/2023 0847   K 4.2 05/15/2023 0847   CL 104 05/15/2023 0847   CO2 27 05/15/2023 0847   GLUCOSE 98 05/15/2023 0847  BUN 15 05/15/2023 0847   CREATININE 0.69 05/15/2023 0847   CALCIUM  9.4 05/15/2023 0847   GFRNONAA >60 10/20/2022 0812    GFRAA >60 12/10/2017 1133    BNP No results found for: "BNP"  ProBNP No results found for: "PROBNP"  Imaging: No results found.   Assessment & Plan:   No problem-specific Assessment & Plan notes found for this encounter.  Assessment and Plan    Chronic Cough Chronic upper airway cough persists, likely related to postnasal drip or allergies. No history of asthma. Previous use of Qvar  was ineffective. Chlorphenamine provided some relief, suggesting an allergic component. Patient has never smoked, no evidence of emphysema on CT imaging.  Differential includes postnasal drip and reflux, though reflux is unlikely given the absence of heartburn or other symptoms. Gabapentin  is already being used for a nerve injury and may also help with cough. No pulmonary function testing done, patient does not wish to purse further testing at this time.  - Recommend daily use of Allegra  and Flonase  for 4-6 weeks. - Consider ENT referral for laryngoscopy if cough persists. - Monitor response to antihistamine therapy and adjust plan accordingly.  Obstructive Sleep Apnea Obstructive sleep apnea is well-controlled with CPAP therapy. Current pressure autp settings 5-12cm h20. Usage is consistent at 100% with an average of 7 hours and 52 minutes per night. Apnea score is 1.1, indicating well-controlled apnea.  Pulmonary Nodules Several right pulmonary nodules measuring up to 5 mm were identified on CTA in May 2024. No follow-up was deemed necessary for low-risk nodules. Nodules are not typically associated with cough.   Antonio Baumgarten, NP 10/22/2023

## 2023-10-23 ENCOUNTER — Encounter (HOSPITAL_BASED_OUTPATIENT_CLINIC_OR_DEPARTMENT_OTHER): Payer: Self-pay | Admitting: Primary Care

## 2023-10-23 ENCOUNTER — Ambulatory Visit

## 2023-10-23 ENCOUNTER — Ambulatory Visit (INDEPENDENT_AMBULATORY_CARE_PROVIDER_SITE_OTHER): Admitting: Primary Care

## 2023-10-23 VITALS — BP 134/82 | HR 73 | Ht 65.0 in | Wt 184.7 lb

## 2023-10-23 DIAGNOSIS — R053 Chronic cough: Secondary | ICD-10-CM

## 2023-10-23 DIAGNOSIS — R918 Other nonspecific abnormal finding of lung field: Secondary | ICD-10-CM

## 2023-10-23 DIAGNOSIS — G4733 Obstructive sleep apnea (adult) (pediatric): Secondary | ICD-10-CM

## 2023-10-23 NOTE — Patient Instructions (Signed)
 -  CHRONIC COUGH: Your chronic cough is likely due to postnasal drip or allergies. We recommend you start using Allegra  and Flonase  daily for 4-6 weeks to see if it helps. If your cough does not improve, we may refer you to an ENT specialist for further evaluation.  -OBSTRUCTIVE SLEEP APNEA: Your obstructive sleep apnea is well-controlled with your CPAP machine, which you are using consistently every night. Your apnea score indicates effective management of your condition.  -PULMONARY NODULES: The small nodules in your right lung, identified in a previous CT scan, are considered low-risk and do not require follow-up. These nodules are not typically associated with your cough.  -BLOOD CLOT: Your history of a blood clot and factor V Leiden mutation is being managed with nattokinase, which helps to prevent further clots.  INSTRUCTIONS:  Please start taking Allegra  and Flonase  daily for the next 4-6 weeks to help with your cough. If your symptoms do not improve, we will consider referring you to an ENT specialist for further evaluation. Continue using your CPAP machine as you have been, as it is effectively managing your sleep apnea. No follow-up is needed for the pulmonary nodules at this time. If you experience any new or worsening symptoms, please contact our office.  Follow-up 1 year with Dr. Alva or sooner if needed

## 2023-10-31 ENCOUNTER — Encounter: Payer: Self-pay | Admitting: Physical Medicine and Rehabilitation

## 2023-10-31 DIAGNOSIS — G8929 Other chronic pain: Secondary | ICD-10-CM

## 2023-10-31 MED ORDER — HYDROCODONE-ACETAMINOPHEN 5-325 MG PO TABS: 1.0000 | ORAL_TABLET | Freq: Two times a day (BID) | ORAL | 0 refills | Status: DC | PRN

## 2023-10-31 NOTE — Telephone Encounter (Signed)
 Emotional upheaval with coming off Duloxetine -  and also 1 week+ of diarrhea.  Zofran  finally helped get rid of diarrhea.   Finally grieved loss- of losing function with this lumbar radiculopathy-   Also a new unmanageable flare overnight.   Back up to 5000 steps/day- compared to 10k/day.      Will send in Norco- needs to get- last dose given 02/04/23.   Doesn't need more Valium .   Asked pt to call me again by tomorrow if no resolution.  And we can try Steroids.

## 2023-11-01 ENCOUNTER — Telehealth: Payer: Self-pay

## 2023-11-01 NOTE — Telephone Encounter (Addendum)
 PA for Hydrocodone  5-325 request received from [facility] today. Pharmacy stated, patient my get the 7 day supply today. Then try for remainder or she may need a new script sent, after the 7 day supply is completed.   I will try to submit the PA today to [facility].   Rx approved [id number]- GOOD 11/01/2023 TO 12/01/2023  OPTIUM RX

## 2023-11-16 ENCOUNTER — Encounter (HOSPITAL_BASED_OUTPATIENT_CLINIC_OR_DEPARTMENT_OTHER): Payer: Self-pay

## 2023-12-31 ENCOUNTER — Ambulatory Visit: Admitting: Internal Medicine

## 2024-01-14 ENCOUNTER — Encounter: Payer: Self-pay | Admitting: Internal Medicine

## 2024-01-14 ENCOUNTER — Ambulatory Visit (INDEPENDENT_AMBULATORY_CARE_PROVIDER_SITE_OTHER): Admitting: Internal Medicine

## 2024-01-14 VITALS — BP 154/90 | HR 78 | Ht 65.0 in | Wt 187.8 lb

## 2024-01-14 DIAGNOSIS — M5416 Radiculopathy, lumbar region: Secondary | ICD-10-CM

## 2024-01-14 DIAGNOSIS — E782 Mixed hyperlipidemia: Secondary | ICD-10-CM

## 2024-01-14 DIAGNOSIS — H8113 Benign paroxysmal vertigo, bilateral: Secondary | ICD-10-CM | POA: Diagnosis not present

## 2024-01-14 DIAGNOSIS — E038 Other specified hypothyroidism: Secondary | ICD-10-CM

## 2024-01-14 DIAGNOSIS — I10 Essential (primary) hypertension: Secondary | ICD-10-CM | POA: Diagnosis not present

## 2024-01-14 DIAGNOSIS — K529 Noninfective gastroenteritis and colitis, unspecified: Secondary | ICD-10-CM | POA: Diagnosis not present

## 2024-01-14 DIAGNOSIS — E559 Vitamin D deficiency, unspecified: Secondary | ICD-10-CM

## 2024-01-14 DIAGNOSIS — G4733 Obstructive sleep apnea (adult) (pediatric): Secondary | ICD-10-CM

## 2024-01-14 MED ORDER — MECLIZINE HCL 25 MG PO TABS: 25.0000 mg | ORAL_TABLET | Freq: Three times a day (TID) | ORAL | 0 refills | Status: AC | PRN

## 2024-01-14 NOTE — Progress Notes (Signed)
 New Patient Office Visit  Subjective   Patient ID: Tonya Green, female    DOB: 25-Sep-1950  Age: 73 y.o. MRN: [id number]  CC:  Chief Complaint  Patient presents with   Establish Care    NPE    HPI Tonya Green presents to establish care Previous Primary Care provider/office:   she does not have additional concerns to discuss today.   Patient comes in to establish PMD.  She has several medical issues as listed in her chart.  She brings in her home blood pressure readings which are fluctuating with some high readings.  Currently she is only taking Norvasc  2.5 mg/day, and some days she takes an extra pill.  Patient advised to start taking 5 mg daily to keep a ceiling on her blood pressure. Today she is complaining of dizziness, she has a history of positional vertigo and has a prescription for meclizine .  She is also experiencing some nausea with it.  On exam her left TM is opaque with some fluid behind it.  There is no redness.  She does have a prescription for Flonase  and Allegra  which is not currently taking advised to start taking it as well as meclizine . Her LDL has been very high, but she could not take Lipitor in the past.  Will check again and see if we can try a different statin. Patient is fasting for blood work. She is up-to-date on her mammogram and colonoscopy. Will schedule DEXA at her next visit.     Outpatient Encounter Medications as of 01/14/2024  Medication Sig   amLODipine  (NORVASC ) 2.5 MG tablet Take 1 tablet (2.5 mg total) by mouth daily. In am if BP>140/?90 take another tablet   Ascorbic Acid (VITAMIN C) 1000 MG tablet Take 1,000 mg by mouth 3 (three) times daily.   b complex vitamins capsule Take 1 capsule by mouth daily. B-12, b-6, folate   Cholecalciferol (VITAMIN D3) 5000 units CAPS Take by mouth.   Coenzyme Q10-Fish Oil-Vit E (CO-Q 10 OMEGA-3 FISH OIL PO) Take by mouth.   diazepam  (VALIUM ) 2 MG tablet TAKE 1/2 TO 1 TABLET BY MOUTH EVERY NIGHT AT BEDTIME AS  NEEDED   gabapentin  (NEURONTIN ) 300 MG capsule Take 1 capsule (300 mg total) by mouth 3 (three) times daily.   HYDROcodone -acetaminophen  (NORCO/VICODIN) 5-325 MG tablet Take 1 tablet by mouth 2 (two) times daily as needed (only for flare ups).   levothyroxine  (SYNTHROID ) 50 MCG tablet Take 1 tablet (50 mcg total) by mouth daily before breakfast.   MAGNESIUM MALATE PO Take 375 mg by mouth.   meclizine  (ANTIVERT ) 25 MG tablet Take 1 tablet (25 mg total) by mouth 3 (three) times daily as needed for dizziness.   Multiple Vitamins-Minerals (PRESERVISION AREDS 2 PO) Take by mouth.   Multiple Vitamins-Minerals (PRESERVISION AREDS PO) Take by mouth daily.   Nattokinase 100 MG CAPS Take by mouth daily. 2000 iu (Patient taking differently: Take 2,000 mg by mouth daily. 2000 iu)   ondansetron  (ZOFRAN ) 4 MG tablet Take 1 tablet (4 mg total) by mouth every 8 (eight) hours as needed for nausea or vomiting.   OVER THE COUNTER MEDICATION Take 1,500 mg by mouth 3 (three) times daily. [place]   Strontium Chloride POWD Use as directed 250 mg in the mouth or throat.   TURMERIC PO Take 2,250 mg by mouth daily.   DULoxetine  (CYMBALTA ) 20 MG capsule Take 2 capsules (40 mg total) by mouth daily. X  1week, then 40 mg daily - for nerve  pain- after first month, cannot tolerate 60 mg (Patient not taking: Reported on 01/14/2024)   fexofenadine  (ALLEGRA  ALLERGY) 180 MG tablet Take 1 tablet (180 mg total) by mouth daily. (Patient not taking: Reported on 01/14/2024)   fluticasone  (FLONASE ) 50 MCG/ACT nasal spray Place 2 sprays into both nostrils daily. (Patient not taking: Reported on 01/14/2024)   hydrocortisone  2.5 % ointment Apply topically 2 (two) times daily. Apply to affected area two times a day as needed for 5 days (Patient not taking: Reported on 01/14/2024)   No facility-administered encounter medications on file as of 01/14/2024.    Past Medical History:  Diagnosis Date   Basal cell carcinoma    left nasal sidewall  [facility]   Carotid artery stenosis    mild L>R 03/26/16 [place]     Cataract of left eye 05/25/2021   Chronic cough 04/03/2022   Complication of anesthesia    slow to wake   Diverticulosis    colonoscopy 10/13/16 [place]    Factor 5 Leiden mutation, heterozygous (HCC)    Fatty liver    Heterozygous factor V Leiden mutation (HCC) 11/01/2017   History of blood transfusion    birth Rh factor    History of skin cancer 11/01/2017   H/o BCC forehead and SCC nose    Hyperlipidemia    Hypertension    Hyponatremia 12/18/2017   Leukocytosis 12/18/2017   OSA on CPAP    Radiculopathy    Right leg DVT (HCC)    2010 s/p back surgery was on Coumadin x 6 months    Thyroid  disease    hypothyroidism    UTI (urinary tract infection) 08/21/2018    Past Surgical History:  Procedure Laterality Date   APPENDECTOMY     2000   BACK SURGERY     BREAST EXCISIONAL BIOPSY Left 2005   for infection   BREAST SURGERY     biospy ? year    CATARACT EXTRACTION W/PHACO Left 02/11/2023   Procedure: CATARACT EXTRACTION PHACO AND INTRAOCULAR LENS PLACEMENT (IOC) LEFT  CLAREON VIVITY TORIC LENS 2.60 00:26.2;  Surgeon: Myrna Adine Anes, MD;  Location: [facility];  Service: Ophthalmology;  Laterality: Left;   CATARACT EXTRACTION W/PHACO Right 03/25/2023   Procedure: CATARACT EXTRACTION PHACO AND INTRAOCULAR LENS PLACEMENT (IOC) RIGHT  CLAREON VIVITY TORIC LENS;  Surgeon: Myrna Adine Anes, MD;  Location: [facility];  Service: Ophthalmology;  Laterality: Right;  3.34 0:33.7   COLONOSCOPY     COLONOSCOPY WITH PROPOFOL  N/A 01/01/2022   Procedure: COLONOSCOPY WITH PROPOFOL ;  Surgeon: Maryruth Ole DASEN, MD;  Location: [facility];  Service: Endoscopy;  Laterality: N/A;   MOHS SURGERY  05/11/2019   left nasal side wall bcc Dr. Lloyd in [place]    ruptured disc repair     L4/5 10/2008 with complications residual right leg weakness/reduced sensation and DVT in 2010  post surgery     Family History  Problem Relation Age of Onset   Hypertension Mother    Stroke Mother        in 68s    Heart disease Father    Stroke Father    Other Daughter        chronic back pain nerve sheath abnormal in spine s/p surgery   Liver cancer Paternal Aunt    Stroke Paternal Grandfather    Stroke Brother    Esophageal cancer Brother    Breast cancer Other 37       niece  Hyperlipidemia Other     Social History   Socioeconomic History   Marital status: Married    Spouse name: Not on file   Number of children: 2   Years of education: Not on file   Highest education level: Not on file  Occupational History   Occupation: Retired  Tobacco Use   Smoking status: Never   Smokeless tobacco: Never  Vaping Use   Vaping status: Never Used  Substance and Sexual Activity   Alcohol use: Yes    Comment: occ   Drug use: Yes    Types: Hydrocodone     Comment: PRN for pain flare ups   Sexual activity: Not on file  Other Topics Concern   Not on file  Social History Narrative   BSN CCM, RN   Moved from [place]  recently    2 daughters    Retired and married    No guns, wears seat belt, safe in relationship    Lives at [employer]    Social Drivers of Health   Financial Resource Strain: Low Risk  (10/17/2022)   Overall Financial Resource Strain (CARDIA)    Difficulty of Paying Living Expenses: Not hard at all  Food Insecurity: No Food Insecurity (10/30/2022)   Hunger Vital Sign    Worried About Running Out of Food in the Last Year: Never true    Ran Out of Food in the Last Year: Never true  Transportation Needs: No Transportation Needs (10/30/2022)   PRAPARE - Administrator, Civil Service (Medical): No    Lack of Transportation (Non-Medical): No  Physical Activity: Sufficiently Active (10/17/2022)   Exercise Vital Sign    Days of Exercise per Week: 5 days    Minutes of Exercise per Session: 30 min  Stress: No Stress Concern Present (10/17/2022)    [employer] of Occupational Health - Occupational Stress Questionnaire    Feeling of Stress : Not at all  Social Connections: Moderately Integrated (10/17/2022)   Social Connection and Isolation Panel    Frequency of Communication with Friends and Family: More than three times a week    Frequency of Social Gatherings with Friends and Family: More than three times a week    Attends Religious Services: Never    Database administrator or Organizations: Yes    Attends Engineer, structural: More than 4 times per year    Marital Status: Married  Catering manager Violence: Not At Risk (10/30/2022)   Humiliation, Afraid, Rape, and Kick questionnaire    Fear of Current or Ex-Partner: No    Emotionally Abused: No    Physically Abused: No    Sexually Abused: No    Review of Systems  Constitutional: Negative.  Negative for chills, diaphoresis, fever, malaise/fatigue and weight loss.  HENT:  Positive for ear pain. Negative for sinus pain.   Eyes: Negative.   Respiratory: Negative.  Negative for cough, shortness of breath, wheezing and stridor.   Cardiovascular: Negative.  Negative for chest pain, palpitations and leg swelling.  Gastrointestinal: Negative.  Negative for abdominal pain, constipation, diarrhea, heartburn, nausea and vomiting.  Genitourinary: Negative.  Negative for dysuria and flank pain.  Musculoskeletal: Negative.  Negative for joint pain and myalgias.  Skin: Negative.   Neurological:  Positive for dizziness. Negative for headaches.  Endo/Heme/Allergies: Negative.   Psychiatric/Behavioral: Negative.  Negative for depression and suicidal ideas. The patient is not nervous/anxious.         Objective   BP (!) 154/90  Pulse 78   Ht 5' 5 (1.651 m)   Wt 187 lb 12.8 oz (85.2 kg)   SpO2 94%   BMI 31.25 kg/m   Physical Exam Vitals and nursing note reviewed.  Constitutional:      Appearance: Normal appearance.  HENT:     Head: Normocephalic and atraumatic.      Right Ear: A middle ear effusion is present. Tympanic membrane is not injected.     Left Ear: A middle ear effusion is present. Tympanic membrane is not injected.     Nose: Nose normal.     Mouth/Throat:     Mouth: Mucous membranes are moist.     Pharynx: Oropharynx is clear.  Eyes:     Conjunctiva/sclera: Conjunctivae normal.     Pupils: Pupils are equal, round, and reactive to light.  Cardiovascular:     Rate and Rhythm: Normal rate and regular rhythm.     Pulses: Normal pulses.     Heart sounds: Normal heart sounds. No murmur heard. Pulmonary:     Effort: Pulmonary effort is normal.     Breath sounds: Normal breath sounds. No wheezing.  Abdominal:     General: Bowel sounds are normal.     Palpations: Abdomen is soft.     Tenderness: There is no abdominal tenderness. There is no right CVA tenderness or left CVA tenderness.  Musculoskeletal:        General: Normal range of motion.     Cervical back: Normal range of motion.     Right lower leg: No edema.     Left lower leg: No edema.  Skin:    General: Skin is warm and dry.  Neurological:     General: No focal deficit present.     Mental Status: She is alert and oriented to person, place, and time.  Psychiatric:        Mood and Affect: Mood normal.        Behavior: Behavior normal.        Assessment & Plan:  Check labs today.  Start Flonase , antihistamine and meclizine . Increase amlodipine  to 5 mg/day and monitor blood pressure. Will schedule DEXA at follow-up. Problem List Items Addressed This Visit     OSA on CPAP   Hyperlipidemia   Relevant Orders   Lipid Panel w/o Chol/HDL Ratio   Hypothyroidism   Relevant Orders   TSH+T4F+T3Free   Lumbar radiculopathy   Vitamin D  deficiency   Relevant Orders   Vitamin D  (25 hydroxy)   Other Visit Diagnoses       Benign paroxysmal positional vertigo due to bilateral vestibular disorder    -  Primary   Relevant Medications   meclizine  (ANTIVERT ) 25 MG tablet      Colitis       Relevant Orders   CBC with Diff     Essential hypertension, benign       Relevant Orders   CMP14+EGFR       Return in about 2 weeks (around 01/28/2024).   Total time spent: 30 minutes  FERNAND FREDY RAMAN, MD  01/14/2024   This document may have been prepared by [facility] Voice Recognition software and as such may include unintentional dictation errors.

## 2024-01-15 LAB — LIPID PANEL W/O CHOL/HDL RATIO
Cholesterol, Total: 361 mg/dL — ABNORMAL HIGH (ref 100–199)
HDL: 42 mg/dL (ref >39)
LDL Chol Calc (NIH): 265 mg/dL — ABNORMAL HIGH (ref 0–99)
Triglycerides: 251 mg/dL — ABNORMAL HIGH (ref 0–149)
VLDL Cholesterol Cal: 54 mg/dL — ABNORMAL HIGH (ref 5–40)

## 2024-01-15 LAB — CBC WITH DIFFERENTIAL/PLATELET
Basophils Absolute: 0.1 x10E3/uL (ref 0.0–0.2)
Basos: 1 %
EOS (ABSOLUTE): 0.1 x10E3/uL (ref 0.0–0.4)
Eos: 1 %
Hematocrit: 42.5 % (ref 34.0–46.6)
Hemoglobin: 13.7 g/dL (ref 11.1–15.9)
Immature Grans (Abs): 0 x10E3/uL (ref 0.0–0.1)
Immature Granulocytes: 0 %
Lymphocytes Absolute: 2.6 x10E3/uL (ref 0.7–3.1)
Lymphs: 37 %
MCH: 30.6 pg (ref 26.6–33.0)
MCHC: 32.2 g/dL (ref 31.5–35.7)
MCV: 95 fL (ref 79–97)
Monocytes Absolute: 0.4 x10E3/uL (ref 0.1–0.9)
Monocytes: 6 %
Neutrophils Absolute: 3.8 x10E3/uL (ref 1.4–7.0)
Neutrophils: 55 %
Platelets: 291 x10E3/uL (ref 150–450)
RBC: 4.47 x10E6/uL (ref 3.77–5.28)
RDW: 12.8 % (ref 11.7–15.4)
WBC: 6.9 x10E3/uL (ref 3.4–10.8)

## 2024-01-15 LAB — CMP14+EGFR
ALT: 45 IU/L — ABNORMAL HIGH (ref 0–32)
AST: 26 IU/L (ref 0–40)
Albumin: 4.6 g/dL (ref 3.8–4.8)
Alkaline Phosphatase: 99 IU/L (ref 44–121)
BUN/Creatinine Ratio: 17 (ref 12–28)
BUN: 13 mg/dL (ref 8–27)
Bilirubin Total: 0.4 mg/dL (ref 0.0–1.2)
CO2: 22 mmol/L (ref 20–29)
Calcium: 9.8 mg/dL (ref 8.7–10.3)
Chloride: 104 mmol/L (ref 96–106)
Creatinine, Ser: 0.77 mg/dL (ref 0.57–1.00)
Globulin, Total: 3.3 g/dL (ref 1.5–4.5)
Glucose: 113 mg/dL — ABNORMAL HIGH (ref 70–99)
Potassium: 4.1 mmol/L (ref 3.5–5.2)
Sodium: 141 mmol/L (ref 134–144)
Total Protein: 7.9 g/dL (ref 6.0–8.5)
eGFR: 81 mL/min/1.73 (ref >59)

## 2024-01-15 LAB — TSH+T4F+T3FREE
Free T4: 1.4 ng/dL (ref 0.82–1.77)
T3, Free: 2.5 pg/mL (ref 2.0–4.4)
TSH: 1.93 u[IU]/mL (ref 0.450–4.500)

## 2024-01-15 LAB — VITAMIN D 25 HYDROXY (VIT D DEFICIENCY, FRACTURES): Vit D, 25-Hydroxy: 27.1 ng/mL — ABNORMAL LOW (ref 30.0–100.0)

## 2024-01-16 ENCOUNTER — Ambulatory Visit: Payer: Self-pay | Admitting: Internal Medicine

## 2024-01-16 DIAGNOSIS — R7303 Prediabetes: Secondary | ICD-10-CM

## 2024-01-16 DIAGNOSIS — E782 Mixed hyperlipidemia: Secondary | ICD-10-CM

## 2024-01-16 DIAGNOSIS — E039 Hypothyroidism, unspecified: Secondary | ICD-10-CM

## 2024-01-16 DIAGNOSIS — I10 Essential (primary) hypertension: Secondary | ICD-10-CM

## 2024-01-17 ENCOUNTER — Encounter: Attending: Physical Medicine and Rehabilitation | Admitting: Physical Medicine and Rehabilitation

## 2024-01-17 ENCOUNTER — Encounter: Payer: Self-pay | Admitting: Physical Medicine and Rehabilitation

## 2024-01-17 VITALS — BP 162/84 | HR 83 | Ht 65.0 in | Wt 189.0 lb

## 2024-01-17 DIAGNOSIS — G8929 Other chronic pain: Secondary | ICD-10-CM | POA: Diagnosis present

## 2024-01-17 DIAGNOSIS — Z5181 Encounter for therapeutic drug level monitoring: Secondary | ICD-10-CM | POA: Insufficient documentation

## 2024-01-17 DIAGNOSIS — R635 Abnormal weight gain: Secondary | ICD-10-CM | POA: Diagnosis present

## 2024-01-17 DIAGNOSIS — M5440 Lumbago with sciatica, unspecified side: Secondary | ICD-10-CM | POA: Diagnosis not present

## 2024-01-17 DIAGNOSIS — E559 Vitamin D deficiency, unspecified: Secondary | ICD-10-CM | POA: Diagnosis present

## 2024-01-17 DIAGNOSIS — G629 Polyneuropathy, unspecified: Secondary | ICD-10-CM | POA: Diagnosis present

## 2024-01-17 DIAGNOSIS — M62838 Other muscle spasm: Secondary | ICD-10-CM | POA: Insufficient documentation

## 2024-01-17 MED ORDER — HYDROCODONE-ACETAMINOPHEN 5-325 MG PO TABS: 1.0000 | ORAL_TABLET | Freq: Two times a day (BID) | ORAL | 0 refills | Status: DC | PRN

## 2024-01-17 MED ORDER — DIAZEPAM 2 MG PO TABS: ORAL_TABLET | ORAL | 5 refills | Status: AC

## 2024-01-17 NOTE — Progress Notes (Signed)
 Subjective:    Patient ID: Tonya Green, female    DOB: 08-08-1950, 73 y.o.   MRN: [id number]  HPI  Patient is a 73 yr old female with hx of CAD, HTN, hx of DVT 2010 after back surgery- R-L4-5 Microdisectomy for Herniated disc - ; HLD, Factor V Leidin Mutation, osteopenia-  Hypothyroidism- last TSH 2.02 Here for f/u on flares of back pain .    Things much better off the Duloxetine - was terrible.  Nightmares and severe depression.  But didn't help nerve pain.   Has to go back in 2 weeks to see new MD- for PCP.   Things stable/before the medication,  Having dizziness- pressure on ear drum-prescribed Meclizine - BP was too high- also took Allegra - and Norvasc  increased to 5 mg-  HR usually 60s- went up to 90's So reduced Norvasc  to 2.5--  Trying to be cooperative and do what's necessary.   Flares are occurring but less pain when takes meds more quickly.   Every couple of weeks- but getting under control faster- and keeping pain under control.   Only takes 1/2 Norco sometimes-  but if it gets out of control, then needs Valium , which will put her asleep.    Trying to take meds as soon as starts a flare- can stop it fairly quickly.   If catches it really fast-  feels better within an hour-  used to take Advil- cannot take anymore- collagenous colitis from NSAIDs, so cannot take anymore   Has own PT HEP and does yoga-  Getting back to routine with walking- has gained weight since wasn't walking as well.   Stores some everywhere- so can take it really fast.   Blood sugars 140s- when wasn't like that before- usually less than 100- probably because hasn't been walking.   Pain Inventory Average Pain 2 Pain Right Now 2 My pain is constant and tingling  In the last 24 hours, has pain interfered with the following? General activity 0 Relation with others 0 Enjoyment of life 0 What TIME of day is your pain at its worst? varies Sleep (in general) Poor  Pain is worse with: standing Pain  improves with: pacing activities and medication Relief from Meds: 9  Family History  Problem Relation Age of Onset   Hypertension Mother    Stroke Mother        in 40s    Heart disease Father    Stroke Father    Other Daughter        chronic back pain nerve sheath abnormal in spine s/p surgery   Liver cancer Paternal Aunt    Stroke Paternal Grandfather    Stroke Brother    Esophageal cancer Brother    Breast cancer Other 37       niece   Hyperlipidemia Other    Social History   Socioeconomic History   Marital status: Married    Spouse name: Not on file   Number of children: 2   Years of education: Not on file   Highest education level: Not on file  Occupational History   Occupation: Retired  Tobacco Use   Smoking status: Never   Smokeless tobacco: Never  Vaping Use   Vaping status: Never Used  Substance and Sexual Activity   Alcohol use: Yes    Comment: occ   Drug use: Yes    Types: Hydrocodone     Comment: PRN for pain flare ups   Sexual activity: Not on file  Other Topics  Concern   Not on file  Social History Narrative   BSN CCM, RN   Moved from [place]  recently    2 daughters    Retired and married    No guns, wears seat belt, safe in relationship    Lives at [employer]    Social Drivers of Health   Financial Resource Strain: Low Risk  (10/17/2022)   Overall Financial Resource Strain (CARDIA)    Difficulty of Paying Living Expenses: Not hard at all  Food Insecurity: No Food Insecurity (10/30/2022)   Hunger Vital Sign    Worried About Running Out of Food in the Last Year: Never true    Ran Out of Food in the Last Year: Never true  Transportation Needs: No Transportation Needs (10/30/2022)   PRAPARE - Administrator, Civil Service (Medical): No    Lack of Transportation (Non-Medical): No  Physical Activity: Sufficiently Active (10/17/2022)   Exercise Vital Sign    Days of Exercise per Week: 5 days    Minutes of Exercise per Session: 30 min   Stress: No Stress Concern Present (10/17/2022)   [employer] of Occupational Health - Occupational Stress Questionnaire    Feeling of Stress : Not at all  Social Connections: Moderately Integrated (10/17/2022)   Social Connection and Isolation Panel    Frequency of Communication with Friends and Family: More than three times a week    Frequency of Social Gatherings with Friends and Family: More than three times a week    Attends Religious Services: Never    Database administrator or Organizations: Yes    Attends Engineer, structural: More than 4 times per year    Marital Status: Married   Past Surgical History:  Procedure Laterality Date   APPENDECTOMY     2000   BACK SURGERY     BREAST EXCISIONAL BIOPSY Left 2005   for infection   BREAST SURGERY     biospy ? year    CATARACT EXTRACTION W/PHACO Left 02/11/2023   Procedure: CATARACT EXTRACTION PHACO AND INTRAOCULAR LENS PLACEMENT (IOC) LEFT  CLAREON VIVITY TORIC LENS 2.60 00:26.2;  Surgeon: Myrna Adine Anes, MD;  Location: [facility];  Service: Ophthalmology;  Laterality: Left;   CATARACT EXTRACTION W/PHACO Right 03/25/2023   Procedure: CATARACT EXTRACTION PHACO AND INTRAOCULAR LENS PLACEMENT (IOC) RIGHT  CLAREON VIVITY TORIC LENS;  Surgeon: Myrna Adine Anes, MD;  Location: [facility];  Service: Ophthalmology;  Laterality: Right;  3.34 0:33.7   COLONOSCOPY     COLONOSCOPY WITH PROPOFOL  N/A 01/01/2022   Procedure: COLONOSCOPY WITH PROPOFOL ;  Surgeon: Maryruth Ole DASEN, MD;  Location: [facility];  Service: Endoscopy;  Laterality: N/A;   MOHS SURGERY  05/11/2019   left nasal side wall bcc Dr. Lloyd in [place]    ruptured disc repair     L4/5 10/2008 with complications residual right leg weakness/reduced sensation and DVT in 2010 post surgery    Past Surgical History:  Procedure Laterality Date   APPENDECTOMY     2000   BACK SURGERY     BREAST EXCISIONAL BIOPSY Left 2005   for infection   BREAST  SURGERY     biospy ? year    CATARACT EXTRACTION W/PHACO Left 02/11/2023   Procedure: CATARACT EXTRACTION PHACO AND INTRAOCULAR LENS PLACEMENT (IOC) LEFT  CLAREON VIVITY TORIC LENS 2.60 00:26.2;  Surgeon: Myrna Adine Anes, MD;  Location: [facility];  Service: Ophthalmology;  Laterality: Left;   CATARACT EXTRACTION  W/PHACO Right 03/25/2023   Procedure: CATARACT EXTRACTION PHACO AND INTRAOCULAR LENS PLACEMENT (IOC) RIGHT  CLAREON VIVITY TORIC LENS;  Surgeon: Myrna Adine Anes, MD;  Location: [facility];  Service: Ophthalmology;  Laterality: Right;  3.34 0:33.7   COLONOSCOPY     COLONOSCOPY WITH PROPOFOL  N/A 01/01/2022   Procedure: COLONOSCOPY WITH PROPOFOL ;  Surgeon: Maryruth Ole DASEN, MD;  Location: [facility];  Service: Endoscopy;  Laterality: N/A;   MOHS SURGERY  05/11/2019   left nasal side wall bcc Dr. Lloyd in [place]    ruptured disc repair     L4/5 10/2008 with complications residual right leg weakness/reduced sensation and DVT in 2010 post surgery    Past Medical History:  Diagnosis Date   Basal cell carcinoma    left nasal sidewall [facility]   Carotid artery stenosis    mild L>R 03/26/16 [place]     Cataract of left eye 05/25/2021   Chronic cough 04/03/2022   Complication of anesthesia    slow to wake   Diverticulosis    colonoscopy 10/13/16 [place]    Factor 5 Leiden mutation, heterozygous (HCC)    Fatty liver    Heterozygous factor V Leiden mutation (HCC) 11/01/2017   History of blood transfusion    birth Rh factor    History of skin cancer 11/01/2017   H/o BCC forehead and SCC nose    Hyperlipidemia    Hypertension    Hyponatremia 12/18/2017   Leukocytosis 12/18/2017   OSA on CPAP    Radiculopathy    Right leg DVT (HCC)    2010 s/p back surgery was on Coumadin x 6 months    Thyroid  disease    hypothyroidism    UTI (urinary tract infection) 08/21/2018   There were no vitals taken for this visit.  Opioid  Risk Score:   Fall Risk Score:  `1  Depression screen [facility] 2/9     01/14/2024    3:21 PM 07/12/2023    8:47 AM 05/15/2023    8:05 AM 11/12/2022    8:55 AM 10/30/2022   11:20 AM 10/17/2022    2:02 PM 10/01/2022    9:20 AM  Depression screen PHQ 2/9  Decreased Interest 0 1 1 0 0 0 0  Down, Depressed, Hopeless 0 1 1 0 0 0 0  PHQ - 2 Score 0 2 2 0 0 0 0  Altered sleeping 1 3 1  0   1  Tired, decreased energy 0 0 0 0   0  Change in appetite 0 1 1 0   1  Feeling bad or failure about yourself  0 0 0 0   0  Trouble concentrating 0 0 0 0   0  Moving slowly or fidgety/restless 0 0 0 0   0  Suicidal thoughts 0 1 0 0   0  PHQ-9 Score 1 7 4  0   2  Difficult doing work/chores Not difficult at all Not difficult at all Not difficult at all Not difficult at all   Not difficult at all     Review of Systems An entire ROS was completed and found to be negative except for HPI-     Objective:   Physical Exam  Awake, alert, appropriate, accompanied by husband, no assistive device, NAD Not TTP right now because not in flare      Assessment & Plan:   Patient is a 73 yr old female with hx of CAD, HTN, hx of DVT  2010 after back surgery- R-L4-5 Microdisectomy for Herniated disc - ; HLD, Factor V Leidin Mutation, osteopenia-  Hypothyroidism- last TSH 2.02   Need Urine drug screen per office protocol-  will double check to make sure has opiate contract- she did sign contract.   2.  Added Duloxetine  to allergy list- since had such a terrible reaction to it.   3.  Might try Zyrtec instead of Allegra -  and reduce chance of high heart rate.  Take at night time- if it makes you sleepy.    4.   Needs to get back to 2 vitamin D   pills/day-  instead of the one- normal Vit D level 30-100- most doctors aren't used to levels much above low normal- so please take 2 tabs/day.  Can help energy levels and mood sometimes to some extent- can also help pain levels.   5.  Maybe the flares got more often because Vit D  levels went lower.   6.  Handling pain better- with taking 1/2 Norco- as needed.   7. F/U- 3 months- single appt- chronic pain flares.   I spent a total of  31  minutes on total care today- >50% coordination of care- due to  d/w pt about pain meds- options, UDS, and Vit D levels- also changing allergy meds

## 2024-01-17 NOTE — Patient Instructions (Signed)
 Patient is a 73 yr old female with hx of CAD, HTN, hx of DVT 2010 after back surgery- R-L4-5 Microdisectomy for Herniated disc - ; HLD, Factor V Leidin Mutation, osteopenia-  Hypothyroidism- last TSH 2.02   Need Urine drug screen per office protocol-  will double check to make sure has opiate contract- she did sign contract.   2.  Added Duloxetine  to allergy list- since had such a terrible reaction to it.   3.  Might try Zyrtec instead of Allegra -  and reduce chance of high heart rate.  Take at night time- if it makes you sleepy.    4.   Needs to get back to 2 vitamin D   pills/day-  instead of the one- normal Vit D level 30-100- most doctors aren't used to levels much above low normal- so please take 2 tabs/day.  Can help energy levels and mood sometimes to some extent- can also help pain levels.   5.  Maybe the flares got more often because Vit D levels went lower.   6.  Handling pain better- with taking 1/2 Norco- as needed.   7. F/U- 3 months- single appt- chronic pain flares.

## 2024-01-21 LAB — TOXASSURE SELECT,+ANTIDEPR,UR

## 2024-01-23 ENCOUNTER — Telehealth: Payer: Self-pay

## 2024-01-31 ENCOUNTER — Encounter: Payer: Self-pay | Admitting: Internal Medicine

## 2024-01-31 ENCOUNTER — Ambulatory Visit: Admitting: Internal Medicine

## 2024-01-31 VITALS — BP 152/86 | HR 74 | Ht 65.0 in | Wt 192.2 lb

## 2024-01-31 DIAGNOSIS — E782 Mixed hyperlipidemia: Secondary | ICD-10-CM | POA: Diagnosis not present

## 2024-01-31 DIAGNOSIS — Z8249 Family history of ischemic heart disease and other diseases of the circulatory system: Secondary | ICD-10-CM | POA: Insufficient documentation

## 2024-01-31 DIAGNOSIS — E559 Vitamin D deficiency, unspecified: Secondary | ICD-10-CM | POA: Diagnosis not present

## 2024-01-31 DIAGNOSIS — Z789 Other specified health status: Secondary | ICD-10-CM | POA: Insufficient documentation

## 2024-01-31 DIAGNOSIS — R0982 Postnasal drip: Secondary | ICD-10-CM

## 2024-01-31 DIAGNOSIS — I10 Essential (primary) hypertension: Secondary | ICD-10-CM

## 2024-01-31 MED ORDER — NEXLIZET 180-10 MG PO TABS: 1.0000 | ORAL_TABLET | Freq: Every day | ORAL | 1 refills | Status: DC

## 2024-01-31 MED ORDER — FLUTICASONE PROPIONATE 50 MCG/ACT NA SUSP: 2.0000 | Freq: Every day | NASAL | 6 refills | Status: DC

## 2024-01-31 NOTE — Progress Notes (Signed)
 Established Patient Office Visit  Subjective:  Patient ID: Tonya Green, female    DOB: 27-Feb-1951  Age: 73 y.o. MRN: [id number]  Chief Complaint  Patient presents with   Follow-up    2 week follow up    Patient is here today to follow up since increasing her amlodipine  to 5 mg once daily. She states she has been taking amlodipine  2.5 mg in morning and additional 2.5 mg in the afternoon as she has many amlodipine  2.5 mg remaining; encouraged her to just take 2 at the same time in the morning and will reorder amlodipine  5 mg tablets to switch too when she uses the rest of her amlodipine  2.5 mg tablets. Her blood pressure log from home shows average blood pressures of systolic 109-140 and diastolic 70-82. Today in the office her blood pressure is elevated: 152/86.  She reports she is overall doing well and has no new complaints. She still endorses left ear fullness while using Flonase  and Zyretc daily. States the meclizine  has improved her dizziness she was experiencing. Patient asked about her low vitamin D  levels and reports she was taking OTC vitamin D  1000 international units supplements and has recently increased to taking 2000 international units. Patient at this time denied wanting to start prescription strength vitamin D  supplementation.   Patients routine labs showed a high LDL 265, triglycerides of 748, total cholesterol of 361. Patient was intolerance to statins in the past. Patient willing to have CT cardiac calcium  score and try Nexlizet  once daily. Will check routine labs in 3 months to monitor improvement of lipid profile.    No other concerns at this time.   Past Medical History:  Diagnosis Date   Basal cell carcinoma    left nasal sidewall [facility]   Carotid artery stenosis    mild L>R 03/26/16 [place]     Cataract of left eye 05/25/2021   Chronic cough 04/03/2022   Complication of anesthesia    slow to wake   Diverticulosis     colonoscopy 10/13/16 [place]    Factor 5 Leiden mutation, heterozygous (HCC)    Fatty liver    Heterozygous factor V Leiden mutation (HCC) 11/01/2017   History of blood transfusion    birth Rh factor    History of skin cancer 11/01/2017   H/o BCC forehead and SCC nose    Hyperlipidemia    Hypertension    Hyponatremia 12/18/2017   Leukocytosis 12/18/2017   OSA on CPAP    Radiculopathy    Right leg DVT (HCC)    2010 s/p back surgery was on Coumadin x 6 months    Thyroid  disease    hypothyroidism    UTI (urinary tract infection) 08/21/2018    Past Surgical History:  Procedure Laterality Date   APPENDECTOMY     2000   BACK SURGERY     BREAST EXCISIONAL BIOPSY Left 2005   for infection   BREAST SURGERY     biospy ? year    CATARACT EXTRACTION W/PHACO Left 02/11/2023   Procedure: CATARACT EXTRACTION PHACO AND INTRAOCULAR LENS PLACEMENT (IOC) LEFT  CLAREON VIVITY TORIC LENS 2.60 00:26.2;  Surgeon: Myrna Adine Anes, MD;  Location: [facility];  Service: Ophthalmology;  Laterality: Left;   CATARACT EXTRACTION W/PHACO Right 03/25/2023   Procedure: CATARACT EXTRACTION PHACO AND INTRAOCULAR LENS PLACEMENT (IOC) RIGHT  CLAREON VIVITY TORIC LENS;  Surgeon: Myrna Adine Anes, MD;  Location: [facility];  Service: Ophthalmology;  Laterality: Right;  3.34 0:33.7   COLONOSCOPY     COLONOSCOPY WITH PROPOFOL  N/A 01/01/2022   Procedure: COLONOSCOPY WITH PROPOFOL ;  Surgeon: Maryruth Ole DASEN, MD;  Location: [facility];  Service: Endoscopy;  Laterality: N/A;   MOHS SURGERY  05/11/2019   left nasal side wall bcc Dr. Lloyd in [place]    ruptured disc repair     L4/5 10/2008 with complications residual right leg weakness/reduced sensation and DVT in 2010 post surgery     Social History   Socioeconomic History   Marital status: Married    Spouse name: Not on file   Number of children: 2   Years of education: Not on file   Highest education level: Not on file  Occupational  History   Occupation: Retired  Tobacco Use   Smoking status: Never   Smokeless tobacco: Never  Vaping Use   Vaping status: Never Used  Substance and Sexual Activity   Alcohol use: Yes    Comment: occ   Drug use: Yes    Types: Hydrocodone     Comment: PRN for pain flare ups   Sexual activity: Not on file  Other Topics Concern   Not on file  Social History Narrative   BSN CCM, RN   Moved from [place]  recently    2 daughters    Retired and married    No guns, wears seat belt, safe in relationship    Lives at [employer]    Social Drivers of Health   Financial Resource Strain: Low Risk  (10/17/2022)   Overall Financial Resource Strain (CARDIA)    Difficulty of Paying Living Expenses: Not hard at all  Food Insecurity: No Food Insecurity (10/30/2022)   Hunger Vital Sign    Worried About Running Out of Food in the Last Year: Never true    Ran Out of Food in the Last Year: Never true  Transportation Needs: No Transportation Needs (10/30/2022)   PRAPARE - Administrator, Civil Service (Medical): No    Lack of Transportation (Non-Medical): No  Physical Activity: Sufficiently Active (10/17/2022)   Exercise Vital Sign    Days of Exercise per Week: 5 days    Minutes of Exercise per Session: 30 min  Stress: No Stress Concern Present (10/17/2022)   [employer] of Occupational Health - Occupational Stress Questionnaire    Feeling of Stress : Not at all  Social Connections: Moderately Integrated (10/17/2022)   Social Connection and Isolation Panel    Frequency of Communication with Friends and Family: More than three times a week    Frequency of Social Gatherings with Friends and Family: More than three times a week    Attends Religious Services: Never    Database administrator or Organizations: Yes    Attends Engineer, structural: More than 4 times per year    Marital Status: Married  Catering manager Violence: Not At Risk (10/30/2022)   Humiliation, Afraid, Rape,  and Kick questionnaire    Fear of Current or Ex-Partner: No    Emotionally Abused: No    Physically Abused: No    Sexually Abused: No    Family History  Problem Relation Age of Onset   Hypertension Mother    Stroke Mother        in 37s    Heart disease Father    Stroke Father    Other Daughter        chronic back pain nerve sheath abnormal in spine s/p surgery  Liver cancer Paternal Aunt    Stroke Paternal Grandfather    Stroke Brother    Esophageal cancer Brother    Breast cancer Other 37       niece   Hyperlipidemia Other     Allergies  Allergen Reactions   Duloxetine  Other (See Comments)    Nightmares, severe depression-    Macrobid [Nitrofurantoin Macrocrystal] Shortness Of Breath and Nausea Only   Shellfish Allergy Diarrhea and Nausea And Vomiting    Lobster only.  Causes food poisoning like symptoms   Statins Other (See Comments)    DIZZINESS    Sulfa Antibiotics     Swelling      Outpatient Medications Prior to Visit  Medication Sig   amLODipine  (NORVASC ) 2.5 MG tablet Take 1 tablet (2.5 mg total) by mouth daily. In am if BP>140/?90 take another tablet   Ascorbic Acid (VITAMIN C) 1000 MG tablet Take 1,000 mg by mouth 3 (three) times daily.   b complex vitamins capsule Take 1 capsule by mouth daily. B-12, b-6, folate   Cholecalciferol (VITAMIN D3) 5000 units CAPS Take by mouth.   Coenzyme Q10-Fish Oil-Vit E (CO-Q 10 OMEGA-3 FISH OIL PO) Take by mouth.   diazepam  (VALIUM ) 2 MG tablet TAKE 1/2 TO 1 TABLET BY MOUTH EVERY NIGHT AT BEDTIME AS NEEDED (Patient taking differently: Take 2 mg by mouth as needed. TAKE 1/2 TO 1 TABLET BY MOUTH EVERY NIGHT AT BEDTIME AS NEEDED)   gabapentin  (NEURONTIN ) 300 MG capsule Take 1 capsule (300 mg total) by mouth 3 (three) times daily.   HYDROcodone -acetaminophen  (NORCO/VICODIN) 5-325 MG tablet Take 1 tablet by mouth 2 (two) times daily as needed (only for flare ups).   levothyroxine  (SYNTHROID ) 50 MCG tablet Take 1 tablet (50 mcg  total) by mouth daily before breakfast.   MAGNESIUM MALATE PO Take 375 mg by mouth.   meclizine  (ANTIVERT ) 25 MG tablet Take 1 tablet (25 mg total) by mouth 3 (three) times daily as needed for dizziness.   Multiple Vitamins-Minerals (PRESERVISION AREDS 2 PO) Take by mouth.   Multiple Vitamins-Minerals (PRESERVISION AREDS PO) Take by mouth daily.   Nattokinase 100 MG CAPS Take by mouth daily. 2000 iu (Patient taking differently: Take 2,000 mg by mouth daily. 2000 iu)   ondansetron  (ZOFRAN ) 4 MG tablet Take 1 tablet (4 mg total) by mouth every 8 (eight) hours as needed for nausea or vomiting.   OVER THE COUNTER MEDICATION Take 1,500 mg by mouth 3 (three) times daily. [place]   Strontium Chloride POWD Use as directed 250 mg in the mouth or throat.   TURMERIC PO Take 2,250 mg by mouth daily.   [DISCONTINUED] fluticasone  (FLONASE ) 50 MCG/ACT nasal spray Place 2 sprays into both nostrils daily.   DULoxetine  (CYMBALTA ) 20 MG capsule Take 2 capsules (40 mg total) by mouth daily. X  1week, then 40 mg daily - for nerve pain- after first month, cannot tolerate 60 mg (Patient not taking: Reported on 01/31/2024)   fexofenadine  (ALLEGRA  ALLERGY) 180 MG tablet Take 1 tablet (180 mg total) by mouth daily. (Patient not taking: Reported on 01/31/2024)   hydrocortisone  2.5 % ointment Apply topically 2 (two) times daily. Apply to affected area two times a day as needed for 5 days (Patient not taking: Reported on 01/31/2024)   No facility-administered medications prior to visit.    Review of Systems  Constitutional: Negative.   HENT: Negative.    Eyes: Negative.   Respiratory: Negative.  Negative for cough and shortness of  breath.   Cardiovascular: Negative.  Negative for chest pain, palpitations and leg swelling.  Gastrointestinal: Negative.  Negative for abdominal pain, constipation, diarrhea, heartburn, nausea and vomiting.  Genitourinary: Negative.  Negative for dysuria and flank pain.  Musculoskeletal: Negative.   Negative for joint pain and myalgias.  Skin: Negative.   Neurological: Negative.  Negative for dizziness and headaches.  Endo/Heme/Allergies: Negative.   Psychiatric/Behavioral: Negative.  Negative for depression and suicidal ideas. The patient is not nervous/anxious.        Objective:   BP (!) 152/86   Pulse 74   Ht 5' 5 (1.651 m)   Wt 192 lb 3.2 oz (87.2 kg)   SpO2 96%   BMI 31.98 kg/m   Vitals:   01/31/24 1043  BP: (!) 152/86  Pulse: 74  Height: 5' 5 (1.651 m)  Weight: 192 lb 3.2 oz (87.2 kg)  SpO2: 96%  BMI (Calculated): 31.98    Physical Exam Vitals and nursing note reviewed.  Constitutional:      Appearance: Normal appearance.  HENT:     Head: Normocephalic and atraumatic.     Left Ear: A middle ear effusion is present.     Nose: Nose normal.     Mouth/Throat:     Mouth: Mucous membranes are moist.     Pharynx: Oropharynx is clear.  Eyes:     Conjunctiva/sclera: Conjunctivae normal.     Pupils: Pupils are equal, round, and reactive to light.  Cardiovascular:     Rate and Rhythm: Normal rate and regular rhythm.     Pulses: Normal pulses.     Heart sounds: Normal heart sounds. No murmur heard. Pulmonary:     Effort: Pulmonary effort is normal.     Breath sounds: Normal breath sounds. No wheezing.  Abdominal:     General: Bowel sounds are normal.     Palpations: Abdomen is soft.     Tenderness: There is no abdominal tenderness. There is no right CVA tenderness or left CVA tenderness.  Musculoskeletal:        General: Normal range of motion.     Cervical back: Normal range of motion.     Right lower leg: No edema.     Left lower leg: No edema.  Skin:    General: Skin is warm and dry.  Neurological:     General: No focal deficit present.     Mental Status: She is alert and oriented to person, place, and time.  Psychiatric:        Mood and Affect: Mood normal.        Behavior: Behavior normal.      No results found for any visits on  01/31/24.  Recent Results (from the past 2160 hours)  TSH+T4F+T3Free     Status: None   Collection Time: 01/14/24 10:41 AM  Result Value Ref Range   TSH 1.930 0.450 - 4.500 uIU/mL   T3, Free 2.5 2.0 - 4.4 pg/mL   Free T4 1.40 0.82 - 1.77 ng/dL  [id number]+ZHQM     Status: Abnormal   Collection Time: 01/14/24 10:41 AM  Result Value Ref Range   Glucose 113 (H) 70 - 99 mg/dL   BUN 13 8 - 27 mg/dL   Creatinine, Ser 9.22 0.57 - 1.00 mg/dL   eGFR 81 >40 fO/fpw/8.26   BUN/Creatinine Ratio 17 12 - 28   Sodium 141 134 - 144 mmol/L   Potassium 4.1 3.5 - 5.2 mmol/L   Chloride 104 96 - 106 mmol/L  CO2 22 20 - 29 mmol/L   Calcium  9.8 8.7 - 10.3 mg/dL   Total Protein 7.9 6.0 - 8.5 g/dL   Albumin 4.6 3.8 - 4.8 g/dL   Globulin, Total 3.3 1.5 - 4.5 g/dL   Bilirubin Total 0.4 0.0 - 1.2 mg/dL   Alkaline Phosphatase 99 44 - 121 IU/L   AST 26 0 - 40 IU/L   ALT 45 (H) 0 - 32 IU/L  Lipid Panel w/o Chol/HDL Ratio     Status: Abnormal   Collection Time: 01/14/24 10:41 AM  Result Value Ref Range   Cholesterol, Total 361 (H) 100 - 199 mg/dL   Triglycerides 748 (H) 0 - 149 mg/dL   HDL 42 >60 mg/dL   VLDL Cholesterol Cal 54 (H) 5 - 40 mg/dL   LDL Chol Calc (NIH) 734 (H) 0 - 99 mg/dL   LDL CALC COMMENT: Comment     Comment: Consider evaluating for Familial Hypercholesterolemia(FH), if clinically indicated.   CBC with Diff     Status: None   Collection Time: 01/14/24 10:41 AM  Result Value Ref Range   WBC 6.9 3.4 - 10.8 x10E3/uL   RBC 4.47 3.77 - 5.28 x10E6/uL   Hemoglobin 13.7 11.1 - 15.9 g/dL   Hematocrit 57.4 65.9 - 46.6 %   MCV 95 79 - 97 fL   MCH 30.6 26.6 - 33.0 pg   MCHC 32.2 31.5 - 35.7 g/dL   RDW 87.1 88.2 - 84.5 %   Platelets 291 150 - 450 x10E3/uL   Neutrophils 55 Not Estab. %   Lymphs 37 Not Estab. %   Monocytes 6 Not Estab. %   Eos 1 Not Estab. %   Basos 1 Not Estab. %   Neutrophils Absolute 3.8 1.4 - 7.0 x10E3/uL   Lymphocytes Absolute 2.6 0.7 - 3.1 x10E3/uL   Monocytes  Absolute 0.4 0.1 - 0.9 x10E3/uL   EOS (ABSOLUTE) 0.1 0.0 - 0.4 x10E3/uL   Basophils Absolute 0.1 0.0 - 0.2 x10E3/uL   Immature Granulocytes 0 Not Estab. %   Immature Grans (Abs) 0.0 0.0 - 0.1 x10E3/uL  Vitamin D  (25 hydroxy)     Status: Abnormal   Collection Time: 01/14/24 10:41 AM  Result Value Ref Range   Vit D, 25-Hydroxy 27.1 (L) 30.0 - 100.0 ng/mL    Comment: Vitamin D  deficiency has been defined by the Institute of Medicine and an Endocrine Society practice guideline as a level of serum 25-OH vitamin D  less than 20 ng/mL (1,2). The Endocrine Society went on to further define vitamin D  insufficiency as a level between 21 and 29 ng/mL (2). 1. IOM (Institute of Medicine). 2010. Dietary reference    intakes for calcium  and D. [place]: The    [employer]. 2. Holick MF, Binkley [place], Bischoff-Ferrari HA, et al.    Evaluation, treatment, and prevention of vitamin D     deficiency: an Endocrine Society clinical practice    guideline. JCEM. 2011 Jul; 96(7):1911-30.   ToxAssure Select Plus     Status: None   Collection Time: 01/17/24  2:33 PM  Result Value Ref Range   Summary FINAL     Comment: ==================================================================== ToxAssure Select,+Antidepr,UR ==================================================================== Test                             Result       Flag       Units    NO DRUGS DETECTED. ==================================================================== Test  Result    Flag   Units      Ref Range   Creatinine              31               mg/dL      >=79 ==================================================================== Declared Medications:  Medication list was not provided. ==================================================================== For clinical consultation, please call [phone]. ====================================================================       Assessment  & Plan:  Continue medications as prescribed. Refilled Flonase  at patient request. Given samples of Nexlizet  and will send in a prescription; will complete a PA if needed. Ordered CT cardiac calcium  score. Encouraged diet and exercise as tolerated. Problem List Items Addressed This Visit     Essential hypertension, benign   Relevant Medications   Bempedoic Acid-Ezetimibe (NEXLIZET ) 180-10 MG TABS   Hyperlipidemia - Primary   Relevant Medications   Bempedoic Acid-Ezetimibe (NEXLIZET ) 180-10 MG TABS   Other Relevant Orders   CT CARDIAC SCORING (SELF PAY ONLY)   Vitamin D  deficiency   Post-nasal drip   Relevant Medications   fluticasone  (FLONASE ) 50 MCG/ACT nasal spray   Statin intolerance   Relevant Medications   Bempedoic Acid-Ezetimibe (NEXLIZET ) 180-10 MG TABS   Family history of early CAD   Relevant Orders   CT CARDIAC SCORING (SELF PAY ONLY)    Return in about 3 months (around 05/01/2024).   Total time spent: 30 minutes  FERNAND FREDY RAMAN, MD  01/31/2024   This document may have been prepared by [facility] Voice Recognition software and as such may include unintentional dictation errors.

## 2024-02-06 ENCOUNTER — Ambulatory Visit
Admission: RE | Admit: 2024-02-06 | Discharge: 2024-02-06 | Disposition: A | Discharge status: Home / Self Care | Payer: Self-pay | Source: Ambulatory Visit | Attending: Internal Medicine | Admitting: Internal Medicine

## 2024-02-06 DIAGNOSIS — Z8249 Family history of ischemic heart disease and other diseases of the circulatory system: Secondary | ICD-10-CM | POA: Insufficient documentation

## 2024-02-06 DIAGNOSIS — E782 Mixed hyperlipidemia: Secondary | ICD-10-CM | POA: Insufficient documentation

## 2024-02-07 ENCOUNTER — Ambulatory Visit: Payer: Self-pay | Admitting: Internal Medicine

## 2024-02-11 ENCOUNTER — Other Ambulatory Visit: Payer: Self-pay

## 2024-02-11 MED ORDER — LEVOTHYROXINE SODIUM 50 MCG PO TABS: 50.0000 ug | ORAL_TABLET | Freq: Every day | ORAL | 3 refills | Status: AC

## 2024-02-11 NOTE — Telephone Encounter (Signed)
 Pt called requesting refill on rx levothyroxine , please advise

## 2024-02-11 NOTE — Telephone Encounter (Signed)
 Erroneous encounter

## 2024-02-14 NOTE — Progress Notes (Signed)
 Patient notified

## 2024-03-16 ENCOUNTER — Other Ambulatory Visit: Payer: Self-pay

## 2024-03-16 DIAGNOSIS — E782 Mixed hyperlipidemia: Secondary | ICD-10-CM

## 2024-03-16 DIAGNOSIS — Z789 Other specified health status: Secondary | ICD-10-CM

## 2024-03-16 MED ORDER — NEXLIZET 180-10 MG PO TABS: 1.0000 | ORAL_TABLET | Freq: Every day | ORAL | 1 refills | Status: DC

## 2024-04-17 ENCOUNTER — Encounter: Payer: Self-pay | Admitting: Physical Medicine and Rehabilitation

## 2024-04-17 ENCOUNTER — Encounter: Attending: Physical Medicine and Rehabilitation | Admitting: Physical Medicine and Rehabilitation

## 2024-04-17 VITALS — BP 161/83 | HR 72 | Ht 65.0 in | Wt 188.0 lb

## 2024-04-17 DIAGNOSIS — G8929 Other chronic pain: Secondary | ICD-10-CM | POA: Insufficient documentation

## 2024-04-17 DIAGNOSIS — M5416 Radiculopathy, lumbar region: Secondary | ICD-10-CM | POA: Diagnosis not present

## 2024-04-17 DIAGNOSIS — G629 Polyneuropathy, unspecified: Secondary | ICD-10-CM | POA: Insufficient documentation

## 2024-04-17 MED ORDER — HYDROCODONE-ACETAMINOPHEN 5-325 MG PO TABS: 1.0000 | ORAL_TABLET | Freq: Two times a day (BID) | ORAL | 0 refills | Status: AC | PRN

## 2024-04-17 NOTE — Progress Notes (Signed)
 Subjective:    Patient ID: Tonya Green, female    DOB: 1951/05/06, 73 y.o.   MRN: [id number]  HPI  Patient is a 73 yr old female with hx of CAD, HTN, hx of DVT 2010 after back surgery- R-L4-5 Microdisectomy for Herniated disc - ; HLD, Factor V Leidin Mutation, osteopenia- has collagenous colitis.  Hypothyroidism- last TSH 2.02 Here for f/u on flares of back pain .     Typical pain.  Periodic flareups- last was last week.   Was hoping to try Ketamine.   Has pain 2-3/10 all the time, but   Got into see the GI doc-   thought collagenous colitis was the reason why diarrhea so awful.   Haven't tried Keppra yet  At independent living facility-  In the event in healthcare, concerned won't get treatment.   Concerned about calcium  score  Can walk 8-10k steps- gotten back to normal level of steps.   Trying to be compliant    Pain Inventory Average Pain 3 Pain Right Now 3 My pain is burning, tingling, and aching  In the last 24 hours, has pain interfered with the following? General activity 0 Relation with others 0 Enjoyment of life 0 What TIME of day is your pain at its worst? varies Sleep (in general) Fair  Pain is worse with: . Pain improves with: rest, therapy/exercise, pacing activities, medication, and FSM Relief from Meds: 8  Family History  Problem Relation Age of Onset   Hypertension Mother    Stroke Mother        in 30s    Heart disease Father    Stroke Father    Other Daughter        chronic back pain nerve sheath abnormal in spine s/p surgery   Liver cancer Paternal Aunt    Stroke Paternal Grandfather    Stroke Brother    Esophageal cancer Brother    Breast cancer Other 37       niece   Hyperlipidemia Other    Social History   Socioeconomic History   Marital status: Married    Spouse name: Not on file   Number of children: 2   Years of education: Not on file   Highest education level: Not on file  Occupational History   Occupation: Retired   Tobacco Use   Smoking status: Never   Smokeless tobacco: Never  Vaping Use   Vaping status: Never Used  Substance and Sexual Activity   Alcohol use: Yes    Comment: occ   Drug use: Yes    Types: Hydrocodone     Comment: PRN for pain flare ups   Sexual activity: Not on file  Other Topics Concern   Not on file  Social History Narrative   BSN CCM, RN   Moved from [place]  recently    2 daughters    Retired and married    No guns, wears seat belt, safe in relationship    Lives at [facility]    Social Drivers of Health   Financial Resource Strain: Low Risk  (10/17/2022)   Overall Financial Resource Strain (CARDIA)    Difficulty of Paying Living Expenses: Not hard at all  Food Insecurity: No Food Insecurity (10/30/2022)   Hunger Vital Sign    Worried About Running Out of Food in the Last Year: Never true    Ran Out of Food in the Last Year: Never true  Transportation Needs: No Transportation Needs (10/30/2022)   PRAPARE - Transportation  Lack of Transportation (Medical): No    Lack of Transportation (Non-Medical): No  Physical Activity: Sufficiently Active (10/17/2022)   Exercise Vital Sign    Days of Exercise per Week: 5 days    Minutes of Exercise per Session: 30 min  Stress: No Stress Concern Present (10/17/2022)   [employer] of Occupational Health - Occupational Stress Questionnaire    Feeling of Stress : Not at all  Social Connections: Moderately Integrated (10/17/2022)   Social Connection and Isolation Panel    Frequency of Communication with Friends and Family: More than three times a week    Frequency of Social Gatherings with Friends and Family: More than three times a week    Attends Religious Services: Never    Database Administrator or Organizations: Yes    Attends Engineer, Structural: More than 4 times per year    Marital Status: Married   Past Surgical History:  Procedure Laterality Date   APPENDECTOMY     2000   BACK SURGERY     BREAST  EXCISIONAL BIOPSY Left 2005   for infection   BREAST SURGERY     biospy ? year    CATARACT EXTRACTION W/PHACO Left 02/11/2023   Procedure: CATARACT EXTRACTION PHACO AND INTRAOCULAR LENS PLACEMENT (IOC) LEFT  CLAREON VIVITY TORIC LENS 2.60 00:26.2;  Surgeon: Myrna Adine Anes, MD;  Location: [facility];  Service: Ophthalmology;  Laterality: Left;   CATARACT EXTRACTION W/PHACO Right 03/25/2023   Procedure: CATARACT EXTRACTION PHACO AND INTRAOCULAR LENS PLACEMENT (IOC) RIGHT  CLAREON VIVITY TORIC LENS;  Surgeon: Myrna Adine Anes, MD;  Location: [facility];  Service: Ophthalmology;  Laterality: Right;  3.34 0:33.7   COLONOSCOPY     COLONOSCOPY WITH PROPOFOL  N/A 01/01/2022   Procedure: COLONOSCOPY WITH PROPOFOL ;  Surgeon: Maryruth Ole DASEN, MD;  Location: [facility];  Service: Endoscopy;  Laterality: N/A;   MOHS SURGERY  05/11/2019   left nasal side wall bcc Dr. Lloyd in [place]    ruptured disc repair     L4/5 10/2008 with complications residual right leg weakness/reduced sensation and DVT in 2010 post surgery    Past Surgical History:  Procedure Laterality Date   APPENDECTOMY     2000   BACK SURGERY     BREAST EXCISIONAL BIOPSY Left 2005   for infection   BREAST SURGERY     biospy ? year    CATARACT EXTRACTION W/PHACO Left 02/11/2023   Procedure: CATARACT EXTRACTION PHACO AND INTRAOCULAR LENS PLACEMENT (IOC) LEFT  CLAREON VIVITY TORIC LENS 2.60 00:26.2;  Surgeon: Myrna Adine Anes, MD;  Location: [facility];  Service: Ophthalmology;  Laterality: Left;   CATARACT EXTRACTION W/PHACO Right 03/25/2023   Procedure: CATARACT EXTRACTION PHACO AND INTRAOCULAR LENS PLACEMENT (IOC) RIGHT  CLAREON VIVITY TORIC LENS;  Surgeon: Myrna Adine Anes, MD;  Location: [facility];  Service: Ophthalmology;  Laterality: Right;  3.34 0:33.7   COLONOSCOPY     COLONOSCOPY WITH PROPOFOL  N/A 01/01/2022   Procedure: COLONOSCOPY WITH PROPOFOL ;  Surgeon: Maryruth Ole DASEN, MD;   Location: [facility];  Service: Endoscopy;  Laterality: N/A;   MOHS SURGERY  05/11/2019   left nasal side wall bcc Dr. Lloyd in [place]    ruptured disc repair     L4/5 10/2008 with complications residual right leg weakness/reduced sensation and DVT in 2010 post surgery    Past Medical History:  Diagnosis Date   Basal cell carcinoma    left nasal sidewall 05/11/19 [facility]   Carotid artery stenosis    mild L>R 03/26/16 [place]     Cataract of left eye 05/25/2021   Chronic cough 04/03/2022   Complication of anesthesia    slow to wake   Diverticulosis    colonoscopy 10/13/16 [place]    Factor 5 Leiden mutation, heterozygous    Fatty liver    Heterozygous factor V Leiden mutation 11/01/2017   History of blood transfusion    birth Rh factor    History of skin cancer 11/01/2017   H/o BCC forehead and SCC nose    Hyperlipidemia    Hypertension    Hyponatremia 12/18/2017   Leukocytosis 12/18/2017   OSA on CPAP    Radiculopathy    Right leg DVT (HCC)    2010 s/p back surgery was on Coumadin x 6 months    Thyroid  disease    hypothyroidism    UTI (urinary tract infection) 08/21/2018   BP (!) 161/83   Pulse 72   Ht 5' 5 (1.651 m)   Wt 188 lb (85.3 kg)   SpO2 97%   BMI 31.28 kg/m   Opioid Risk Score:   Fall Risk Score:  `1  Depression screen [facility] 2/9     01/14/2024    3:21 PM 07/12/2023    8:47 AM 05/15/2023    8:05 AM 11/12/2022    8:55 AM 10/30/2022   11:20 AM 10/17/2022    2:02 PM 10/01/2022    9:20 AM  Depression screen PHQ 2/9  Decreased Interest 0 1 1 0 0 0 0  Down, Depressed, Hopeless 0 1 1 0 0 0 0  PHQ - 2 Score 0 2 2 0 0 0 0  Altered sleeping 1 3 1  0   1  Tired, decreased energy 0 0 0 0   0  Change in appetite 0 1 1 0   1  Feeling bad or failure about yourself  0 0 0 0   0  Trouble concentrating 0 0 0 0   0  Moving slowly or fidgety/restless 0 0 0 0   0  Suicidal thoughts 0 1 0 0   0  PHQ-9 Score 1  7  4   0    2   Difficult doing  work/chores Not difficult at all Not difficult at all Not difficult at all Not difficult at all   Not difficult at all     Data saved with a previous flowsheet row definition     Review of Systems An entire ROS was completed and negative except HPI     Objective:   Physical Exam  Awake appropriate, alert- accompanied by husband, NAD  Sitting on exam table No flare today of pain       Assessment & Plan:    Patient is a 73 yr old female with hx of CAD, HTN, hx of DVT 2010 after back surgery- R-L4-5 Microdisectomy for Herniated disc - ; HLD, Factor V Leidin Mutation, osteopenia-  Hypothyroidism- last TSH 2.02 Here for f/u on flares of back pain .     We discussed how to help her with pain-   2.  We also discussed Ketamine-  and who to refer to.  We discussed at length about pain that cannot be stopped- Sent message to Dr Isaiah Public- and will check with Dr Lajean   3.   Will refill - Norco-  # 60 just in case. Last refill 01/17/24-   4. Last UDS was great-  no issues- is appropriate- so will con't meds. Doesn't need again today.    5.  Last Vitd D level 27- getting blood work next week and high Calcium  scan- went to 60-  from 62-    6. Has DNR-  full DNR-  on board.  We discussed at length about working on making sure in effect.  Goal is to NOT hurt!  7.   F/U  3 months- f/u on chronic pain.     I spent a total of 31    minutes on total care today- >50% coordination of care- due to d/w pt about DNR- and and d/w about Keppra- will wait on this.

## 2024-04-27 ENCOUNTER — Other Ambulatory Visit

## 2024-04-27 DIAGNOSIS — I10 Essential (primary) hypertension: Secondary | ICD-10-CM

## 2024-04-27 DIAGNOSIS — E782 Mixed hyperlipidemia: Secondary | ICD-10-CM

## 2024-04-27 DIAGNOSIS — R7303 Prediabetes: Secondary | ICD-10-CM

## 2024-04-27 DIAGNOSIS — E039 Hypothyroidism, unspecified: Secondary | ICD-10-CM

## 2024-04-28 ENCOUNTER — Ambulatory Visit: Payer: Self-pay | Admitting: Internal Medicine

## 2024-04-28 LAB — LIPID PANEL W/O CHOL/HDL RATIO
Cholesterol, Total: 209 mg/dL — ABNORMAL HIGH (ref 100–199)
HDL: 45 mg/dL (ref >39)
LDL Chol Calc (NIH): 135 mg/dL — ABNORMAL HIGH (ref 0–99)
Triglycerides: 164 mg/dL — ABNORMAL HIGH (ref 0–149)
VLDL Cholesterol Cal: 29 mg/dL (ref 5–40)

## 2024-04-28 LAB — CBC WITH DIFFERENTIAL/PLATELET
Basophils Absolute: 0.1 x10E3/uL (ref 0.0–0.2)
Basos: 1 %
EOS (ABSOLUTE): 0.2 x10E3/uL (ref 0.0–0.4)
Eos: 3 %
Hematocrit: 38.7 % (ref 34.0–46.6)
Hemoglobin: 12.6 g/dL (ref 11.1–15.9)
Immature Grans (Abs): 0 x10E3/uL (ref 0.0–0.1)
Immature Granulocytes: 0 %
Lymphocytes Absolute: 3.3 x10E3/uL — ABNORMAL HIGH (ref 0.7–3.1)
Lymphs: 51 %
MCH: 30.2 pg (ref 26.6–33.0)
MCHC: 32.6 g/dL (ref 31.5–35.7)
MCV: 93 fL (ref 79–97)
Monocytes Absolute: 0.6 x10E3/uL (ref 0.1–0.9)
Monocytes: 9 %
Neutrophils Absolute: 2.3 x10E3/uL (ref 1.4–7.0)
Neutrophils: 36 %
Platelets: 304 x10E3/uL (ref 150–450)
RBC: 4.17 x10E6/uL (ref 3.77–5.28)
RDW: 13 % (ref 11.7–15.4)
WBC: 6.5 x10E3/uL (ref 3.4–10.8)

## 2024-04-28 LAB — CMP14+EGFR
ALT: 37 IU/L — ABNORMAL HIGH (ref 0–32)
AST: 37 IU/L (ref 0–40)
Albumin: 4.4 g/dL (ref 3.8–4.8)
Alkaline Phosphatase: 76 IU/L (ref 49–135)
BUN/Creatinine Ratio: 26 (ref 12–28)
BUN: 20 mg/dL (ref 8–27)
Bilirubin Total: 0.5 mg/dL (ref 0.0–1.2)
CO2: 19 mmol/L — ABNORMAL LOW (ref 20–29)
Calcium: 9.2 mg/dL (ref 8.7–10.3)
Chloride: 101 mmol/L (ref 96–106)
Creatinine, Ser: 0.77 mg/dL (ref 0.57–1.00)
Globulin, Total: 2.8 g/dL (ref 1.5–4.5)
Glucose: 94 mg/dL (ref 70–99)
Potassium: 4.5 mmol/L (ref 3.5–5.2)
Sodium: 135 mmol/L (ref 134–144)
Total Protein: 7.2 g/dL (ref 6.0–8.5)
eGFR: 81 mL/min/1.73 (ref >59)

## 2024-04-28 LAB — HEMOGLOBIN A1C
Est. average glucose Bld gHb Est-mCnc: 126 mg/dL
Hgb A1c MFr Bld: 6 % — ABNORMAL HIGH (ref 4.8–5.6)

## 2024-04-28 LAB — TSH+T4F+T3FREE
Free T4: 1.43 ng/dL (ref 0.82–1.77)
T3, Free: 2.4 pg/mL (ref 2.0–4.4)
TSH: 1.72 u[IU]/mL (ref 0.450–4.500)

## 2024-04-29 NOTE — Progress Notes (Signed)
 Patient notified

## 2024-05-01 ENCOUNTER — Ambulatory Visit (INDEPENDENT_AMBULATORY_CARE_PROVIDER_SITE_OTHER): Admitting: Internal Medicine

## 2024-05-01 ENCOUNTER — Encounter: Payer: Self-pay | Admitting: Internal Medicine

## 2024-05-01 VITALS — BP 150/90 | HR 96 | Ht 65.0 in | Wt 190.4 lb

## 2024-05-01 DIAGNOSIS — Z6834 Body mass index (BMI) 34.0-34.9, adult: Secondary | ICD-10-CM

## 2024-05-01 DIAGNOSIS — I10 Essential (primary) hypertension: Secondary | ICD-10-CM | POA: Diagnosis not present

## 2024-05-01 DIAGNOSIS — D682 Hereditary deficiency of other clotting factors: Secondary | ICD-10-CM | POA: Insufficient documentation

## 2024-05-01 DIAGNOSIS — E66811 Obesity, class 1: Secondary | ICD-10-CM

## 2024-05-01 DIAGNOSIS — E559 Vitamin D deficiency, unspecified: Secondary | ICD-10-CM

## 2024-05-01 DIAGNOSIS — M5416 Radiculopathy, lumbar region: Secondary | ICD-10-CM

## 2024-05-01 DIAGNOSIS — E6609 Other obesity due to excess calories: Secondary | ICD-10-CM | POA: Insufficient documentation

## 2024-05-01 DIAGNOSIS — G629 Polyneuropathy, unspecified: Secondary | ICD-10-CM | POA: Diagnosis not present

## 2024-05-01 DIAGNOSIS — E039 Hypothyroidism, unspecified: Secondary | ICD-10-CM

## 2024-05-01 DIAGNOSIS — G4733 Obstructive sleep apnea (adult) (pediatric): Secondary | ICD-10-CM

## 2024-05-01 DIAGNOSIS — R7303 Prediabetes: Secondary | ICD-10-CM

## 2024-05-01 DIAGNOSIS — E782 Mixed hyperlipidemia: Secondary | ICD-10-CM

## 2024-05-01 DIAGNOSIS — Z789 Other specified health status: Secondary | ICD-10-CM

## 2024-05-01 MED ORDER — GABAPENTIN 300 MG PO CAPS: 300.0000 mg | ORAL_CAPSULE | Freq: Three times a day (TID) | ORAL | 3 refills | Status: AC

## 2024-05-01 NOTE — Progress Notes (Signed)
 Established Patient Office Visit  Subjective:  Patient ID: Tonya Green, female    DOB: [birth year]  Age: 73 y.o. MRN: [id number]  Chief Complaint  Patient presents with   Follow-up    3 month follow up    Patient is here today for follow up. She is concerns about end of life care and proper pain management. She brought DNR forms that she would like filled out today. Will add copy of active DNR into patients record. She is also interested in [place] approved DNR tags she can wear incase something were to happen. Patient recently had advance directives completed and those are previously scanned into her media.  She reports major concerns with pain control. She has pain medication specialist. She states Dr.Lovorn currently manages her controlled substances but is retiring soon. She takes extra strength Tylenol  to supplement her pain so she does not have to take the Norco or Valium  regularly. She reports needing to take Tylenol  2-3 times a week in addition to her gabapentin  300 mg 3 times a day. She reports needing to take her 1 dose of her Norco and Valium  approximately 2 weeks ago for a flare up of neuropathy. She reports on average taking Norco or valium  for pain relief less than 3 times a month. She reports when she has a flare up she is debilitated and unable to walk. She describes it as sharp pain going down her right leg and feels as if her right foot is getting shocked by pulsating electricity. She has tried and failed Duloxetine , Tens units, acupuncture, physical therapy in the past. Patient states she wants a primary care provider that can manage her as a whole without having to see multiple specialists as she ages.   Patient had cardiac calcium  score 01/2024 that indicated stable lung nodule but recommended CT chest FU in 6-12 months. Patient will see her Pulmonologist soon and wants to discuss results with them prior to scheduling additional imaging.   Patients blood pressure is elevated today.  Patients at home blood pressure log 110/60-130/80's with her current medications. She denies any chest pain, shortness of breath, palpitations at this time. Recommend patient bring BP monitor to next appointment to calibrate.       No other concerns at this time.   Past Medical History:  Diagnosis Date   Basal cell carcinoma    left nasal sidewall [facility]/11/[facility]   Carotid artery stenosis    mild L>R 03/26/16 [place]     Cataract of left eye 05/25/2021   Chronic cough 04/03/2022   Complication of anesthesia    slow to wake   Diverticulosis    colonoscopy 10/13/16 [place]    Factor 5 Leiden mutation, heterozygous    Fatty liver    Heterozygous factor V Leiden mutation 11/01/2017   History of blood transfusion    birth Rh factor    History of skin cancer 11/01/2017   H/o BCC forehead and SCC nose    Hyperlipidemia    Hypertension    Hyponatremia 12/18/2017   Leukocytosis 12/18/2017   OSA on CPAP    Radiculopathy    Right leg DVT (HCC)    2010 s/p back surgery was on Coumadin x 6 months    Thyroid  disease    hypothyroidism    UTI (urinary tract infection) 08/21/2018    Past Surgical History:  Procedure Laterality Date   APPENDECTOMY     2000   BACK SURGERY  BREAST EXCISIONAL BIOPSY Left 2005   for infection   BREAST SURGERY     biospy ? year    CATARACT EXTRACTION W/PHACO Left 02/11/2023   Procedure: CATARACT EXTRACTION PHACO AND INTRAOCULAR LENS PLACEMENT (IOC) LEFT  CLAREON VIVITY TORIC LENS 2.60 00:26.2;  Surgeon: Myrna Adine Anes, MD;  Location: [facility];  Service: Ophthalmology;  Laterality: Left;   CATARACT EXTRACTION W/PHACO Right 03/25/2023   Procedure: CATARACT EXTRACTION PHACO AND INTRAOCULAR LENS PLACEMENT (IOC) RIGHT  CLAREON VIVITY TORIC LENS;  Surgeon: Myrna Adine Anes, MD;  Location: [facility];  Service: Ophthalmology;  Laterality: Right;  3.34 0:33.7   COLONOSCOPY     COLONOSCOPY WITH PROPOFOL   N/A 01/01/2022   Procedure: COLONOSCOPY WITH PROPOFOL ;  Surgeon: Maryruth Ole DASEN, MD;  Location: [facility];  Service: Endoscopy;  Laterality: N/A;   MOHS SURGERY  05/11/2019   left nasal side wall bcc Dr. Lloyd in [place]    ruptured disc repair     L4/5 10/2008 with complications residual right leg weakness/reduced sensation and DVT in 2010 post surgery     Social History   Socioeconomic History   Marital status: Married    Spouse name: Not on file   Number of children: 2   Years of education: Not on file   Highest education level: Not on file  Occupational History   Occupation: Retired  Tobacco Use   Smoking status: Never   Smokeless tobacco: Never  Vaping Use   Vaping status: Never Used  Substance and Sexual Activity   Alcohol use: Yes    Comment: occ   Drug use: Yes    Types: Hydrocodone     Comment: PRN for pain flare ups   Sexual activity: Not on file  Other Topics Concern   Not on file  Social History Narrative   BSN CCM, RN   Moved from [place]  recently    2 daughters    Retired and married    No guns, wears seat belt, safe in relationship    Lives at [employer]    Social Drivers of Health   Financial Resource Strain: Low Risk  (10/17/2022)   Overall Financial Resource Strain (CARDIA)    Difficulty of Paying Living Expenses: Not hard at all  Food Insecurity: No Food Insecurity (10/30/2022)   Hunger Vital Sign    Worried About Running Out of Food in the Last Year: Never true    Ran Out of Food in the Last Year: Never true  Transportation Needs: No Transportation Needs (10/30/2022)   PRAPARE - Administrator, Civil Service (Medical): No    Lack of Transportation (Non-Medical): No  Physical Activity: Sufficiently Active (10/17/2022)   Exercise Vital Sign    Days of Exercise per Week: 5 days    Minutes of Exercise per Session: 30 min  Stress: No Stress Concern Present (10/17/2022)   [employer] of Occupational Health - Occupational Stress  Questionnaire    Feeling of Stress : Not at all  Social Connections: Moderately Integrated (10/17/2022)   Social Connection and Isolation Panel    Frequency of Communication with Friends and Family: More than three times a week    Frequency of Social Gatherings with Friends and Family: More than three times a week    Attends Religious Services: Never    Database Administrator or Organizations: Yes    Attends Engineer, Structural: More than 4 times per year    Marital Status: Married  Intimate Partner Violence: Not At Risk (10/30/2022)   Humiliation, Afraid, Rape, and Kick questionnaire    Fear of Current or Ex-Partner: No    Emotionally Abused: No    Physically Abused: No    Sexually Abused: No    Family History  Problem Relation Age of Onset   Hypertension Mother    Stroke Mother        in 55s    Heart disease Father    Stroke Father    Other Daughter        chronic back pain nerve sheath abnormal in spine s/p surgery   Liver cancer Paternal Aunt    Stroke Paternal Grandfather    Stroke Brother    Esophageal cancer Brother    Breast cancer Other 37       niece   Hyperlipidemia Other     Allergies  Allergen Reactions   Duloxetine  Other (See Comments)    Nightmares, severe depression-    Macrobid [Nitrofurantoin Macrocrystal] Shortness Of Breath and Nausea Only   Shellfish Allergy Diarrhea and Nausea And Vomiting    Lobster only.  Causes food poisoning like symptoms   Statins Other (See Comments)    DIZZINESS    Sulfa Antibiotics     Swelling      Outpatient Medications Prior to Visit  Medication Sig   amLODipine  (NORVASC ) 2.5 MG tablet Take 1 tablet (2.5 mg total) by mouth daily. In am if BP>140/?90 take another tablet (Patient taking differently: Take 5 mg by mouth daily. In am if BP>140/?90 take another tablet)   Ascorbic Acid (VITAMIN C) 1000 MG tablet Take 1,000 mg by mouth 3 (three) times daily.   b complex vitamins capsule Take 1 capsule by mouth  daily. B-12, b-6, folate   Bempedoic Acid-Ezetimibe (NEXLIZET ) 180-10 MG TABS Take 1 tablet by mouth daily.   Cholecalciferol (VITAMIN D3) 5000 units CAPS Take by mouth.   Coenzyme Q10-Fish Oil-Vit E (CO-Q 10 OMEGA-3 FISH OIL PO) Take by mouth.   diazepam  (VALIUM ) 2 MG tablet TAKE 1/2 TO 1 TABLET BY MOUTH EVERY NIGHT AT BEDTIME AS NEEDED (Patient taking differently: Take 2 mg by mouth as needed. TAKE 1/2 TO 1 TABLET BY MOUTH EVERY NIGHT AT BEDTIME AS NEEDED)   HYDROcodone -acetaminophen  (NORCO/VICODIN) 5-325 MG tablet Take 1 tablet by mouth 2 (two) times daily as needed (only for flare ups). (Patient taking differently: Take 1 tablet by mouth every 3 (three) hours as needed (only for flare ups).)   levothyroxine  (SYNTHROID ) 50 MCG tablet Take 1 tablet (50 mcg total) by mouth daily before breakfast.   MAGNESIUM MALATE PO Take 375 mg by mouth.   meclizine  (ANTIVERT ) 25 MG tablet Take 1 tablet (25 mg total) by mouth 3 (three) times daily as needed for dizziness.   Multiple Vitamins-Minerals (PRESERVISION AREDS 2 PO) Take by mouth.   Nattokinase 100 MG CAPS Take by mouth daily. 2000 iu (Patient taking differently: Take 2,000 mg by mouth daily. 2000 iu)   ondansetron  (ZOFRAN ) 4 MG tablet Take 1 tablet (4 mg total) by mouth every 8 (eight) hours as needed for nausea or vomiting.   OVER THE COUNTER MEDICATION Take 1,500 mg by mouth 3 (three) times daily. [place]   Strontium Chloride POWD Use as directed 250 mg in the mouth or throat.   TURMERIC PO Take 2,250 mg by mouth daily.   [DISCONTINUED] gabapentin  (NEURONTIN ) 300 MG capsule Take 1 capsule (300 mg total) by mouth 3 (three) times daily.   [DISCONTINUED]  DULoxetine  (CYMBALTA ) 20 MG capsule Take 2 capsules (40 mg total) by mouth daily. X  1week, then 40 mg daily - for nerve pain- after first month, cannot tolerate 60 mg (Patient not taking: Reported on 01/31/2024)   [DISCONTINUED] fexofenadine  (ALLEGRA  ALLERGY) 180 MG tablet Take 1 tablet (180 mg total) by  mouth daily. (Patient not taking: Reported on 05/01/2024)   [DISCONTINUED] fluticasone  (FLONASE ) 50 MCG/ACT nasal spray Place 2 sprays into both nostrils daily. (Patient not taking: Reported on 05/01/2024)   [DISCONTINUED] hydrocortisone  2.5 % ointment Apply topically 2 (two) times daily. Apply to affected area two times a day as needed for 5 days (Patient not taking: Reported on 05/01/2024)   [DISCONTINUED] Multiple Vitamins-Minerals (PRESERVISION AREDS PO) Take by mouth daily. (Patient not taking: Reported on 05/01/2024)   No facility-administered medications prior to visit.    Review of Systems  Constitutional: Negative.  Negative for chills, fever and malaise/fatigue.  HENT: Negative.  Negative for congestion and sore throat.   Eyes: Negative.  Negative for blurred vision and pain.  Respiratory: Negative.  Negative for cough and shortness of breath.   Cardiovascular: Negative.  Negative for chest pain, palpitations and leg swelling.  Gastrointestinal: Negative.  Negative for abdominal pain, blood in stool, constipation, diarrhea, heartburn, melena, nausea and vomiting.  Genitourinary: Negative.  Negative for dysuria, flank pain, frequency and urgency.  Musculoskeletal: Negative.  Negative for joint pain and myalgias.  Skin: Negative.   Neurological: Negative.  Negative for dizziness, tingling, sensory change, weakness and headaches.  Endo/Heme/Allergies: Negative.   Psychiatric/Behavioral: Negative.  Negative for depression and suicidal ideas. The patient is not nervous/anxious.        Objective:   BP (!) 150/90   Pulse 96   Ht 5' 5 (1.651 m)   Wt 190 lb 6.4 oz (86.4 kg)   SpO2 96%   BMI 31.68 kg/m   Vitals:   05/01/24 0901  BP: (!) 150/90  Pulse: 96  Height: 5' 5 (1.651 m)  Weight: 190 lb 6.4 oz (86.4 kg)  SpO2: 96%  BMI (Calculated): 31.68    Physical Exam Vitals and nursing note reviewed.  Constitutional:      Appearance: Normal appearance.  HENT:     Head:  Normocephalic and atraumatic.     Nose: Nose normal.     Mouth/Throat:     Mouth: Mucous membranes are moist.     Pharynx: Oropharynx is clear.  Eyes:     Conjunctiva/sclera: Conjunctivae normal.     Pupils: Pupils are equal, round, and reactive to light.  Cardiovascular:     Rate and Rhythm: Normal rate and regular rhythm.     Pulses: Normal pulses.     Heart sounds: Normal heart sounds. No murmur heard. Pulmonary:     Effort: Pulmonary effort is normal.     Breath sounds: Normal breath sounds. No wheezing.  Abdominal:     General: Bowel sounds are normal.     Palpations: Abdomen is soft.     Tenderness: There is no abdominal tenderness. There is no right CVA tenderness or left CVA tenderness.  Musculoskeletal:        General: Normal range of motion.     Cervical back: Normal range of motion.     Right lower leg: No edema.     Left lower leg: No edema.  Skin:    General: Skin is warm and dry.  Neurological:     General: No focal deficit present.  Mental Status: She is alert and oriented to person, place, and time.  Psychiatric:        Mood and Affect: Mood normal.        Behavior: Behavior normal.      No results found for any visits on 05/01/24.  Recent Results (from the past 2160 hours)  TSH+T4F+T3Free     Status: None   Collection Time: 04/27/24  8:49 AM  Result Value Ref Range   TSH 1.720 0.450 - 4.500 uIU/mL   T3, Free 2.4 2.0 - 4.4 pg/mL   Free T4 1.43 0.82 - 1.77 ng/dL  Hemoglobin J8r     Status: Abnormal   Collection Time: 04/27/24  8:49 AM  Result Value Ref Range   Hgb A1c MFr Bld 6.0 (H) 4.8 - 5.6 %    Comment:          Prediabetes: 5.7 - 6.4          Diabetes: >6.4          Glycemic control for adults with diabetes: <7.0    Est. average glucose Bld gHb Est-mCnc 126 mg/dL  Lipid Panel w/o Chol/HDL Ratio     Status: Abnormal   Collection Time: 04/27/24  8:49 AM  Result Value Ref Range   Cholesterol, Total 209 (H) 100 - 199 mg/dL   Triglycerides  835 (H) 0 - 149 mg/dL   HDL 45 >60 mg/dL   VLDL Cholesterol Cal 29 5 - 40 mg/dL   LDL Chol Calc (NIH) 864 (H) 0 - 99 mg/dL  [id number]     Status: Abnormal   Collection Time: 04/27/24  8:49 AM  Result Value Ref Range   Glucose 94 70 - 99 mg/dL   BUN 20 8 - 27 mg/dL   Creatinine, Ser 9.22 0.57 - 1.00 mg/dL   eGFR 81 >40 fO/fpw/8.26   BUN/Creatinine Ratio 26 12 - 28   Sodium 135 134 - 144 mmol/L   Potassium 4.5 3.5 - 5.2 mmol/L   Chloride 101 96 - 106 mmol/L   CO2 19 (L) 20 - 29 mmol/L   Calcium  9.2 8.7 - 10.3 mg/dL   Total Protein 7.2 6.0 - 8.5 g/dL   Albumin 4.4 3.8 - 4.8 g/dL   Globulin, Total 2.8 1.5 - 4.5 g/dL   Bilirubin Total 0.5 0.0 - 1.2 mg/dL   Alkaline Phosphatase 76 49 - 135 IU/L   AST 37 0 - 40 IU/L   ALT 37 (H) 0 - 32 IU/L  CBC with Differential/Platelet     Status: Abnormal   Collection Time: 04/27/24  8:49 AM  Result Value Ref Range   WBC 6.5 3.4 - 10.8 x10E3/uL   RBC 4.17 3.77 - 5.28 x10E6/uL   Hemoglobin 12.6 11.1 - 15.9 g/dL   Hematocrit 61.2 65.9 - 46.6 %   MCV 93 79 - 97 fL   MCH 30.2 26.6 - 33.0 pg   MCHC 32.6 31.5 - 35.7 g/dL   RDW 86.9 88.2 - 84.5 %   Platelets 304 150 - 450 x10E3/uL   Neutrophils 36 Not Estab. %   Lymphs 51 Not Estab. %   Monocytes 9 Not Estab. %   Eos 3 Not Estab. %   Basos 1 Not Estab. %   Neutrophils Absolute 2.3 1.4 - 7.0 x10E3/uL   Lymphocytes Absolute 3.3 (H) 0.7 - 3.1 x10E3/uL   Monocytes Absolute 0.6 0.1 - 0.9 x10E3/uL   EOS (ABSOLUTE) 0.2 0.0 - 0.4 x10E3/uL   Basophils Absolute 0.1  0.0 - 0.2 x10E3/uL   Immature Granulocytes 0 Not Estab. %   Immature Grans (Abs) 0.0 0.0 - 0.1 x10E3/uL      Assessment & Plan:  Continue medications as prescribed. Refills sent. Reinforced healthy diet and exercise as tolerated. Completed DNR forms for patient and made copies to add to patients medical record. Bring BP monitor to next appointment. Keep specialists appointments as scheduled. Problem List Items Addressed This Visit      OSA on CPAP   Essential hypertension, benign - Primary   Hyperlipidemia   Hypothyroidism   Lumbar radiculopathy   Relevant Medications   gabapentin  (NEURONTIN ) 300 MG capsule   Neuropathy   Vitamin D  deficiency   Relevant Orders   Vitamin D  (25 hydroxy)   Prediabetes   Statin intolerance   Factor V deficiency (HCC)   Class 1 obesity due to excess calories with serious comorbidity and body mass index (BMI) of 34.0 to 34.9 in adult    Return in about 3 months (around 07/30/2024) for AWV.   Total time spent: 25 minutes. This time includes review of previous notes and results and patient face to face interaction during today's visit.    FERNAND FREDY RAMAN, MD  05/01/2024   This document may have been prepared by [facility] Voice Recognition software and as such may include unintentional dictation errors.

## 2024-05-02 LAB — SPECIMEN STATUS REPORT

## 2024-05-02 LAB — VITAMIN D 25 HYDROXY (VIT D DEFICIENCY, FRACTURES): Vit D, 25-Hydroxy: 36.7 ng/mL (ref 30.0–100.0)

## 2024-05-04 ENCOUNTER — Encounter: Payer: Self-pay | Admitting: Physical Medicine and Rehabilitation

## 2024-05-04 ENCOUNTER — Encounter: Payer: Self-pay | Admitting: Internal Medicine

## 2024-05-25 ENCOUNTER — Encounter: Payer: Self-pay | Admitting: Physical Medicine and Rehabilitation

## 2024-05-31 ENCOUNTER — Other Ambulatory Visit: Payer: Self-pay | Admitting: Internal Medicine

## 2024-05-31 DIAGNOSIS — E782 Mixed hyperlipidemia: Secondary | ICD-10-CM

## 2024-05-31 DIAGNOSIS — Z789 Other specified health status: Secondary | ICD-10-CM

## 2024-06-02 ENCOUNTER — Other Ambulatory Visit: Payer: Self-pay

## 2024-06-02 ENCOUNTER — Emergency Department

## 2024-06-02 ENCOUNTER — Telehealth: Payer: Self-pay | Admitting: Cardiology

## 2024-06-02 ENCOUNTER — Encounter: Payer: Self-pay | Admitting: Emergency Medicine

## 2024-06-02 ENCOUNTER — Emergency Department
Admission: EM | Admit: 2024-06-02 | Discharge: 2024-06-02 | Disposition: A | Discharge status: Home / Self Care | Attending: Emergency Medicine | Admitting: Emergency Medicine

## 2024-06-02 DIAGNOSIS — R0602 Shortness of breath: Secondary | ICD-10-CM | POA: Diagnosis present

## 2024-06-02 LAB — BASIC METABOLIC PANEL WITH GFR
Anion gap: 15 (ref 5–15)
BUN: 17 mg/dL (ref 8–23)
CO2: 21 mmol/L — ABNORMAL LOW (ref 22–32)
Calcium: 9.9 mg/dL (ref 8.9–10.3)
Chloride: 102 mmol/L (ref 98–111)
Creatinine, Ser: 0.71 mg/dL (ref 0.44–1.00)
GFR, Estimated: >60 mL/min
Glucose, Bld: 116 mg/dL — ABNORMAL HIGH (ref 70–99)
Potassium: 3.8 mmol/L (ref 3.5–5.1)
Sodium: 138 mmol/L (ref 135–145)

## 2024-06-02 LAB — CBC
HCT: 38.8 % (ref 36.0–46.0)
Hemoglobin: 13.1 g/dL (ref 12.0–15.0)
MCH: 30.5 pg (ref 26.0–34.0)
MCHC: 33.8 g/dL (ref 30.0–36.0)
MCV: 90.2 fL (ref 80.0–100.0)
Platelets: 349 K/uL (ref 150–400)
RBC: 4.3 MIL/uL (ref 3.87–5.11)
RDW: 12.4 % (ref 11.5–15.5)
WBC: 6.5 K/uL (ref 4.0–10.5)
nRBC: 0 % (ref 0.0–0.2)

## 2024-06-02 LAB — RESP PANEL BY RT-PCR (RSV, FLU A&B, COVID)  RVPGX2
Influenza A by PCR: NEGATIVE
Influenza B by PCR: NEGATIVE
Resp Syncytial Virus by PCR: NEGATIVE
SARS Coronavirus 2 by RT PCR: NEGATIVE

## 2024-06-02 LAB — TROPONIN T, HIGH SENSITIVITY: Troponin T High Sensitivity: <15 ng/L (ref 0–19)

## 2024-06-02 MED ORDER — IOHEXOL 350 MG/ML SOLN
75.0000 mL | Freq: Once | INTRAVENOUS | Status: AC | PRN
  Administered 2024-06-02: 75 mL via INTRAVENOUS

## 2024-06-02 NOTE — ED Notes (Signed)
"  Called lab to add on trop   "

## 2024-06-02 NOTE — Telephone Encounter (Signed)
 Patient would to switch from Dr Darliss to Dr Darron. Please advise

## 2024-06-02 NOTE — ED Triage Notes (Signed)
 Pt reports SOB starting this morning and nausea starting last night that worsened today. Reports chronic pain flare ups and had ketamine infusion 1/2. Denies chest pain.

## 2024-06-02 NOTE — ED Provider Notes (Signed)
 "  [facility] Provider Note    Event Date/Time   First MD Initiated Contact with Patient 06/02/24 [phone]     (approximate)   History   Shortness of Breath and Nausea   HPI  Tonya Green is a 74 y.o. female with history of factor V Leiden, DVT in the distant past who presents with complaints of mild shortness of breath and nausea that she woke up with this morning.  No fevers, no cough.  No calf pain or swelling.  No pleurisy or chest pain     Physical Exam   Triage Vital Signs: ED Triage Vitals [06/02/24 0720]  Encounter Vitals Group     BP (!) 169/80     Girls Systolic BP Percentile      Girls Diastolic BP Percentile      Boys Systolic BP Percentile      Boys Diastolic BP Percentile      Pulse Rate 83     Resp 18     Temp 98.2 F (36.8 C)     Temp Source Oral     SpO2 98 %     Weight 86.4 kg (190 lb 7.6 oz)     Height 1.651 m (5' 5)     Head Circumference      Peak Flow      Pain Score 0     Pain Loc      Pain Education      Exclude from Growth Chart     Most recent vital signs: Vitals:   06/02/24 1000 06/02/24 1025  BP:  (!) 181/79  Pulse: 96 69  Resp: (!) 22 18  Temp:    SpO2: 100% 100%     General: Awake, no distress.  CV:  Good peripheral perfusion.  Resp:  Normal effort.  No tachypnea Abd:  No distention.  Other:     ED Results / Procedures / Treatments   Labs (all labs ordered are listed, but only abnormal results are displayed) Labs Reviewed  BASIC METABOLIC PANEL WITH GFR - Abnormal; Notable for the following components:      Result Value   CO2 21 (*)    Glucose, Bld 116 (*)    All other components within normal limits  RESP PANEL BY RT-PCR (RSV, FLU A&B, COVID)  [id number]  CBC  TROPONIN T, HIGH SENSITIVITY     EKG  ED ECG REPORT I, Lamar Price, the attending physician, personally viewed and interpreted this ECG.  Date: 06/02/2024  Rhythm: normal sinus rhythm QRS Axis: normal Intervals: normal ST/T  Wave abnormalities: normal Narrative Interpretation: Nonspecific changes    RADIOLOGY Chest x-ray viewed interpret by me, no infiltrate    PROCEDURES:  Critical Care performed:   Procedures   MEDICATIONS ORDERED IN ED: Medications  iohexol  (OMNIPAQUE ) 350 MG/ML injection 75 mL (75 mLs Intravenous Contrast Given 06/02/24 0936)     IMPRESSION / MDM / ASSESSMENT AND PLAN / ED COURSE  I reviewed the triage vital signs and the nursing notes. Patient's presentation is most consistent with acute presentation with potential threat to life or bodily function.  Patient presents with shortness of breath as detailed above, she has no chest pain or pleurisy.  Differential includes upper respiratory infection, pneumonia, PE  Lab work is overall reassuring, chest x-ray without evidence of pneumonia.  COVID flu swab pending.  Will send for CT angiography to rule out PE given her history.  CT angiography is negative for PE pneumonia or  edema or effusion  Discussed nodule stability and need for follow-up in 12 months  Patient is well-appearing in no acute distress, ambulatory pulse ox performed by me, heart rate stated 82, oxygen saturation of 97%  Given this, no indication for admission at this time, she will follow-up with cardiology      FINAL CLINICAL IMPRESSION(S) / ED DIAGNOSES   Final diagnoses:  Shortness of breath     Rx / DC Orders   ED Discharge Orders     None        Note:  This document was prepared using Dragon voice recognition software and may include unintentional dictation errors.   Arlander Charleston, MD 06/02/24 1200  "

## 2024-06-03 NOTE — Telephone Encounter (Signed)
 Fine with me

## 2024-06-12 ENCOUNTER — Other Ambulatory Visit: Payer: Self-pay | Admitting: Internal Medicine

## 2024-06-12 DIAGNOSIS — Z1231 Encounter for screening mammogram for malignant neoplasm of breast: Secondary | ICD-10-CM

## 2024-07-02 ENCOUNTER — Other Ambulatory Visit: Payer: Self-pay

## 2024-07-02 DIAGNOSIS — I10 Essential (primary) hypertension: Secondary | ICD-10-CM

## 2024-07-02 MED ORDER — AMLODIPINE BESYLATE 2.5 MG PO TABS: 2.5000 mg | ORAL_TABLET | Freq: Every day | ORAL | 3 refills | Status: AC

## 2024-07-14 ENCOUNTER — Ambulatory Visit: Admitting: Cardiovascular Disease

## 2024-07-17 ENCOUNTER — Encounter: Admitting: Physical Medicine and Rehabilitation

## 2024-07-21 ENCOUNTER — Encounter

## 2024-07-30 ENCOUNTER — Ambulatory Visit: Admitting: Internal Medicine
